# Patient Record
Sex: Female | Born: 1970
Health system: Southern US, Community
[De-identification: ages and names within clinical notes are randomized; demographics above are authoritative.]

## PROBLEM LIST (undated history)

## (undated) DIAGNOSIS — M549 Dorsalgia, unspecified: Secondary | ICD-10-CM

## (undated) DIAGNOSIS — F329 Major depressive disorder, single episode, unspecified: Secondary | ICD-10-CM

## (undated) DIAGNOSIS — M1990 Unspecified osteoarthritis, unspecified site: Secondary | ICD-10-CM

## (undated) DIAGNOSIS — I10 Essential (primary) hypertension: Secondary | ICD-10-CM

## (undated) DIAGNOSIS — Z5189 Encounter for other specified aftercare: Secondary | ICD-10-CM

## (undated) DIAGNOSIS — G8929 Other chronic pain: Secondary | ICD-10-CM

## (undated) DIAGNOSIS — R011 Cardiac murmur, unspecified: Secondary | ICD-10-CM

## (undated) DIAGNOSIS — J189 Pneumonia, unspecified organism: Secondary | ICD-10-CM

## (undated) DIAGNOSIS — K219 Gastro-esophageal reflux disease without esophagitis: Secondary | ICD-10-CM

## (undated) DIAGNOSIS — R7303 Prediabetes: Secondary | ICD-10-CM

## (undated) DIAGNOSIS — G4700 Insomnia, unspecified: Secondary | ICD-10-CM

## (undated) DIAGNOSIS — F32A Depression, unspecified: Secondary | ICD-10-CM

## (undated) DIAGNOSIS — M542 Cervicalgia: Secondary | ICD-10-CM

## (undated) DIAGNOSIS — D649 Anemia, unspecified: Secondary | ICD-10-CM

## (undated) DIAGNOSIS — Z923 Personal history of irradiation: Secondary | ICD-10-CM

## (undated) DIAGNOSIS — Z8719 Personal history of other diseases of the digestive system: Secondary | ICD-10-CM

## (undated) DIAGNOSIS — C50919 Malignant neoplasm of unspecified site of unspecified female breast: Secondary | ICD-10-CM

## (undated) DIAGNOSIS — Z1371 Encounter for nonprocreative screening for genetic disease carrier status: Secondary | ICD-10-CM

## (undated) DIAGNOSIS — M5430 Sciatica, unspecified side: Secondary | ICD-10-CM

## (undated) DIAGNOSIS — F419 Anxiety disorder, unspecified: Secondary | ICD-10-CM

## (undated) DIAGNOSIS — G43909 Migraine, unspecified, not intractable, without status migrainosus: Secondary | ICD-10-CM

## (undated) DIAGNOSIS — G629 Polyneuropathy, unspecified: Secondary | ICD-10-CM

## (undated) HISTORY — DX: Malignant neoplasm of unspecified site of unspecified female breast: C50.919

## (undated) HISTORY — PX: EYE SURGERY: SHX253

## (undated) HISTORY — PX: WISDOM TOOTH EXTRACTION: SHX21

## (undated) HISTORY — PX: OTHER SURGICAL HISTORY: SHX169

## (undated) HISTORY — DX: Migraine, unspecified, not intractable, without status migrainosus: G43.909

## (undated) HISTORY — DX: Encounter for nonprocreative screening for genetic disease carrier status: Z13.71

---

## 2001-04-30 HISTORY — PX: REDUCTION MAMMAPLASTY: SUR839

## 2003-05-01 HISTORY — PX: REFRACTIVE SURGERY: SHX103

## 2003-07-07 ENCOUNTER — Encounter: Admission: RE | Admit: 2003-07-07 | Discharge: 2003-10-05 | Payer: Self-pay | Admitting: *Deleted

## 2006-01-31 ENCOUNTER — Encounter: Admission: RE | Admit: 2006-01-31 | Discharge: 2006-05-01 | Payer: Self-pay | Admitting: Nurse Practitioner

## 2006-04-30 HISTORY — PX: GASTRIC BYPASS: SHX52

## 2006-06-05 ENCOUNTER — Other Ambulatory Visit: Admission: RE | Admit: 2006-06-05 | Discharge: 2006-06-05 | Payer: Self-pay | Admitting: Family Medicine

## 2006-06-25 ENCOUNTER — Ambulatory Visit (HOSPITAL_COMMUNITY): Admission: RE | Admit: 2006-06-25 | Discharge: 2006-06-25 | Payer: Self-pay | Admitting: Interventional Cardiology

## 2006-06-27 ENCOUNTER — Ambulatory Visit (HOSPITAL_COMMUNITY): Admission: RE | Admit: 2006-06-27 | Discharge: 2006-06-27 | Payer: Self-pay | Admitting: Interventional Cardiology

## 2007-07-21 ENCOUNTER — Other Ambulatory Visit: Admission: RE | Admit: 2007-07-21 | Discharge: 2007-07-21 | Payer: Self-pay | Admitting: Family Medicine

## 2010-04-20 ENCOUNTER — Emergency Department (HOSPITAL_BASED_OUTPATIENT_CLINIC_OR_DEPARTMENT_OTHER)
Admission: EM | Admit: 2010-04-20 | Discharge: 2010-04-20 | Payer: Self-pay | Source: Home / Self Care | Admitting: Emergency Medicine

## 2010-04-25 ENCOUNTER — Encounter: Payer: Self-pay | Admitting: Family Medicine

## 2010-04-30 DIAGNOSIS — IMO0001 Reserved for inherently not codable concepts without codable children: Secondary | ICD-10-CM

## 2010-04-30 DIAGNOSIS — Z5189 Encounter for other specified aftercare: Secondary | ICD-10-CM

## 2010-04-30 HISTORY — DX: Reserved for inherently not codable concepts without codable children: IMO0001

## 2010-04-30 HISTORY — DX: Encounter for other specified aftercare: Z51.89

## 2010-05-10 ENCOUNTER — Ambulatory Visit
Admission: RE | Admit: 2010-05-10 | Discharge: 2010-05-10 | Payer: Self-pay | Source: Home / Self Care | Attending: Family Medicine | Admitting: Family Medicine

## 2010-05-10 DIAGNOSIS — R51 Headache: Secondary | ICD-10-CM | POA: Insufficient documentation

## 2010-05-10 DIAGNOSIS — M542 Cervicalgia: Secondary | ICD-10-CM | POA: Insufficient documentation

## 2010-05-10 DIAGNOSIS — G43909 Migraine, unspecified, not intractable, without status migrainosus: Secondary | ICD-10-CM | POA: Insufficient documentation

## 2010-05-10 DIAGNOSIS — R519 Headache, unspecified: Secondary | ICD-10-CM | POA: Insufficient documentation
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
## 2010-06-01 NOTE — Letter (Signed)
Summary: [facility]  [facility]   Imported By: Lanelle Bal 05/23/2010 09:49:28  _____________________________________________________________________  External Attachment:    Type:   Image     Comment:   External Document

## 2010-08-01 ENCOUNTER — Inpatient Hospital Stay (HOSPITAL_COMMUNITY)
Admission: EM | Admit: 2010-08-01 | Discharge: 2010-08-03 | DRG: 812 | Disposition: A | Discharge status: Home / Self Care | Payer: 59 | Attending: Internal Medicine | Admitting: Internal Medicine

## 2010-08-01 ENCOUNTER — Other Ambulatory Visit: Payer: Self-pay | Admitting: Family Medicine

## 2010-08-01 ENCOUNTER — Other Ambulatory Visit (HOSPITAL_COMMUNITY)
Admission: RE | Admit: 2010-08-01 | Discharge: 2010-08-01 | Disposition: A | Discharge status: Home / Self Care | Payer: 59 | Source: Ambulatory Visit | Attending: Family Medicine | Admitting: Family Medicine

## 2010-08-01 DIAGNOSIS — D509 Iron deficiency anemia, unspecified: Principal | ICD-10-CM | POA: Diagnosis present

## 2010-08-01 DIAGNOSIS — N924 Excessive bleeding in the premenopausal period: Secondary | ICD-10-CM | POA: Diagnosis present

## 2010-08-01 DIAGNOSIS — Z124 Encounter for screening for malignant neoplasm of cervix: Secondary | ICD-10-CM | POA: Insufficient documentation

## 2010-08-01 DIAGNOSIS — I10 Essential (primary) hypertension: Secondary | ICD-10-CM | POA: Diagnosis present

## 2010-08-01 DIAGNOSIS — D473 Essential (hemorrhagic) thrombocythemia: Secondary | ICD-10-CM | POA: Diagnosis present

## 2010-08-01 DIAGNOSIS — R87810 Cervical high risk human papillomavirus (HPV) DNA test positive: Secondary | ICD-10-CM | POA: Insufficient documentation

## 2010-08-01 DIAGNOSIS — Z113 Encounter for screening for infections with a predominantly sexual mode of transmission: Secondary | ICD-10-CM | POA: Insufficient documentation

## 2010-08-01 LAB — IRON AND TIBC: UIBC: 239 ug/dL

## 2010-08-01 LAB — DIFFERENTIAL
Basophils Absolute: 0 10*3/uL (ref 0.0–0.1)
Basophils Relative: 0 % (ref 0–1)
Eosinophils Absolute: 0 10*3/uL (ref 0.0–0.7)
Eosinophils Relative: 0 % (ref 0–5)
Lymphs Abs: 2.2 10*3/uL (ref 0.7–4.0)
Monocytes Relative: 10 % (ref 3–12)
Neutro Abs: 2.5 10*3/uL (ref 1.7–7.7)

## 2010-08-01 LAB — PROTIME-INR
INR: 1.13 (ref 0.00–1.49)
Prothrombin Time: 14.7 seconds (ref 11.6–15.2)

## 2010-08-01 LAB — CBC
Hemoglobin: 4 g/dL — CL (ref 12.0–15.0)
MCV: 58 fL — ABNORMAL LOW (ref 78.0–100.0)
WBC: 5.2 10*3/uL (ref 4.0–10.5)

## 2010-08-01 LAB — VITAMIN B12: Vitamin B-12: 341 pg/mL (ref 211–911)

## 2010-08-01 LAB — COMPREHENSIVE METABOLIC PANEL
Albumin: 3.6 g/dL (ref 3.5–5.2)
BUN: 8 mg/dL (ref 6–23)
CO2: 24 mEq/L (ref 19–32)
Chloride: 106 mEq/L (ref 96–112)
Creatinine, Ser: 0.68 mg/dL (ref 0.4–1.2)
GFR calc Af Amer: >60 mL/min (ref >60)
Sodium: 137 mEq/L (ref 135–145)

## 2010-08-01 LAB — FOLATE: Folate: 12.8 ng/mL

## 2010-08-01 LAB — FERRITIN: Ferritin: 2 ng/mL — ABNORMAL LOW (ref 10–291)

## 2010-08-02 ENCOUNTER — Inpatient Hospital Stay (HOSPITAL_COMMUNITY): Payer: 59

## 2010-08-02 LAB — CBC
HCT: 29.9 % — ABNORMAL LOW (ref 36.0–46.0)
MCH: 19.7 pg — ABNORMAL LOW (ref 26.0–34.0)
MCHC: 28.4 g/dL — ABNORMAL LOW (ref 30.0–36.0)
MCV: 69.4 fL — ABNORMAL LOW (ref 78.0–100.0)
Platelets: 975 10*3/uL (ref 150–400)

## 2010-08-02 LAB — DIFFERENTIAL
Basophils Relative: 0 % (ref 0–1)
Eosinophils Relative: 0 % (ref 0–5)

## 2010-08-02 LAB — IRON AND TIBC
Saturation Ratios: 3 % — ABNORMAL LOW (ref 20–55)
UIBC: 428 ug/dL

## 2010-08-02 LAB — TSH: TSH: 0.507 u[IU]/mL (ref 0.350–4.500)

## 2010-08-02 LAB — FERRITIN: Ferritin: 2 ng/mL — ABNORMAL LOW (ref 10–291)

## 2010-08-02 LAB — VITAMIN B12: Vitamin B-12: 339 pg/mL (ref 211–911)

## 2010-08-02 MED ORDER — IOHEXOL 300 MG/ML  SOLN
100.0000 mL | Freq: Once | INTRAMUSCULAR | Status: AC | PRN
  Administered 2010-08-02: 100 mL via INTRAVENOUS

## 2010-08-03 LAB — CBC
Hemoglobin: 7.6 g/dL — ABNORMAL LOW (ref 12.0–15.0)
MCH: 19.4 pg — ABNORMAL LOW (ref 26.0–34.0)
MCHC: 28.4 g/dL — ABNORMAL LOW (ref 30.0–36.0)
Platelets: 826 10*3/uL — ABNORMAL HIGH (ref 150–400)

## 2010-08-03 LAB — DIFFERENTIAL
Basophils Absolute: 0 10*3/uL (ref 0.0–0.1)
Basophils Relative: 0 % (ref 0–1)
Eosinophils Absolute: 0.1 10*3/uL (ref 0.0–0.7)
Lymphocytes Relative: 48 % — ABNORMAL HIGH (ref 12–46)
Monocytes Absolute: 0.5 10*3/uL (ref 0.1–1.0)
Neutro Abs: 2.5 10*3/uL (ref 1.7–7.7)
Neutrophils Relative %: 43 % (ref 43–77)

## 2010-08-03 LAB — COMPREHENSIVE METABOLIC PANEL
ALT: 25 U/L (ref 0–35)
CO2: 27 mEq/L (ref 19–32)
Calcium: 8.4 mg/dL (ref 8.4–10.5)
Chloride: 105 mEq/L (ref 96–112)
Creatinine, Ser: 0.81 mg/dL (ref 0.4–1.2)
GFR calc non Af Amer: >60 mL/min (ref >60)
Glucose, Bld: 85 mg/dL (ref 70–99)
Total Bilirubin: 0.5 mg/dL (ref 0.3–1.2)

## 2010-08-03 LAB — PREPARE RBC (CROSSMATCH)

## 2010-08-03 LAB — HEMOGLOBIN AND HEMATOCRIT, BLOOD: Hemoglobin: 8.8 g/dL — ABNORMAL LOW (ref 12.0–15.0)

## 2010-08-03 LAB — MAGNESIUM: Magnesium: 2.1 mg/dL (ref 1.5–2.5)

## 2010-08-03 NOTE — Discharge Summary (Signed)
NAMESHANEIKA, ROSSA NO.:  [id number]  MEDICAL RECORD NO.:  [id number]           PATIENT TYPE:  I  LOCATION:  1309                         FACILITY:  [facility]  PHYSICIAN:  Talmage Nap, MD  DATE OF BIRTH:  04-Jun-1970  DATE OF ADMISSION:  08/01/2010 DATE OF DISCHARGE:  08/03/2010                        DISCHARGE SUMMARY - REFERRING   PRIMARY CARE PHYSICIAN:  [name], MD  DISCHARGE DIAGNOSES: 1. Microcytic anemia status post multiple packed red blood cells     transfusion. 2. History of menorrhagia.  Differential:     a.     Uterine fibroid.     b.     Dysfunctional uterine bleed.     c.     Hemoglobinopathy.     d.     Von Willebrand disease. 3. Thrombocytosis most likely secondary to anemia. 4. Hypertension. 5. Migraine headaches. 6. Allergies.  HISTORY:  The patient is a 40 year old African American female with history of menorrhagia who has been put by the gynecologist on birth control pills, was admitted to the hospital on August 01, 2010 by Dr. Homero Fellers because the patient on routine blood work was found to have a hemoglobin of about 4 g.  She was also said to have complained about lightheadedness and being weak.  There was no history of chest pain.  There was no history of shortness of breath.  She denies any history of hematochezia, hematemesis or melanotic stool, and she also denies any history of bleeding per vagina.  She was however sent to the hospital for blood transfusion and further workup.  Her preadmission meds include Zyrtec, Singulair and amitriptyline.  ALLERGIES:  She has no known allergy.  SOCIAL HISTORY:  Negative for alcohol or tobacco use.  FAMILY HISTORY:  [place] Spaniel to be positive for diabetes mellitus and CVA.  REVIEW OF SYSTEMS:  Essentially documented in the initial history and physical.  PHYSICAL EXAMINATION:  GENERAL:  At the time the patient was seen by the admitting physician, blood pressure was 142/83,  pulse 84, respiratory rate 20, temperature was 98.2 and she was said to be saturating 100% on room air. HEENT:  Pallor but pupils are reactive to light and extraocular muscles are intact. NECK:  No jugular venous distention.  No carotid bruit.  No lymphadenopathy. RESPIRATORY:  Chest is clear to auscultation. CARDIAC:  Heart sounds are 1 and 2. ABDOMEN:  Soft and nontender.  Liver, spleen and kidney not palpable. Bowel sounds are positive. EXTREMITIES:  No pedal edema. NEUROLOGIC:  Nonfocal. MUSCULOSKELETAL:  Unremarkable. SKIN:  Showed normal turgor.  LABORATORY DATA:  Initial fecal occult blood test was negative. Comprehensive metabolic panel showed sodium of 137, potassium of 3.7, chloride of 106 with a bicarb of 24, glucose is 91, BUN is 8, creatinine 0.68.  LFTs normal.  Complete blood count showed WBC of 5.2, hemoglobin of 4.0, hematocrit of 16.4, MCV of 58.0 with platelet count of 1173. LDH is 126, normal.  Coagulation profile showed PT 14.7, INR 1.13 and APTT of 30.  Anemia panel showed serum iron less than 10, UIBC 239 and normal.  Vitamin B12 341  and folate is 12.8 and normal.  Retic percent 31.4 and absolute reticulocyte count is 43.4 and normal.  TSH 0.507, normal.  Vitamin B12 339.  Ferritin level is 2, it is low.  Pathology smear review showed marked microcytic anemia with mild thrombocytosis. Repeat complete blood count with differential done on August 18, 2010 showed WBC of 5.7, hemoglobin 7.6, hematocrit 26.8, MCV of 68.5 with platelet count of 826.  Magnesium level is 2.1 and comprehensive metabolic panel shows sodium of 130, potassium of 3.5, chloride of 130, bicarb of 27, glucose is 85, BUN is 9, creatinine 0.81.  Imaging studies done include CT of the abdomen and pelvis which showed 3.7-cm cyst within the left ovary, which likely represented a function of ovarian cyst.  Endometrium said to be ill defined likely related to menses and there is mild periportal  edema likely secondary to IV resuscitation and cleft-like lesion within the liver, questionable trauma.  HOSPITAL COURSE:  The patient was admitted to general medical floor. She was typed and crossed, transfused with 4 units of packed RBCs and given Lasix 40 mg IV in between transfusion.  She was also given Pepcid 20 mg p.o. daily and hydrochlorothiazide 12.5 mg p.o. daily for blood pressure control.  The patient was also given Ambien 5 mg p.o. q.h.s. p.r.n. for insomnia.  The patient was however seen by me for the very first time in this index admission on August 02, 2010 and during this encounter she denied any lightheadedness.  No fever, no chills, no rigor.  She claims she is mildly weak, and examination of the patient was essentially unremarkable.  The patient was subsequently started on ferrous sulfate 325 mg p.o. b.i.d.  She was reevaluated by me today and complained about mild lightheadedness.  No chest pain.  No shortness of breath.  Denied any hematochezia or melena.  No bleeding per vagina, and she wants to go home today.  Examination of the patient was essentially unremarkable.  Her vital signs blood pressure is 117/73, pulse 50, respiratory rate 14, temperature is 98.2.  The patient is medically stable.  Plan is for the patient to be discharged home today after transfusion with 1 unit of packed RBCs and post transfusion hemoglobin and hematocrit done.  Diet will be low sodium, low cholesterol.  She was advised to follow up with her OB/GYN, and the patient states she will make her own appointment.  OB-GYN is to consider possible hysteroscopy plus biopsy.  DISCHARGE MEDICATIONS:  Medications to be taken at home will include: 1. Ferrous sulfate 325 mg 1 p.o. t.i.d. 2. Hydrochlorothiazide 12.5 mg 1 p.o. daily. 3. Advil 200 mg p.o. q. 6 h. p.r.n. for pain. 4. Amitriptyline 25 mg 1 p.o. daily at bedtime for migraine headaches. 5. Flonase (fluticasone propionate) nasal spray, 1  spray in both     nostrils daily p.r.n. 6. Singulair (montelukast) 10 mg 1 p.o. daily. 7. Zyrtec (cetirizine) 10 mg 1 p.o. daily at bedtime.     Talmage Nap, MD     CN/MEDQ  D:  08/03/2010  T:  08/03/2010  Job:  [id number]  cc:   [name], MD Fax: [phone]  Electronically Signed by Talmage Nap  on 08/03/2010 04:21:20 PM

## 2010-08-04 LAB — TYPE AND SCREEN
ABO/RH(D): O NEG
Antibody Screen: NEGATIVE
Unit division: 0
Unit division: 0
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
## 2010-08-25 ENCOUNTER — Other Ambulatory Visit: Payer: Self-pay | Admitting: Obstetrics and Gynecology

## 2010-08-25 ENCOUNTER — Other Ambulatory Visit (HOSPITAL_COMMUNITY)
Admission: RE | Admit: 2010-08-25 | Discharge: 2010-08-25 | Disposition: A | Discharge status: Home / Self Care | Payer: 59 | Source: Ambulatory Visit | Attending: Obstetrics and Gynecology | Admitting: Obstetrics and Gynecology

## 2010-08-25 ENCOUNTER — Encounter (INDEPENDENT_AMBULATORY_CARE_PROVIDER_SITE_OTHER): Payer: 59 | Admitting: Obstetrics and Gynecology

## 2010-08-25 DIAGNOSIS — N72 Inflammatory disease of cervix uteri: Secondary | ICD-10-CM | POA: Insufficient documentation

## 2010-08-25 DIAGNOSIS — R87610 Atypical squamous cells of undetermined significance on cytologic smear of cervix (ASC-US): Secondary | ICD-10-CM

## 2010-08-25 DIAGNOSIS — N879 Dysplasia of cervix uteri, unspecified: Secondary | ICD-10-CM | POA: Insufficient documentation

## 2010-09-22 ENCOUNTER — Ambulatory Visit: Payer: 59 | Admitting: Obstetrics and Gynecology

## 2010-09-22 DIAGNOSIS — R87610 Atypical squamous cells of undetermined significance on cytologic smear of cervix (ASC-US): Secondary | ICD-10-CM

## 2010-09-22 NOTE — Group Therapy Note (Signed)
NAME:  Tammie [name], Tammie [name] NO.:  [id number]  MEDICAL RECORD NO.:  [id number]           PATIENT TYPE:  A  LOCATION:  [facility]                   FACILITY:  [facility]  PHYSICIAN:  Catalina Antigua, MD     DATE OF BIRTH:  December 14, 1970  DATE OF SERVICE:  09/22/2010                                 CLINIC NOTE  This is a 40 year old who presents today to discuss the results of her colposcopy.  The patient had an abnormal Pap smear of ASCUS positive HPV in April 2012 followed by colposcopy performed in August 25, 2010, which showed acute and chronic cervicitis and focal koilocytic atypia, ECC had benign endocervical glands.  The patient was informed of these findings, and plan is to repeat the Pap smear in 6 months.  The patient also reports a history of heavy vaginal bleeding lasting 11 days.  The patient is currently being managed by her primary care physician with a monophasic birth control pills.  Discussion of other options, such as Mirena IUD or endometrial ablation also discussed.  The patient is more interested in the Mirena IUD information packet was provided.  The patient was also informed that with her history of hypertension continued use of her oral contraceptive pills is associated with coronary artery disease.  The patient verbalized understanding and understands that her OCPs will need to be discontinued.  The patient will therefore return for a repeat Pap smear in 6 months and IUD insertion.          ______________________________ Catalina Antigua, MD    PC/MEDQ  D:  09/22/2010  T:  09/22/2010  Job:  [id number]

## 2010-10-05 ENCOUNTER — Other Ambulatory Visit: Payer: Self-pay | Admitting: Obstetrics & Gynecology

## 2010-10-05 ENCOUNTER — Other Ambulatory Visit (HOSPITAL_COMMUNITY)
Admission: RE | Admit: 2010-10-05 | Discharge: 2010-10-05 | Disposition: A | Discharge status: Home / Self Care | Payer: 59 | Source: Ambulatory Visit | Attending: Obstetrics & Gynecology | Admitting: Obstetrics & Gynecology

## 2010-10-05 ENCOUNTER — Ambulatory Visit: Payer: 59 | Admitting: Obstetrics and Gynecology

## 2010-10-05 ENCOUNTER — Other Ambulatory Visit: Payer: Self-pay | Admitting: Obstetrics and Gynecology

## 2010-10-05 DIAGNOSIS — Z30430 Encounter for insertion of intrauterine contraceptive device: Secondary | ICD-10-CM

## 2010-10-05 DIAGNOSIS — N949 Unspecified condition associated with female genital organs and menstrual cycle: Secondary | ICD-10-CM | POA: Insufficient documentation

## 2010-10-05 DIAGNOSIS — N938 Other specified abnormal uterine and vaginal bleeding: Secondary | ICD-10-CM | POA: Insufficient documentation

## 2010-10-05 DIAGNOSIS — D649 Anemia, unspecified: Secondary | ICD-10-CM

## 2010-10-05 LAB — POCT PREGNANCY, URINE: Preg Test, Ur: NEGATIVE

## 2010-10-06 NOTE — Group Therapy Note (Signed)
NAME:  Tammie [name], Tammie [name] NO.:  [id number]  MEDICAL RECORD NO.:  [id number]           PATIENT TYPE:  A  LOCATION:  [facility]                   FACILITY:  [facility]  PHYSICIAN:  Allie Bossier, MD        DATE OF BIRTH:  February 12, 1971  DATE OF SERVICE:  10/05/2010                                 CLINIC NOTE  Ms. Bellows is a 40 year old African American gravida 1, para 1 with a daughter who has graduated from high school [employer]).  She comes in today for treatment of her dysfunctional uterine bleeding.  Please note that she has a history of dysfunctional uterine bleeding, which has led to blood transfusion.  She says her hemoglobin has gotten as low as 4, but with the help of transfusions and iron, she says that she is recently up to 9.  She has been evaluated in the past with a CT done August 02, 2010, that showed a 3.7-cm simple left ovarian cyst, and her endometrium was described as ill-defined.  There were no other abnormalities noted on that CT.  On exam today, uterus is upper limits of normal for size, not particularly tender, relatively globular consistent with adenomyosis.  So today, I have consented her for an endometrial biopsy as well as the Mirena IUD placement.  After bimanual exam, I prepped the cervix with iodine and sounded her uterus to 8 cm. I did an endometrial Pipelle biopsy with 2 passes, and I obtained a moderate to large amount of tissue.  I placed a Mirena IUD and cut the strings to approximately 3 cm.  She tolerated the procedure well.  She was given 800 of Motrin, and she will follow up in 4 weeks for an IUD string check.     Allie Bossier, MD    MCD/MEDQ  D:  10/05/2010  T:  10/06/2010  Job:  [id number]

## 2010-10-20 ENCOUNTER — Other Ambulatory Visit: Payer: 59

## 2010-10-20 DIAGNOSIS — Z0189 Encounter for other specified special examinations: Secondary | ICD-10-CM

## 2010-11-03 ENCOUNTER — Ambulatory Visit: Payer: 59 | Admitting: Obstetrics and Gynecology

## 2010-11-03 DIAGNOSIS — Z30431 Encounter for routine checking of intrauterine contraceptive device: Secondary | ICD-10-CM

## 2010-11-03 NOTE — Group Therapy Note (Unsigned)
NAME:  Tammie [name], Tammie [name] NO.:  [id number]  MEDICAL RECORD NO.:  [id number]           PATIENT TYPE:  A  LOCATION:  [facility]                   FACILITY:  [facility]  PHYSICIAN:  Georges Mouse, CNM   DATE OF BIRTH:  01-Jan-1971  DATE OF SERVICE:  11/03/2010                                 CLINIC NOTE  She was seen in the GYN Clinic today with a chief complaint of IUD string check.  Ms. Lineberry was seen in clinic today for an IUD string checked that was placed by Dr. Marice Potter on October 05, 2010, for dysfunctional uterine bleeding.  The patient reports ongoing heavy and long menstrual bleeding since the insertion reporting 28 days of bleeding most recently.  She also reports mild occasional left pelvic pain and requests a review of the results from her Pap smear, colposcopy and endometrial biopsies.  PAST MEDICAL HISTORY:  No change from previous visits with a history of severe anemia requiring transfusion, which insertion of IUD.  GYN HISTORY:  She has last Pap in March 2012, showed ascus and positive HPV, colposcopy.  Her last colposcopy on April 2012 showed inflammation and chronic cervicitis.  Dr. Jolayne Panther reviewed and recommended to follow up Pap in 6 months from that time.  EMB showed no atypia or hyperplasia.  MEDICATIONS:  See the patient list.  ALLERGIES:  No known diagnosed allergies to drugs.  OBJECTIVE:  GENERAL:  Pleasant, well-spoken African American female appearing her stated age and in no acute distress. VITAL SIGNS:  Temperature 97.9, pulse 77, blood pressure 116/79, weight 168.9 pounds, height 5.6 and three-quarters. SKIN:  Warm and dry to the touch.  No signs of petechiae or ecchymoses on the extremities. NECK:  Trachea is midline. ABDOMEN:  Soft, nondistended, nontender to touch. PELVIC:  External genitalia without lesion or cyst.  Rugae pink without lesions.  No discharge.  IUD string visualized about 3 cm and coming out of the cervical  os. NEUROLOGIC:  Alert and oriented x4, pleasant and energetic mood and affect.  The patient states "I am ready to be normal again."  Normal coordination and speech.  ASSESSMENT:  A 40 year old African American female G1, P1 with IUD in place for dysfunctional uterine bleeding.  PLAN: 1. The patient to follow up in 3 months for repeat Pap and     reassessment of abnormal bleeding. 2. Reviewed normal side effects of IUD placement including irregular     bleeding for the first 3-4 months with the patient. 3. Advised the patient to report to the MAU for any excessive     menstrual reviewed bleeding and to call the office is concerned     about the amount of heavy or prolonged bleeding if it continuous. 4. Continue iron supplementation. 5. Reviewed the results of previous procedures with the patient.          ______________________________ Georges Mouse, CNM    NF/MEDQ  D:  11/03/2010  T:  11/03/2010  Job:  [id number]

## 2011-01-29 ENCOUNTER — Ambulatory Visit: Payer: 59 | Admitting: Family Medicine

## 2011-01-30 ENCOUNTER — Encounter (HOSPITAL_COMMUNITY): Payer: Self-pay

## 2011-01-30 ENCOUNTER — Encounter (HOSPITAL_COMMUNITY)
Admission: RE | Admit: 2011-01-30 | Discharge: 2011-01-30 | Disposition: A | Discharge status: Home / Self Care | Payer: 59 | Source: Ambulatory Visit | Attending: Obstetrics and Gynecology | Admitting: Obstetrics and Gynecology

## 2011-01-30 ENCOUNTER — Other Ambulatory Visit: Payer: Self-pay

## 2011-01-30 HISTORY — DX: Anemia, unspecified: D64.9

## 2011-01-30 HISTORY — DX: Cardiac murmur, unspecified: R01.1

## 2011-01-30 HISTORY — DX: Encounter for other specified aftercare: Z51.89

## 2011-01-30 HISTORY — DX: Essential (primary) hypertension: I10

## 2011-01-30 LAB — CBC
HCT: 33.4 % — ABNORMAL LOW (ref 36.0–46.0)
Hemoglobin: 10.4 g/dL — ABNORMAL LOW (ref 12.0–15.0)
MCH: 23.6 pg — ABNORMAL LOW (ref 26.0–34.0)
MCHC: 31.1 g/dL (ref 30.0–36.0)
MCV: 75.7 fL — ABNORMAL LOW (ref 78.0–100.0)
RDW: 14.2 % (ref 11.5–15.5)

## 2011-01-30 LAB — BASIC METABOLIC PANEL
BUN: 14 mg/dL (ref 6–23)
CO2: 24 mEq/L (ref 19–32)
Calcium: 8.7 mg/dL (ref 8.4–10.5)
Chloride: 103 mEq/L (ref 96–112)
Creatinine, Ser: 0.66 mg/dL (ref 0.50–1.10)
Glucose, Bld: 87 mg/dL (ref 70–99)

## 2011-01-30 NOTE — Patient Instructions (Signed)
   Your procedure is scheduled on: Wed 01/31/11  Enter through the Main Entrance of [facility] at:9 am Pick up the phone at the desk and dial [phone] and inform us of your arrival  Please call this number if you have any problems the morning of surgery: [phone]  Remember: Do not eat food after midnight tonight Do not drink clear liquids after:tonight Take these medicines the morning of surgery with a SIP OF WATER: BP med  Do not wear jewelry, make-up, or FINGER nail polish Do not wear lotions, powders, or perfumes.  You may wear deodorant. Do not shave 48 hours prior to surgery. Do not bring valuables to the hospital. Leave suitcase in the car. After Surgery it may be brought to your room. For patients being admitted to the hospital, checkout time is 11:00am the day of discharge.  Patients discharged on the day of surgery will not be allowed to drive home.   Name and phone number of your driver: sisterVeneda [name]- [phone]    Remember to use your hibiclens as instructed.Please shower with 1/2 bottle the evening before your surgery and the other 1/2 bottle the morning of surgery.

## 2011-01-31 ENCOUNTER — Other Ambulatory Visit: Payer: Self-pay | Admitting: Obstetrics and Gynecology

## 2011-01-31 ENCOUNTER — Encounter (HOSPITAL_COMMUNITY)
Admission: RE | Disposition: A | Discharge status: Home / Self Care | Payer: Self-pay | Source: Ambulatory Visit | Attending: Obstetrics and Gynecology

## 2011-01-31 ENCOUNTER — Inpatient Hospital Stay (HOSPITAL_COMMUNITY): Payer: 59

## 2011-01-31 ENCOUNTER — Ambulatory Visit (HOSPITAL_COMMUNITY)
Admission: RE | Admit: 2011-01-31 | Discharge: 2011-02-01 | Disposition: A | Discharge status: Home / Self Care | Payer: 59 | Source: Ambulatory Visit | Attending: Obstetrics and Gynecology | Admitting: Obstetrics and Gynecology

## 2011-01-31 ENCOUNTER — Encounter (HOSPITAL_COMMUNITY): Payer: Self-pay

## 2011-01-31 DIAGNOSIS — N920 Excessive and frequent menstruation with regular cycle: Principal | ICD-10-CM | POA: Insufficient documentation

## 2011-01-31 DIAGNOSIS — D500 Iron deficiency anemia secondary to blood loss (chronic): Secondary | ICD-10-CM | POA: Insufficient documentation

## 2011-01-31 DIAGNOSIS — Z01812 Encounter for preprocedural laboratory examination: Secondary | ICD-10-CM | POA: Insufficient documentation

## 2011-01-31 DIAGNOSIS — Z90710 Acquired absence of both cervix and uterus: Secondary | ICD-10-CM

## 2011-01-31 DIAGNOSIS — Z01818 Encounter for other preprocedural examination: Secondary | ICD-10-CM | POA: Insufficient documentation

## 2011-01-31 HISTORY — PX: VAGINAL HYSTERECTOMY: SHX2639

## 2011-01-31 LAB — PREGNANCY, URINE: Preg Test, Ur: NEGATIVE

## 2011-01-31 LAB — TYPE AND SCREEN: ABO/RH(D): O NEG

## 2011-01-31 SURGERY — HYSTERECTOMY, VAGINAL
Anesthesia: General | Site: Vagina | Wound class: Clean Contaminated

## 2011-01-31 MED ORDER — EPHEDRINE SULFATE 50 MG/ML IJ SOLN
INTRAMUSCULAR | Status: DC | PRN
  Administered 2011-01-31: 10 mg via INTRAVENOUS

## 2011-01-31 MED ORDER — VASOPRESSIN 20 UNIT/ML IJ SOLN
INTRAVENOUS | Status: DC | PRN
  Administered 2011-01-31: 11:00:00 via INTRAMUSCULAR

## 2011-01-31 MED ORDER — DEXAMETHASONE SODIUM PHOSPHATE 10 MG/ML IJ SOLN
INTRAMUSCULAR | Status: AC
  Filled 2011-01-31: qty 1

## 2011-01-31 MED ORDER — MORPHINE SULFATE 4 MG/ML IJ SOLN
INTRAMUSCULAR | Status: AC
  Administered 2011-01-31: 2 mg via INTRAVENOUS
  Filled 2011-01-31: qty 1

## 2011-01-31 MED ORDER — GLYCOPYRROLATE 0.2 MG/ML IJ SOLN
INTRAMUSCULAR | Status: DC | PRN
  Administered 2011-01-31 (×2): 0.2 mg via INTRAVENOUS

## 2011-01-31 MED ORDER — MORPHINE SULFATE (PF) 1 MG/ML IV SOLN
INTRAVENOUS | Status: DC
  Administered 2011-01-31: 21 mg via INTRAVENOUS
  Administered 2011-01-31: 15 mL via INTRAVENOUS
  Administered 2011-01-31 (×2): 25 mg via INTRAVENOUS
  Administered 2011-02-01: 1 mg via INTRAVENOUS
  Administered 2011-02-01: 2 mg via INTRAVENOUS
  Filled 2011-01-31 (×2): qty 25

## 2011-01-31 MED ORDER — ROCURONIUM BROMIDE 50 MG/5ML IV SOLN
INTRAVENOUS | Status: AC
  Filled 2011-01-31: qty 1

## 2011-01-31 MED ORDER — ONDANSETRON HCL 4 MG/2ML IJ SOLN
INTRAMUSCULAR | Status: AC
  Filled 2011-01-31: qty 2

## 2011-01-31 MED ORDER — PROPOFOL 10 MG/ML IV EMUL
INTRAVENOUS | Status: AC
  Filled 2011-01-31: qty 20

## 2011-01-31 MED ORDER — DIPHENHYDRAMINE HCL 12.5 MG/5ML PO ELIX: 12.5000 mg | ORAL_SOLUTION | Freq: Four times a day (QID) | ORAL | Status: DC | PRN

## 2011-01-31 MED ORDER — LIDOCAINE HCL (CARDIAC) 20 MG/ML IV SOLN
INTRAVENOUS | Status: DC | PRN
  Administered 2011-01-31: 20 mg via INTRAVENOUS

## 2011-01-31 MED ORDER — GLYCOPYRROLATE 0.2 MG/ML IJ SOLN
INTRAMUSCULAR | Status: AC
  Filled 2011-01-31: qty 1

## 2011-01-31 MED ORDER — MIDAZOLAM HCL 2 MG/2ML IJ SOLN
INTRAMUSCULAR | Status: AC
  Filled 2011-01-31: qty 2

## 2011-01-31 MED ORDER — CEFAZOLIN SODIUM 1-5 GM-% IV SOLN
INTRAVENOUS | Status: AC
  Filled 2011-01-31: qty 50

## 2011-01-31 MED ORDER — MIDAZOLAM HCL 5 MG/5ML IJ SOLN
INTRAMUSCULAR | Status: DC | PRN
  Administered 2011-01-31: 2 mg via INTRAVENOUS

## 2011-01-31 MED ORDER — MORPHINE SULFATE 4 MG/ML IJ SOLN
1.0000 mg | INTRAMUSCULAR | Status: DC | PRN
  Administered 2011-01-31 (×2): 2 mg via INTRAVENOUS

## 2011-01-31 MED ORDER — FENTANYL CITRATE 0.05 MG/ML IJ SOLN
INTRAMUSCULAR | Status: AC
  Filled 2011-01-31: qty 5

## 2011-01-31 MED ORDER — NALOXONE HCL 0.4 MG/ML IJ SOLN: 0.4000 mg | INTRAMUSCULAR | Status: DC | PRN

## 2011-01-31 MED ORDER — DIPHENHYDRAMINE HCL 50 MG/ML IJ SOLN: 12.5000 mg | Freq: Four times a day (QID) | INTRAMUSCULAR | Status: DC | PRN

## 2011-01-31 MED ORDER — DEXAMETHASONE SODIUM PHOSPHATE 4 MG/ML IJ SOLN
INTRAMUSCULAR | Status: DC | PRN
  Administered 2011-01-31: 10 mg via INTRAVENOUS

## 2011-01-31 MED ORDER — FENTANYL CITRATE 0.05 MG/ML IJ SOLN
INTRAMUSCULAR | Status: DC | PRN
  Administered 2011-01-31: 100 ug via INTRAVENOUS
  Administered 2011-01-31: 50 ug via INTRAVENOUS

## 2011-01-31 MED ORDER — PROPOFOL 10 MG/ML IV EMUL
INTRAVENOUS | Status: DC | PRN
  Administered 2011-01-31: 200 mg via INTRAVENOUS

## 2011-01-31 MED ORDER — ONDANSETRON HCL 4 MG/2ML IJ SOLN
INTRAMUSCULAR | Status: DC | PRN
  Administered 2011-01-31: 4 mg via INTRAVENOUS

## 2011-01-31 MED ORDER — KETOROLAC TROMETHAMINE 30 MG/ML IJ SOLN
INTRAMUSCULAR | Status: DC | PRN
  Administered 2011-01-31: 30 mg via INTRAVENOUS

## 2011-01-31 MED ORDER — KETOROLAC TROMETHAMINE 30 MG/ML IJ SOLN
INTRAMUSCULAR | Status: AC
  Filled 2011-01-31: qty 1

## 2011-01-31 MED ORDER — LACTATED RINGERS IV SOLN
INTRAVENOUS | Status: DC
  Administered 2011-01-31 (×2): via INTRAVENOUS

## 2011-01-31 MED ORDER — LACTATED RINGERS IV SOLN
INTRAVENOUS | Status: DC
  Administered 2011-01-31 – 2011-02-01 (×2): via INTRAVENOUS

## 2011-01-31 MED ORDER — EPHEDRINE 5 MG/ML INJ
INTRAVENOUS | Status: AC
  Filled 2011-01-31: qty 10

## 2011-01-31 MED ORDER — MICROFIBRILLAR COLL HEMOSTAT EX POWD
CUTANEOUS | Status: DC | PRN
  Administered 2011-01-31: 1 g via TOPICAL

## 2011-01-31 MED ORDER — ONDANSETRON HCL 4 MG/2ML IJ SOLN
4.0000 mg | Freq: Four times a day (QID) | INTRAMUSCULAR | Status: DC | PRN
  Administered 2011-01-31: 4 mg via INTRAVENOUS
  Filled 2011-01-31: qty 2

## 2011-01-31 MED ORDER — CEFAZOLIN SODIUM 1-5 GM-% IV SOLN
1.0000 g | INTRAVENOUS | Status: AC
  Administered 2011-01-31: 1 g via INTRAVENOUS

## 2011-01-31 MED ORDER — SODIUM CHLORIDE 0.9 % IJ SOLN: 9.0000 mL | INTRAMUSCULAR | Status: DC | PRN

## 2011-01-31 MED ORDER — LIDOCAINE HCL (CARDIAC) 20 MG/ML IV SOLN
INTRAVENOUS | Status: AC
  Filled 2011-01-31: qty 5

## 2011-01-31 SURGICAL SUPPLY — 21 items
CANISTER SUCTION 2500CC (MISCELLANEOUS) ×2 IMPLANT
CLOTH BEACON ORANGE TIMEOUT ST (SAFETY) ×2 IMPLANT
CONT PATH 16OZ SNAP LID 3702 (MISCELLANEOUS) IMPLANT
DRAPE STERI URO 9X17 APER PCH (DRAPES) ×2 IMPLANT
DRAPE UTILITY XL STRL (DRAPES) ×2 IMPLANT
ELECT BLADE 6 FLAT ULTRCLN (ELECTRODE) ×2 IMPLANT
GLOVE BIO SURGEON STRL SZ 6.5 (GLOVE) ×2 IMPLANT
GLOVE BIO SURGEON STRL SZ7 (GLOVE) ×2 IMPLANT
GLOVE BIOGEL PI IND STRL 7.0 (GLOVE) ×1 IMPLANT
GLOVE BIOGEL PI INDICATOR 7.0 (GLOVE) ×1
GOWN PREVENTION PLUS LG XLONG (DISPOSABLE) ×6 IMPLANT
GOWN PREVENTION PLUS XLARGE (GOWN DISPOSABLE) IMPLANT
NS IRRIG 1000ML POUR BTL (IV SOLUTION) ×2 IMPLANT
PACK VAGINAL WOMENS (CUSTOM PROCEDURE TRAY) ×2 IMPLANT
SUT VIC AB 0 CT1 18XCR BRD8 (SUTURE) ×2 IMPLANT
SUT VIC AB 0 CT1 36 (SUTURE) ×4 IMPLANT
SUT VIC AB 0 CT1 8-18 (SUTURE) ×2
SUT VICRYL 0 TIES 12 18 (SUTURE) ×2 IMPLANT
TOWEL OR 17X24 6PK STRL BLUE (TOWEL DISPOSABLE) ×4 IMPLANT
TRAY FOLEY CATH 14FR (SET/KITS/TRAYS/PACK) ×2 IMPLANT
WATER STERILE IRR 1000ML POUR (IV SOLUTION) ×2 IMPLANT

## 2011-01-31 NOTE — Transfer of Care (Signed)
Immediate Anesthesia Transfer of Care Note  Patient: Tammie [name]  Procedure(s) Performed:  HYSTERECTOMY VAGINAL  Patient Location: PACU  Anesthesia Type: General  Level of Consciousness: awake, alert  and oriented  Airway & Oxygen Therapy: Patient Spontanous Breathing and Patient connected to nasal cannula oxygen  Post-op Assessment: Report given to PACU RN and Post -op Vital signs reviewed and stable  Post vital signs: Reviewed and stable  Complications: No apparent anesthesia complications

## 2011-01-31 NOTE — Anesthesia Preprocedure Evaluation (Signed)
Anesthesia Evaluation  Name, MR# and DOB Patient awake  General Assessment Comment  Reviewed: Allergy & Precautions, H&P , Patient's Chart, lab work & pertinent test results, reviewed documented beta blocker date and time   Airway Mallampati: II TM Distance: >3 FB and <3 FB Neck ROM: full   Comment: slightly small chin and TM distance. Dental No notable dental hx.    Pulmonary  clear to auscultation  Pulmonary exam normal       Cardiovascular hypertension (on meds, well controlled), On Medications Valvular problems/murmurs: no signif Murmur noted on PE. regular Normal    Neuro/Psych    GI/Hepatic   Endo/Other    Renal/GU      Musculoskeletal   Abdominal   Peds  Hematology   Anesthesia Other Findings   Reproductive/Obstetrics                           Anesthesia Physical Anesthesia Plan  ASA: II  Anesthesia Plan: General   Post-op Pain Management:    Induction: Intravenous  Airway Management Planned: LMA  Additional Equipment:   Intra-op Plan:   Post-operative Plan:   Informed Consent: I have reviewed the patients History and Physical, chart, labs and discussed the procedure including the risks, benefits and alternatives for the proposed anesthesia with the patient or authorized representative who has indicated his/her understanding and acceptance.   Dental Advisory Given  Plan Discussed with: CRNA and Surgeon  Anesthesia Plan Comments: (  Discussed  general anesthesia, including possible nausea, instrumentation of airway, sore throat,pulmonary aspiration, etc. I asked if the were any outstanding questions, or  concerns before we proceeded. )        Anesthesia Quick Evaluation

## 2011-01-31 NOTE — Anesthesia Procedure Notes (Signed)
Procedure Name: LMA Insertion Date/Time: 01/31/2011 10:34 AM Performed by: Lincoln Brigham Pre-anesthesia Checklist: Patient identified, Emergency Drugs available, Suction available and Patient being monitored Patient Re-evaluated:Patient Re-evaluated prior to inductionOxygen Delivery Method: Circle System Utilized Preoxygenation: Pre-oxygenation with 100% oxygen Intubation Type: IV induction Ventilation: Mask ventilation without difficulty LMA: LMA inserted LMA Size: 4.0

## 2011-01-31 NOTE — H&P (Signed)
No changes to H&P.

## 2011-01-31 NOTE — Op Note (Signed)
Tammie Tammie [name], Tammie [name] NO.:  [id number]  MEDICAL RECORD NO.:  [id number]  LOCATION:  9317                          FACILITY:  [facility]  PHYSICIAN:  Pieter Partridge, MD   DATE OF BIRTH:  [birth year]  DATE OF PROCEDURE:  01/31/2011 DATE OF DISCHARGE:                              OPERATIVE REPORT   PROCEDURE:  Total vaginal hysterectomy.  PREOPERATIVE DIAGNOSES:  Menometrorrhagia and anemia.  POSTOPERATIVE DIAGNOSES:  Menometrorrhagia and anemia.  SURGEON:  Pieter Partridge, MD.  ASSISTANT:  Dr. Richardson Dopp and technician.  ANESTHESIA:  General (LMA).  IV FLUIDS:  1000.  URINE OUT:  600.  BLOOD:  100.  No blood administered.  Foley catheter in place.  LOCAL MEDICATION:  Pitressin.  SPECIMEN:  Uterus with cervix.  DISPOSITION OF SPECIMEN:  To path.  COUNTS CORRECT:  Yes.  The patient is stable to PACU.  PROCEDURE IN DETAIL:  Tammie Tammie [name] was identified in the holding area. She was then taken to the operating room with IV running.  She was placed in the dorsal supine position and underwent LMA anesthesia without complication.  She was then placed in the dorsal lithotomy position and prepped and draped in a normal sterile fashion with Betadine.  A weighted speculum was placed in the vagina.  The cervix was grasped with two single-tooth tenaculum.  Pitressin was injected paracervically. The Bovie cautery was then used to transect the cervix from the vaginal mucosa.  The cervix was displaced anteriorly and I attempted to open up the posterior fascia and I started to dig into the uterus.  The Allis was used to grasp the uterine tissue and then I got into the posterior cul- de-sac without difficulty.  No fluid noted.  The long weighted speculum was then inserted.  The patient was previously placed in Trendelenburg as well.  I then directed my attention to the anterior bladder reflection and that was incised with Mayo scissors and adequate plane was developed.   Peritoneum was incised easily without difficulty and the anterior cul-de-sac was opened.  Bladder was not entered.  Prior to opening the anterior cul-de-sac, the uterosacrals were grasped on each side with a Heaney and suture ligated with a 0 Vicryl stitch and held, and then after that first tie was taken, the anterior cul-de-sac was opened.  The parametrial tissue was then skeletonized with Heaneys and 0 Vicryl to pop-off until the uterine fundus was reached and the utero-ovarian ligament was transected.  I attempted to flip the uterus was a single-tooth tenaculum and replaced in the Lahey clamps onto the cervix, but there was some limited mobility, so the [facility] were then used to grasp the cornu and that was transected.  Each apex was suture ligated with a zero freehand tie first and then Heaney stitch for hemostasis.  On the patient's left-hand side, it appeared a little oozing.  The peritoneum was reapproximated and observed.  Because of just a little wet, I did apply some Avitene to the area.  Both ovaries and tubes were evaluated and they appeared to be normal.  All instruments were removed from the vagina.  The long Allis clamps were used to grasp the  vagina.  Anchor stitches were placed on both angles and the uterosacral stitch was then tied to the close running vaginal stitch on a zero switched on needle.  A second layer were 2-0 Vicryl was used for imbrication.  Of note, on the posterior vagina, there was some bleeding noted and so that was made hemostatic by placing a running zero Vicryl deep or lower down on the vagina instead of right below the vaginal cuff.  The vagina was copiously irrigated and all instruments were removed.  Of note, there was a laparotomy sponge that was placed because the bowel was noted at the uterine fundus that was removed prior to closure.  The patient tolerated the procedure well.  All instrument, sponge, and needle counts were correct x3.  She  was awakened and taken to the recovery room in stable condition.     Pieter Partridge, MD     EBV/MEDQ  D:  01/31/2011  T:  01/31/2011  Job:  [id number]

## 2011-01-31 NOTE — Anesthesia Postprocedure Evaluation (Signed)
  Anesthesia Post-op Note  Patient: Tammie [name]  Procedure(s) Performed:  HYSTERECTOMY VAGINAL Patient is awake and responsive. Pain and nausea are reasonably well controlled. Vital signs are stable and clinically acceptable. Oxygen saturation is clinically acceptable. There are no apparent anesthetic complications at this time. Patient is ready for discharge.
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
## 2011-01-31 NOTE — Brief Op Note (Signed)
01/31/2011  12:14 PM  PATIENT:  Tammie [name]  40 y.o. female  PRE-OPERATIVE DIAGNOSIS:  Menometrorrhagia; Anemia  POST-OPERATIVE DIAGNOSIS:  Menometrorrhagia; Anemia  PROCEDURE:  Procedure(s): HYSTERECTOMY VAGINAL  SURGEON:  Surgeon(s): Geryl Rankins, MD Dorien Chihuahua. Cole  PHYSICIAN ASSISTANT: Dr. Richardson Dopp  ASSISTANTS: Technician   ANESTHESIA:   general  (LMA)  OR FLUID I/O:  Total I/O In: 1000 [I.V.:1000] Out: 700 [Urine:600; Blood:100]  BLOOD ADMINISTERED:none  DRAINS: Urinary Catheter (Foley)   LOCAL MEDICATIONS USED:  OTHER Putressin  SPECIMEN:  Source of Specimen:  Uterus w/ cervix  DISPOSITION OF SPECIMEN:  PATHOLOGY  COUNTS:  YES  TOURNIQUET:  * No tourniquets in log *  DICTATION: .Other Dictation: Dictation Number [id number]  PLAN OF CARE: Admit for overnight observation  PATIENT DISPOSITION:  PACU - hemodynamically stable.   Delay start of Pharmacological VTE agent (>24hrs) due to surgical blood loss or risk of bleeding:  not applicable

## 2011-02-01 ENCOUNTER — Encounter (HOSPITAL_COMMUNITY): Payer: Self-pay | Admitting: Obstetrics and Gynecology

## 2011-02-01 LAB — CREATININE, SERUM
Creatinine, Ser: 0.78 mg/dL (ref 0.50–1.10)
GFR calc Af Amer: >90 mL/min (ref >90)
GFR calc non Af Amer: >90 mL/min (ref >90)

## 2011-02-01 LAB — CBC
MCV: 75.5 fL — ABNORMAL LOW (ref 78.0–100.0)
Platelets: 219 10*3/uL (ref 150–400)
RBC: 3.87 MIL/uL (ref 3.87–5.11)
RDW: 14.4 % (ref 11.5–15.5)
WBC: 14 10*3/uL — ABNORMAL HIGH (ref 4.0–10.5)

## 2011-02-01 LAB — BUN: BUN: 6 mg/dL (ref 6–23)

## 2011-02-01 MED ORDER — HYDROCODONE-ACETAMINOPHEN 5-325 MG PO TABS: 1.0000 | ORAL_TABLET | Freq: Four times a day (QID) | ORAL | Status: DC | PRN

## 2011-02-01 MED ORDER — IBUPROFEN 800 MG PO TABS: 800.0000 mg | ORAL_TABLET | Freq: Three times a day (TID) | ORAL | Status: DC | PRN

## 2011-02-01 MED ORDER — OXYCODONE-ACETAMINOPHEN 5-325 MG PO TABS
1.0000 | ORAL_TABLET | ORAL | Status: DC | PRN
Start: 2011-02-01 — End: 2011-02-01
  Administered 2011-02-01: 2 via ORAL
  Filled 2011-02-01: qty 2

## 2011-02-01 MED ORDER — IBUPROFEN 800 MG PO TABS: 800.0000 mg | ORAL_TABLET | Freq: Three times a day (TID) | ORAL | Status: AC | PRN

## 2011-02-01 MED ORDER — HYDROCODONE-ACETAMINOPHEN 5-500 MG PO TABS: 1.0000 | ORAL_TABLET | Freq: Four times a day (QID) | ORAL | Status: AC | PRN

## 2011-02-01 MED ORDER — DIPHENHYDRAMINE HCL 25 MG PO CAPS
25.0000 mg | ORAL_CAPSULE | Freq: Four times a day (QID) | ORAL | Status: DC | PRN
  Administered 2011-02-01: 25 mg via ORAL
  Filled 2011-02-01: qty 1

## 2011-02-01 NOTE — Progress Notes (Signed)
1 Day Post-Op Procedure(s): HYSTERECTOMY VAGINAL  Subjective: Patient reports tolerating PO.  Itching that started after Percocet given.  Pt states her pain is well controlled now.  Left lower back pain post op, better now.  Objective: I have reviewed patient's vital signs and intake and output.  General: alert and cooperative.  Scratching constantly CV: RRR Lungs: CTA bilaterally Abdomen: appropriately tender, L>R Ext: WNL Hg 9.2  Assessment: s/p Procedure(s): HYSTERECTOMY VAGINAL: progressing well  Plan: Discharge home Benedryl for itching now.  Discharge home with Vicoden instead of Percocet. BUN/Cr prior to discharge. F/u in 2 weeks.   LOS: 1 day    Mazikeen Hehn 02/01/2011, 12:50 PM

## 2011-02-01 NOTE — Discharge Summary (Signed)
Physician Discharge Summary  Patient ID: Tammie [name] MRN: [id number] DOB/AGE: [birth year] 40 y.o.  Admit date: 01/31/2011 Discharge date: 02/01/2011  Admission Diagnoses:  Menometrorrhagia, Anemia   Discharge Diagnoses: Same Active Problems:  * No active hospital problems. *    Discharged Condition: good  Hospital Course: Uncomplicated.  Diet advanced as tolerated.  Pain well controlled.  Itching with Percocet-Benadryl given. Left lower back pain, BUN/Cr checked prior to discharge.  Consults: none  Significant Diagnostic Studies: labs: Hg (pre-op) 10.4 to 9.2 (post-op)  Treatments: surgery: [facility] 01/31/11  Discharge Exam: Blood pressure 103/66, pulse 81, temperature 98.6 F (37 C), temperature source Oral, resp. rate 18, height 5\' 7"  (1.702 m), weight 72.576 kg (160 lb), last menstrual period 01/21/2011, SpO2 99.00%. Physical exam WNL  Disposition: Home or Self Care  Discharge Orders    Future Orders Please Complete By Expires   Diet - low sodium heart healthy      Increase activity slowly      Discharge instructions      Comments:   Routine   Driving Restrictions      Comments:   No driving while using narcotics   Lifting restrictions      Comments:   No lifting greater than 15 pounds.   Sexual Activity Restrictions      Comments:   Nothing in vagina x 6 weeks.   No wound care      Call MD for:  temperature >100.4      Scheduling Instructions:   If temperature greater than 101.0.   Call MD for:  persistant nausea and vomiting      Call MD for:  severe uncontrolled pain      Call MD for:  difficulty breathing, headache or visual disturbances      Call MD for:  persistant dizziness or light-headedness        Current Discharge Medication List    START taking these medications   Details  HYDROcodone-acetaminophen (VICODIN) 5-500 MG per tablet Take 1-2 tablets by mouth every 6 (six) hours as needed for pain (every 4-6 hours prn pain). Qty: 30 tablet, Refills: 0      ibuprofen (ADVIL,MOTRIN) 800 MG tablet Take 1 tablet (800 mg total) by mouth every 8 (eight) hours as needed for pain. Qty: 30 tablet, Refills: 1      CONTINUE these medications which have NOT CHANGED   Details  ferrous sulfate 325 (65 FE) MG tablet Take 325 mg by mouth 3 (three) times daily with meals.      hydrochlorothiazide (HYDRODIURIL) 12.5 MG tablet Take 12.5 mg by mouth daily.      OVER THE COUNTER MEDICATION Take 1 capsule by mouth daily. Pt takes a homeopathic medication called Stinging Nettle.        Follow-up Information    Follow up with Geryl Rankins, MD. Make an appointment in 2 weeks.   Contact information:   [facility] [address] [place] [address] [phone]          Signed: Geryl Rankins 02/01/2011, 1:15 PM

## 2011-04-02 ENCOUNTER — Other Ambulatory Visit: Payer: Self-pay | Admitting: Obstetrics and Gynecology

## 2011-04-02 DIAGNOSIS — Z1231 Encounter for screening mammogram for malignant neoplasm of breast: Secondary | ICD-10-CM

## 2011-04-27 ENCOUNTER — Ambulatory Visit: Payer: 59

## 2011-08-10 ENCOUNTER — Encounter (HOSPITAL_COMMUNITY): Payer: Self-pay | Admitting: Pharmacist

## 2011-08-13 ENCOUNTER — Encounter (HOSPITAL_COMMUNITY): Payer: Self-pay

## 2011-08-13 ENCOUNTER — Encounter (HOSPITAL_COMMUNITY)
Admission: RE | Admit: 2011-08-13 | Discharge: 2011-08-13 | Disposition: A | Discharge status: Home / Self Care | Payer: PRIVATE HEALTH INSURANCE | Source: Ambulatory Visit | Attending: Obstetrics and Gynecology | Admitting: Obstetrics and Gynecology

## 2011-08-13 HISTORY — DX: Sciatica, unspecified side: M54.30

## 2011-08-13 HISTORY — DX: Other chronic pain: G89.29

## 2011-08-13 HISTORY — DX: Cervicalgia: M54.2

## 2011-08-13 HISTORY — DX: Dorsalgia, unspecified: M54.9

## 2011-08-13 HISTORY — DX: Unspecified osteoarthritis, unspecified site: M19.90

## 2011-08-13 HISTORY — DX: Insomnia, unspecified: G47.00

## 2011-08-13 LAB — BASIC METABOLIC PANEL
CO2: 29 mEq/L (ref 19–32)
Chloride: 103 mEq/L (ref 96–112)
GFR calc non Af Amer: 87 mL/min — ABNORMAL LOW (ref >90)
Glucose, Bld: 96 mg/dL (ref 70–99)
Potassium: 3.7 mEq/L (ref 3.5–5.1)
Sodium: 139 mEq/L (ref 135–145)

## 2011-08-13 LAB — SURGICAL PCR SCREEN: MRSA, PCR: NEGATIVE

## 2011-08-13 LAB — CBC
HCT: 38.8 % (ref 36.0–46.0)
Hemoglobin: 12.2 g/dL (ref 12.0–15.0)
MCH: 24.2 pg — ABNORMAL LOW (ref 26.0–34.0)
RBC: 5.05 MIL/uL (ref 3.87–5.11)

## 2011-08-13 NOTE — Pre-Procedure Instructions (Signed)
Reviewed patient's history/medications with Dr Arby Barrette.  Ok to use 01/2011 EKG for this surgery per Dr Arby Barrette.

## 2011-08-13 NOTE — Patient Instructions (Signed)
   Your procedure is scheduled on: Monday, April 22nd  Enter through the Main Entrance of [facility] at:12:30pm Pick up the phone at the desk and dial [phone] and inform us of your arrival.  Please call this number if you have any problems the morning of surgery: [phone]  Remember: Do not eat food after midnight: Sunday Do not drink clear liquids after: 10:00am Monday Take these medicines the morning of surgery with a SIP OF WATER: atenolol-chlorthalidone   Do not wear jewelry, make-up, or FINGER nail polish Do not wear lotions, powders, perfumes or deodorant. Do not shave 48 hours prior to surgery. Do not bring valuables to the hospital. Contacts, dentures or bridgework may not be worn into surgery.  Patients discharged on the day of surgery will not be allowed to drive home.  Patient to arrange a ride home prior to day of surgery.  Patient informed and understands that she can not drive home after surgery due to anesthesia.   Remember to use your hibiclens as instructed.Please shower with 1/2 bottle the evening before your surgery and the other 1/2 bottle the morning of surgery. Neck down avoiding private area.

## 2011-08-13 NOTE — Pre-Procedure Instructions (Signed)
[name] in Lab informed of patient's history of blood transfusion in 2012 at [facility].

## 2011-08-14 ENCOUNTER — Other Ambulatory Visit: Payer: Self-pay | Admitting: Obstetrics and Gynecology

## 2011-08-15 ENCOUNTER — Other Ambulatory Visit (HOSPITAL_COMMUNITY): Payer: 59

## 2011-08-16 NOTE — Pre-Procedure Instructions (Signed)
Informed patient of surgery time change. Instructed to arrive at 11:30.  Patton Salles RN

## 2011-08-19 ENCOUNTER — Other Ambulatory Visit: Payer: Self-pay | Admitting: Obstetrics and Gynecology
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
## 2011-08-20 ENCOUNTER — Encounter (HOSPITAL_COMMUNITY)
Admission: RE | Disposition: A | Discharge status: Home / Self Care | Payer: Self-pay | Source: Ambulatory Visit | Attending: Obstetrics and Gynecology

## 2011-08-20 ENCOUNTER — Encounter (HOSPITAL_COMMUNITY): Payer: Self-pay | Admitting: Anesthesiology

## 2011-08-20 ENCOUNTER — Encounter (HOSPITAL_COMMUNITY): Payer: Self-pay | Admitting: *Deleted

## 2011-08-20 ENCOUNTER — Observation Stay (HOSPITAL_COMMUNITY)
Admission: RE | Admit: 2011-08-20 | Discharge: 2011-08-21 | Disposition: A | Discharge status: Home / Self Care | Payer: PRIVATE HEALTH INSURANCE | Source: Ambulatory Visit | Attending: Obstetrics and Gynecology | Admitting: Obstetrics and Gynecology

## 2011-08-20 ENCOUNTER — Encounter (HOSPITAL_COMMUNITY): Payer: Self-pay | Admitting: General Practice

## 2011-08-20 ENCOUNTER — Ambulatory Visit (HOSPITAL_COMMUNITY): Payer: PRIVATE HEALTH INSURANCE | Admitting: Anesthesiology

## 2011-08-20 DIAGNOSIS — Z90721 Acquired absence of ovaries, unilateral: Secondary | ICD-10-CM

## 2011-08-20 DIAGNOSIS — N9489 Other specified conditions associated with female genital organs and menstrual cycle: Principal | ICD-10-CM | POA: Insufficient documentation

## 2011-08-20 DIAGNOSIS — N80109 Endometriosis of ovary, unspecified side, unspecified depth: Secondary | ICD-10-CM | POA: Insufficient documentation

## 2011-08-20 DIAGNOSIS — N736 Female pelvic peritoneal adhesions (postinfective): Secondary | ICD-10-CM | POA: Insufficient documentation

## 2011-08-20 DIAGNOSIS — N949 Unspecified condition associated with female genital organs and menstrual cycle: Secondary | ICD-10-CM | POA: Insufficient documentation

## 2011-08-20 DIAGNOSIS — R1032 Left lower quadrant pain: Secondary | ICD-10-CM | POA: Insufficient documentation

## 2011-08-20 DIAGNOSIS — N801 Endometriosis of ovary: Secondary | ICD-10-CM | POA: Insufficient documentation

## 2011-08-20 DIAGNOSIS — Z01818 Encounter for other preprocedural examination: Secondary | ICD-10-CM | POA: Insufficient documentation

## 2011-08-20 DIAGNOSIS — N2889 Other specified disorders of kidney and ureter: Secondary | ICD-10-CM | POA: Insufficient documentation

## 2011-08-20 DIAGNOSIS — Z01812 Encounter for preprocedural laboratory examination: Secondary | ICD-10-CM | POA: Insufficient documentation

## 2011-08-20 HISTORY — PX: SALPINGOOPHORECTOMY: SHX82

## 2011-08-20 HISTORY — PX: LAPAROSCOPY: SHX197

## 2011-08-20 LAB — TYPE AND SCREEN: Antibody Screen: NEGATIVE

## 2011-08-20 SURGERY — LAPAROSCOPY OPERATIVE
Anesthesia: General | Site: Abdomen | Wound class: Clean Contaminated

## 2011-08-20 MED ORDER — BUPIVACAINE HCL (PF) 0.25 % IJ SOLN
INTRAMUSCULAR | Status: AC
  Filled 2011-08-20: qty 30

## 2011-08-20 MED ORDER — KETOROLAC TROMETHAMINE 30 MG/ML IJ SOLN
INTRAMUSCULAR | Status: AC
  Filled 2011-08-20: qty 1

## 2011-08-20 MED ORDER — HYDROMORPHONE HCL PF 1 MG/ML IJ SOLN
0.2000 mg | INTRAMUSCULAR | Status: DC | PRN
  Administered 2011-08-20 – 2011-08-21 (×2): 0.6 mg via INTRAVENOUS
  Filled 2011-08-20 (×2): qty 1

## 2011-08-20 MED ORDER — FENTANYL CITRATE 0.05 MG/ML IJ SOLN
INTRAMUSCULAR | Status: DC | PRN
  Administered 2011-08-20: 25 ug via INTRAVENOUS
  Administered 2011-08-20: 50 ug via INTRAVENOUS
  Administered 2011-08-20: 25 ug via INTRAVENOUS
  Administered 2011-08-20: 100 ug via INTRAVENOUS
  Administered 2011-08-20 (×3): 50 ug via INTRAVENOUS

## 2011-08-20 MED ORDER — GLYCOPYRROLATE 0.2 MG/ML IJ SOLN
INTRAMUSCULAR | Status: DC | PRN
  Administered 2011-08-20: 0.4 mg via INTRAVENOUS
  Administered 2011-08-20: 0.2 mg via INTRAVENOUS
  Administered 2011-08-20: .2 mg via INTRAVENOUS

## 2011-08-20 MED ORDER — MEPERIDINE HCL 25 MG/ML IJ SOLN: 6.2500 mg | INTRAMUSCULAR | Status: DC | PRN

## 2011-08-20 MED ORDER — ONDANSETRON HCL 4 MG PO TABS: 4.0000 mg | ORAL_TABLET | Freq: Four times a day (QID) | ORAL | Status: DC | PRN

## 2011-08-20 MED ORDER — NEOSTIGMINE METHYLSULFATE 1 MG/ML IJ SOLN
INTRAMUSCULAR | Status: AC
  Filled 2011-08-20: qty 10

## 2011-08-20 MED ORDER — DEXAMETHASONE SODIUM PHOSPHATE 10 MG/ML IJ SOLN
INTRAMUSCULAR | Status: DC | PRN
  Administered 2011-08-20: 10 mg via INTRAVENOUS

## 2011-08-20 MED ORDER — BUPIVACAINE HCL (PF) 0.25 % IJ SOLN
INTRAMUSCULAR | Status: DC | PRN
  Administered 2011-08-20: 30 mL

## 2011-08-20 MED ORDER — METOCLOPRAMIDE HCL 5 MG/ML IJ SOLN: 10.0000 mg | Freq: Once | INTRAMUSCULAR | Status: DC | PRN

## 2011-08-20 MED ORDER — ONDANSETRON HCL 4 MG/2ML IJ SOLN
INTRAMUSCULAR | Status: AC
  Filled 2011-08-20: qty 2

## 2011-08-20 MED ORDER — HYDROMORPHONE HCL PF 1 MG/ML IJ SOLN
INTRAMUSCULAR | Status: AC
  Administered 2011-08-20: 1 mg via INTRAVENOUS
  Filled 2011-08-20: qty 1

## 2011-08-20 MED ORDER — GLYCOPYRROLATE 0.2 MG/ML IJ SOLN
INTRAMUSCULAR | Status: AC
  Filled 2011-08-20: qty 2

## 2011-08-20 MED ORDER — MIDAZOLAM HCL 2 MG/2ML IJ SOLN
INTRAMUSCULAR | Status: AC
  Filled 2011-08-20: qty 2

## 2011-08-20 MED ORDER — OXYCODONE-ACETAMINOPHEN 5-325 MG PO TABS: 1.0000 | ORAL_TABLET | ORAL | Status: DC | PRN

## 2011-08-20 MED ORDER — LIDOCAINE HCL (CARDIAC) 20 MG/ML IV SOLN
INTRAVENOUS | Status: DC | PRN
  Administered 2011-08-20: 60 mg via INTRAVENOUS

## 2011-08-20 MED ORDER — MIDAZOLAM HCL 5 MG/5ML IJ SOLN
INTRAMUSCULAR | Status: DC | PRN
  Administered 2011-08-20: 2 mg via INTRAVENOUS

## 2011-08-20 MED ORDER — SIMETHICONE 80 MG PO CHEW
80.0000 mg | CHEWABLE_TABLET | Freq: Four times a day (QID) | ORAL | Status: DC | PRN
Start: 2011-08-20 — End: 2011-08-21

## 2011-08-20 MED ORDER — LACTATED RINGERS IV SOLN: INTRAVENOUS | Status: DC

## 2011-08-20 MED ORDER — 0.9 % SODIUM CHLORIDE (POUR BTL) OPTIME
TOPICAL | Status: DC | PRN
  Administered 2011-08-20: 1000 mL

## 2011-08-20 MED ORDER — NEOSTIGMINE METHYLSULFATE 1 MG/ML IJ SOLN
INTRAMUSCULAR | Status: DC | PRN
  Administered 2011-08-20: 2 mg via INTRAVENOUS

## 2011-08-20 MED ORDER — CEFAZOLIN SODIUM 1-5 GM-% IV SOLN
INTRAVENOUS | Status: AC
  Administered 2011-08-20: 1 g via INTRAVENOUS
  Filled 2011-08-20: qty 50

## 2011-08-20 MED ORDER — ONDANSETRON HCL 4 MG/2ML IJ SOLN: 4.0000 mg | Freq: Four times a day (QID) | INTRAMUSCULAR | Status: DC | PRN

## 2011-08-20 MED ORDER — MENTHOL 3 MG MT LOZG: 1.0000 | LOZENGE | OROMUCOSAL | Status: DC | PRN

## 2011-08-20 MED ORDER — LACTATED RINGERS IV SOLN
INTRAVENOUS | Status: DC | PRN
  Administered 2011-08-20 (×4): via INTRAVENOUS

## 2011-08-20 MED ORDER — KETOROLAC TROMETHAMINE 30 MG/ML IJ SOLN
15.0000 mg | Freq: Once | INTRAMUSCULAR | Status: AC | PRN
  Administered 2011-08-20: 30 mg via INTRAVENOUS

## 2011-08-20 MED ORDER — ATENOLOL-CHLORTHALIDONE 50-25 MG PO TABS: 1.0000 | ORAL_TABLET | Freq: Every morning | ORAL | Status: DC

## 2011-08-20 MED ORDER — HYDROMORPHONE HCL PF 1 MG/ML IJ SOLN
INTRAMUSCULAR | Status: AC
  Filled 2011-08-20: qty 1

## 2011-08-20 MED ORDER — FENTANYL CITRATE 0.05 MG/ML IJ SOLN
INTRAMUSCULAR | Status: AC
  Filled 2011-08-20: qty 2

## 2011-08-20 MED ORDER — ATENOLOL 50 MG PO TABS
50.0000 mg | ORAL_TABLET | Freq: Every day | ORAL | Status: DC
  Administered 2011-08-21: 50 mg via ORAL
  Filled 2011-08-20 (×2): qty 1

## 2011-08-20 MED ORDER — ONDANSETRON HCL 4 MG/2ML IJ SOLN
INTRAMUSCULAR | Status: DC | PRN
  Administered 2011-08-20: 4 mg via INTRAVENOUS

## 2011-08-20 MED ORDER — PROPOFOL 10 MG/ML IV EMUL
INTRAVENOUS | Status: DC | PRN
  Administered 2011-08-20: 130 mg via INTRAVENOUS

## 2011-08-20 MED ORDER — IBUPROFEN 600 MG PO TABS
600.0000 mg | ORAL_TABLET | Freq: Four times a day (QID) | ORAL | Status: DC | PRN
  Administered 2011-08-21: 600 mg via ORAL
  Filled 2011-08-20: qty 1

## 2011-08-20 MED ORDER — HYDROMORPHONE HCL PF 1 MG/ML IJ SOLN
0.5000 mg | INTRAMUSCULAR | Status: DC | PRN
  Administered 2011-08-20: 1 mg via INTRAVENOUS
  Administered 2011-08-20: 0.5 mg via INTRAVENOUS

## 2011-08-20 MED ORDER — LACTATED RINGERS IV SOLN
INTRAVENOUS | Status: DC
  Administered 2011-08-20 – 2011-08-21 (×2): via INTRAVENOUS

## 2011-08-20 MED ORDER — EPHEDRINE SULFATE 50 MG/ML IJ SOLN
INTRAMUSCULAR | Status: DC | PRN
  Administered 2011-08-20 (×2): 10 mg via INTRAVENOUS

## 2011-08-20 MED ORDER — DEXAMETHASONE SODIUM PHOSPHATE 10 MG/ML IJ SOLN
INTRAMUSCULAR | Status: AC
  Filled 2011-08-20: qty 1

## 2011-08-20 MED ORDER — CHLORTHALIDONE 25 MG PO TABS
25.0000 mg | ORAL_TABLET | Freq: Every day | ORAL | Status: DC
  Administered 2011-08-21: 25 mg via ORAL
  Filled 2011-08-20 (×2): qty 1

## 2011-08-20 MED ORDER — FENTANYL CITRATE 0.05 MG/ML IJ SOLN
INTRAMUSCULAR | Status: AC
  Filled 2011-08-20: qty 5

## 2011-08-20 MED ORDER — LIDOCAINE HCL (CARDIAC) 20 MG/ML IV SOLN
INTRAVENOUS | Status: AC
  Filled 2011-08-20: qty 5

## 2011-08-20 MED ORDER — EPHEDRINE 5 MG/ML INJ
INTRAVENOUS | Status: AC
  Filled 2011-08-20: qty 10

## 2011-08-20 MED ORDER — DOCUSATE SODIUM 100 MG PO CAPS
100.0000 mg | ORAL_CAPSULE | Freq: Two times a day (BID) | ORAL | Status: DC
  Administered 2011-08-21: 100 mg via ORAL
  Filled 2011-08-20: qty 1

## 2011-08-20 MED ORDER — ROCURONIUM BROMIDE 50 MG/5ML IV SOLN
INTRAVENOUS | Status: AC
  Filled 2011-08-20: qty 1

## 2011-08-20 MED ORDER — ROCURONIUM BROMIDE 100 MG/10ML IV SOLN
INTRAVENOUS | Status: DC | PRN
  Administered 2011-08-20: 30 mg via INTRAVENOUS
  Administered 2011-08-20: 10 mg via INTRAVENOUS
  Administered 2011-08-20 (×2): 5 mg via INTRAVENOUS

## 2011-08-20 MED ORDER — CEFAZOLIN SODIUM 1-5 GM-% IV SOLN: 1.0000 g | INTRAVENOUS | Status: DC

## 2011-08-20 MED ORDER — LACTATED RINGERS IR SOLN
Status: DC | PRN
  Administered 2011-08-20: 3000 mL

## 2011-08-20 MED ORDER — PROPOFOL 10 MG/ML IV EMUL
INTRAVENOUS | Status: AC
  Filled 2011-08-20: qty 20

## 2011-08-20 SURGICAL SUPPLY — 34 items
BARRIER ADHS 3X4 INTERCEED (GAUZE/BANDAGES/DRESSINGS) ×6 IMPLANT
BENZOIN TINCTURE PRP APPL 2/3 (GAUZE/BANDAGES/DRESSINGS) IMPLANT
CHLORAPREP W/TINT 26ML (MISCELLANEOUS) ×3 IMPLANT
CLOTH BEACON ORANGE TIMEOUT ST (SAFETY) ×3 IMPLANT
DERMABOND ADVANCED (GAUZE/BANDAGES/DRESSINGS) ×1
DERMABOND ADVANCED .7 DNX12 (GAUZE/BANDAGES/DRESSINGS) ×2 IMPLANT
DISSECTOR BLUNT TIP ENDO 5MM (MISCELLANEOUS) ×6 IMPLANT
FORCEP GYRUS CUTTING 5MMX33CM (CUTTING FORCEPS) IMPLANT
FORCEPS CUTTING 33CM 5MM (CUTTING FORCEPS) ×3 IMPLANT
GLOVE BIO SURGEON STRL SZ7 (GLOVE) ×9 IMPLANT
GLOVE BIO SURGEON STRL SZ7.5 (GLOVE) ×3 IMPLANT
GLOVE BIOGEL M 6.5 STRL (GLOVE) ×3 IMPLANT
GLOVE BIOGEL PI IND STRL 7.0 (GLOVE) ×6 IMPLANT
GLOVE BIOGEL PI INDICATOR 7.0 (GLOVE) ×3
GLOVE ECLIPSE 6.5 STRL STRAW (GLOVE) ×6 IMPLANT
GLOVE INDICATOR 6.5 STRL GRN (GLOVE) ×3 IMPLANT
GOWN PREVENTION PLUS LG XLONG (DISPOSABLE) ×6 IMPLANT
PACK LAPAROSCOPY BASIN (CUSTOM PROCEDURE TRAY) ×3 IMPLANT
POUCH SPECIMEN RETRIEVAL 10MM (ENDOMECHANICALS) IMPLANT
PROTECTOR NERVE ULNAR (MISCELLANEOUS) ×6 IMPLANT
SCISSORS LAP 5X35 DISP (ENDOMECHANICALS) ×3 IMPLANT
SET IRRIG TUBING LAPAROSCOPIC (IRRIGATION / IRRIGATOR) ×3 IMPLANT
SLEEVE ADV FIXATION 5X100MM (TROCAR) ×3 IMPLANT
STRIP CLOSURE SKIN 1/4X4 (GAUZE/BANDAGES/DRESSINGS) IMPLANT
SUT MNCRL AB 4-0 PS2 18 (SUTURE) ×3 IMPLANT
SUT PLAIN 2 0 (SUTURE)
SUT PLAIN ABS 2-0 CT1 27XMFL (SUTURE) IMPLANT
SUT VICRYL 0 UR6 27IN ABS (SUTURE) ×3 IMPLANT
TOWEL OR 17X24 6PK STRL BLUE (TOWEL DISPOSABLE) ×6 IMPLANT
TRAY FOLEY CATH 14FR (SET/KITS/TRAYS/PACK) ×3 IMPLANT
TROCAR Z-THREAD FIOS 11X100 BL (TROCAR) ×3 IMPLANT
TROCAR Z-THREAD FIOS 5X100MM (TROCAR) ×3 IMPLANT
WARMER LAPAROSCOPE (MISCELLANEOUS) ×6 IMPLANT
WATER STERILE IRR 1000ML POUR (IV SOLUTION) ×3 IMPLANT

## 2011-08-20 NOTE — Addendum Note (Signed)
Addendum  created 08/20/11 2221 by Armanda Heritage, RN   Modules edited:Anesthesia Medication Administration

## 2011-08-20 NOTE — Brief Op Note (Signed)
08/20/2011  7:25 PM  PATIENT:  Tammie [name]  41 y.o. female  PRE-OPERATIVE DIAGNOSIS:  Pelvic Pain/Left, Pelvic mass  POST-OPERATIVE DIAGNOSIS:  Pelvic mass, suspicious for endometrioma  PROCEDURE:  Procedure(s) (LRB): LAPAROSCOPY OPERATIVE (N/A) LAPAROSCOPIC LYSIS OF ADHESIONS (Left) SALPINGO OOPHERECTOMY (Left)  SURGEON:  Surgeon(s) and Role:    * Geryl Rankins, MD - Primary    * Dorien Chihuahua. Richardson Dopp, MD - Assisting Jamie Brookes, Urology consultant       PHYSICIAN ASSISTANT: Dr. Richardson Dopp  ASSISTANTS: Technician   ANESTHESIA:   general  EBL:   250 cc  BLOOD ADMINISTERED:none  DRAINS: Urinary Catheter (Foley)   LOCAL MEDICATIONS USED:  MARCAINE     SPECIMEN:  Source of Specimen:  Left fallopian tube and ovary  DISPOSITION OF SPECIMEN:  PATHOLOGY  COUNTS:  YES  TOURNIQUET:  * No tourniquets in log *  DICTATION: .Other Dictation: Dictation Number [id number]  PLAN OF CARE:  Observation overnight  PATIENT DISPOSITION:  PACU - hemodynamically stable.   Delay start of Pharmacological VTE agent (>24hrs) due to surgical blood loss or risk of bleeding: not applicable

## 2011-08-20 NOTE — Anesthesia Preprocedure Evaluation (Signed)
Anesthesia Evaluation  Patient identified by MRN, date of birth, ID band Patient awake    Reviewed: Allergy & Precautions, H&P , NPO status , Patient's Chart, lab work & pertinent test results, reviewed documented beta blocker date and time   Airway Mallampati: II TM Distance: >3 FB Neck ROM: Full    Dental No notable dental hx. (+) Teeth Intact   Pulmonary neg pulmonary ROS,  breath sounds clear to auscultation  Pulmonary exam normal       Cardiovascular hypertension, Pt. on medications and Pt. on home beta blockers Rhythm:Regular Rate:Normal     Neuro/Psych  Headaches, negative psych ROS   GI/Hepatic negative GI ROS, Neg liver ROS,   Endo/Other  negative endocrine ROS  Renal/GU negative Renal ROS  negative genitourinary   Musculoskeletal  (+) Arthritis -, Osteoarthritis,    Abdominal   Peds  Hematology  (+) Blood dyscrasia, anemia ,   Anesthesia Other Findings   Reproductive/Obstetrics negative OB ROS                           Anesthesia Physical Anesthesia Plan  ASA: II  Anesthesia Plan: General   Post-op Pain Management:    Induction: Intravenous  Airway Management Planned: Oral ETT  Additional Equipment:   Intra-op Plan:   Post-operative Plan: Extubation in OR  Informed Consent: I have reviewed the patients History and Physical, chart, labs and discussed the procedure including the risks, benefits and alternatives for the proposed anesthesia with the patient or authorized representative who has indicated his/her understanding and acceptance.   Dental advisory given  Plan Discussed with: CRNA, Anesthesiologist and Surgeon  Anesthesia Plan Comments:         Anesthesia Quick Evaluation

## 2011-08-20 NOTE — Transfer of Care (Signed)
Immediate Anesthesia Transfer of Care Note  Patient: Tammie [name]  Procedure(s) Performed: Procedure(s) (LRB): LAPAROSCOPY OPERATIVE (N/A) LAPAROSCOPIC LYSIS OF ADHESIONS (Left) SALPINGO OOPHERECTOMY (Left)  Patient Location: PACU  Anesthesia Type: General  Level of Consciousness: awake and sedated  Airway & Oxygen Therapy: Patient Spontanous Breathing and Patient connected to nasal cannula oxygen  Post-op Assessment: Report given to PACU RN and Post -op Vital signs reviewed and stable  Post vital signs: Reviewed and stable  Complications: No apparent anesthesia complications

## 2011-08-20 NOTE — Anesthesia Postprocedure Evaluation (Signed)
Anesthesia Post Note  Patient: Tammie [name]  Procedure(s) Performed: Procedure(s) (LRB): LAPAROSCOPY OPERATIVE (N/A) LAPAROSCOPIC LYSIS OF ADHESIONS (Left) SALPINGO OOPHERECTOMY (Left)  Anesthesia type: General  Patient location: PACU  Post pain: Pain level controlled  Post assessment: Post-op Vital signs reviewed  Last Vitals:  Filed Vitals:   08/20/11 2015  BP: 117/62  Pulse: 89  Temp:   Resp: 14    Post vital signs: Reviewed  Level of consciousness: sedated  Complications: Patient had a prolonged surgery with arms tucked and legs in stirrups.  Is now complaining of right elbow pain and weakness.  Grip strength is diminished in right hand.  Sensation is intact.  She also complains that her right knee is somewhat sore.  Explained that these symptoms are likely related to positioning during her lengthy surgery, and will likely resolve as she is up and around more.  Otherwise pain is managed and patient is ready to go to the floor.  Jasmine December, MD

## 2011-08-20 NOTE — Interval H&P Note (Signed)
History and Physical Interval Note:  08/20/2011 1:35 PM  Tammie [name]  has presented today for surgery, with the diagnosis of Pelvic Pain/Left  The various methods of treatment have been discussed with the patient and family. After consideration of risks, benefits and other options for treatment, the patient has consented to  Procedure(s) (LRB): LAPAROSCOPY OPERATIVE (N/A) as a surgical intervention .  The patients' history has been reviewed, patient examined, no change in status, stable for surgery.  I have reviewed the patients' chart and labs.  Questions were answered to the patient's satisfaction.     Dion Body, Samyuktha Brau

## 2011-08-20 NOTE — Preoperative (Signed)
Beta Blockers Atenolol taken 08/19/11 @ 1900  Reason not to administer Beta Blockers:Not Applicable

## 2011-08-20 NOTE — Brief Op Note (Signed)
08/20/2011  6:03 PM  PATIENT:  Tammie [name]  41 y.o. female  PRE-OPERATIVE DIAGNOSIS:  Pelvic Pain/Left  POST-OPERATIVE DIAGNOSIS:  Left pelvic mass  PROCEDURE:  Procedure(s) (LRB): LAPAROSCOPIC left ureterolysis  SURGEON:  Surgeon(s) and Role:    * Geryl Rankins, MD - Primary    * Dorien Chihuahua. Richardson Dopp, MD - Assisting  PHYSICIAN ASSISTANT:  Natalia Leatherwood, MD  ASSISTANTS: none   ANESTHESIA:   general  EBL:  Total I/O In: 2000 [I.V.:2000] Out: 650 [Urine:450; Blood:200]  BLOOD ADMINISTERED:none  DRAINS: none   LOCAL MEDICATIONS USED:  NONE  SPECIMEN:  No Specimen  DISPOSITION OF SPECIMEN:  N/A  COUNTS:  YES  TOURNIQUET:  * No tourniquets in log *  DICTATION: .Other Dictation: Dictation Number [id number]  PLAN OF CARE: patient remained in the OR.  PATIENT DISPOSITION:  Patient remained in the OR   Delay start of Pharmacological VTE agent (>24hrs) due to surgical blood loss or risk of bleeding: Defered to treating MD.
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
## 2011-08-21 ENCOUNTER — Encounter (HOSPITAL_COMMUNITY): Payer: Self-pay | Admitting: Obstetrics and Gynecology

## 2011-08-21 LAB — CBC
MCH: 24.2 pg — ABNORMAL LOW (ref 26.0–34.0)
MCHC: 31.4 g/dL (ref 30.0–36.0)
Platelets: 242 10*3/uL (ref 150–400)

## 2011-08-21 LAB — BASIC METABOLIC PANEL
Calcium: 8.9 mg/dL (ref 8.4–10.5)
GFR calc Af Amer: >90 mL/min (ref >90)
GFR calc non Af Amer: >90 mL/min (ref >90)
Sodium: 135 mEq/L (ref 135–145)

## 2011-08-21 MED ORDER — KETOROLAC TROMETHAMINE 30 MG/ML IJ SOLN: 15.0000 mg | Freq: Four times a day (QID) | INTRAMUSCULAR | Status: DC | PRN

## 2011-08-21 MED ORDER — HYDROCODONE-ACETAMINOPHEN 5-325 MG PO TABS
1.0000 | ORAL_TABLET | ORAL | Status: DC | PRN
Start: 2011-08-21 — End: 2011-08-21
  Administered 2011-08-21: 1 via ORAL
  Filled 2011-08-21: qty 1

## 2011-08-21 MED ORDER — IBUPROFEN 600 MG PO TABS: 600.0000 mg | ORAL_TABLET | Freq: Four times a day (QID) | ORAL | Status: AC | PRN

## 2011-08-21 MED ORDER — HYDROCODONE-ACETAMINOPHEN 5-325 MG PO TABS: 1.0000 | ORAL_TABLET | ORAL | Status: AC | PRN

## 2011-08-21 NOTE — Addendum Note (Signed)
Addendum  created 08/21/11 0738 by Renford Dills, CRNA   Modules edited:Notes Section

## 2011-08-21 NOTE — Progress Notes (Signed)
Pt was discharged in the care of friend Mayo Ao per ambulatory. Stable. Denies pain or discomfort. Lapsites are clean and dry  No excessive vaginal bleeding.

## 2011-08-21 NOTE — Discharge Summary (Signed)
Physician Discharge Summary  Patient ID: Bettey Muraoka MRN: [id number] DOB/AGE: June 23, 1970 40 y.o.  Admit date: 08/20/2011 Discharge date: 08/21/2011  Admission Diagnoses: Left lower quadrant pain, pelvic mass  Discharge Diagnoses: S/p LSO, Ureterolysis, LOA, Endometriosis Active Problems:  * No active hospital problems. *    Discharged Condition: stable  Hospital Course: Pain controlled with Dilaudid and Toradol postop  Consults: Urology intraop for Ureterolysis  Significant Diagnostic Studies: None  Treatments: surgery: LSO, Ureterolysis, LOA  Discharge Exam: Blood pressure 125/77, pulse 81, temperature 97.7 F (36.5 C), temperature source Oral, resp. rate 18, height 5\' 6"  (1.676 m), weight 74.844 kg (165 lb), last menstrual period 02/02/2011, SpO2 95.00%. WNL, moderate abdominal tenderness with ambulation  Disposition: 01-Home or Self Care  Discharge Orders    Future Orders Please Complete By Expires   Diet - low sodium heart healthy      Increase activity slowly      Discharge instructions      Comments:   See discharge instructions.   Sexual Activity Restrictions      Comments:   Pelvic rest x 2 weeks.   No wound care      Call MD for:  temperature >100.4      Call MD for:  severe uncontrolled pain      Call MD for:  persistant nausea and vomiting      Call MD for:  persistant dizziness or light-headedness      Call MD for:  extreme fatigue      Call MD for:  redness, tenderness, or signs of infection (pain, swelling, redness, odor or green/yellow discharge around incision site)        Medication List  As of 08/21/2011  6:48 AM   TAKE these medications         ALIGN 4 MG Caps   Take 1 capsule by mouth every morning.      atenolol-chlorthalidone 50-25 MG per tablet   Commonly known as: TENORETIC   Take 1 tablet by mouth every morning.      ferrous sulfate 325 (65 FE) MG tablet   Take 325 mg by mouth 3 (three) times daily with meals.     HYDROcodone-acetaminophen 5-325 MG per tablet   Commonly known as: NORCO   Take 1-2 tablets by mouth every 4 (four) hours as needed.      ibuprofen 600 MG tablet   Commonly known as: ADVIL,MOTRIN   Take 1 tablet (600 mg total) by mouth every 6 (six) hours as needed (mild pain).      OVER THE COUNTER MEDICATION   Take 1 capsule by mouth every morning. Stinging Nettle.      zolpidem 10 MG tablet   Commonly known as: AMBIEN   Take 10 mg by mouth at bedtime as needed. For sleep           Follow-up Information    Follow up with Geryl Rankins, MD in 2 weeks. (Post op check)    Contact information:   [address] [place], [place]. [address] [place] [address] [phone]          Signed: Geryl Rankins 08/21/2011, 6:48 AM

## 2011-08-21 NOTE — Progress Notes (Signed)
1 Day Post-Op Procedure(s) (LRB): LAPAROSCOPY OPERATIVE (N/A) LAPAROSCOPIC LYSIS OF ADHESIONS (Left) SALPINGO OOPHERECTOMY (Left) Left ureterolysis  Subjective: Patient reports pain but has improved with medications.  Pt had right arm pain and difficulty moving it in PACU. Pt states she can move her arm and pain has decreased significantly.  Objective: I have reviewed patient's vital signs, intake and output and labs. Pt with significant discomfort returning to bed. General: alert, cooperative and no distress GI: Incisions clean and dry Extremities: no calf tenderness  Assessment: s/p Procedure(s) (LRB): LAPAROSCOPY OPERATIVE (N/A) LAPAROSCOPIC LYSIS OF ADHESIONS (Left) SALPINGO OOPHERECTOMY (Left): stable  Plan: Encourage ambulation Discontinue IV fluids Discharge home Advance to regular diet Discontinue Percocet due to h/o itching.  Start Vicodin.  Toradol prn.  LOS: 1 day    Tammie [name] 08/21/2011, 6:18 AM

## 2011-08-21 NOTE — Progress Notes (Signed)
UR Chart review completed.  

## 2011-08-21 NOTE — Op Note (Signed)
NAMEMarland Kitchen  YARETH, KEARSE NO.:  [id number]  MEDICAL RECORD NO.:  [id number]  LOCATION:  9320                          FACILITY:  [facility]  PHYSICIAN:  Natalia Leatherwood, MD    DATE OF BIRTH:  May 30, 1970  DATE OF PROCEDURE:  08/20/2011 DATE OF DISCHARGE:                              OPERATIVE REPORT   SURGEON:  Natalia Leatherwood, MD  ASSISTANT:  Dr. Geryl Rankins  PREOPERATIVE DIAGNOSIS:  Left pelvic pain.  POSTOPERATIVE DIAGNOSIS:  Left pelvic mass.  PROCEDURE PERFORMED:  Laparoscopic left ureterolysis.  ESTIMATED BLOOD LOSS:  Minimal.  COMPLICATIONS:  None.  FINDINGS:  Large left pelvic mass.  HISTORY OF PRESENT ILLNESS:  This is a 41 year old female, who has a history of total abdominal hysterectomy.  She had persistent abdominal pain and was taken to the operating room by Dr. Dion Body.  During dissection, she was found to have a large pelvic mass and there was concern as to the location of the ureter in association with this mass. I was consulted as the urologist on-call to further evaluate the location of the ureter in regard to this mass.  DESCRIPTION OF PROCEDURE:  When I was called, I entered the operating room.  The patient was already under general anesthetic with laparoscopic instrumentation placed.  I evaluated the situation and found that it was the left ureter that was in question.  As the ureter was not dissected or freely visible, we went up to the pelvic brim and incised the retroperitoneum.  This was dissected until the iliac vein and arteries were identified.  Once the iliac artery was identified, this was traced proximally until the ureter was identified.  The ureter was then dissected distally to where it came in proximity with the mass. The ureter seemed to be inferior and lateral to the mass, and the ureter was dissected easily without damage to the ureter.  It remained peristalsing the entire procedure.  The mass was able to be  dissected medial and anteriorly off the pelvic sidewall without attachment to the ureter.  Once this mass was removed, the ureter continued peristalsing.  As mentioned, the mass did not involve the ureter. The patient did have placement of a 5-mm trocar port, which was placed under direct visualization in the midline in the suprapubic area.  This was placed by Dr. Dion Body.  There was no injury or complication during the procedure.  After this was completed, I turned the case back over to Dr. Dion Body for completion of the surgery. The patient can follow up with me if she has any issues or problems, otherwise she can keep following up with Dr. Dion Body.          ______________________________ Natalia Leatherwood, MD     DW/MEDQ  D:  08/20/2011  T:  08/21/2011  Job:  [id number]

## 2011-08-21 NOTE — Discharge Instructions (Signed)
Unilateral Salpingo-Oophorectomy Care After Please read the instructions outlined below. Refer to this sheet for the next few weeks. These instructions provide you with general information on caring for yourself after you leave the hospital. Your caregiver may also give you specific instructions. While your treatment has been planned according to the most current medical practices available, unavoidable problems may occur. If you have any problems or questions after you leave, call your caregiver. HOME CARE INSTRUCTIONS   It is normal to be sore for a week or two. Call your caregiver if the pain is getting worse or the pain medication is not helping.   Have help when you go home for a week or so to help with the household chores.   Follow your caregiver's advice regarding diet.   Get rest and sleep.   Only take over-the-counter or prescription medicines for pain or discomfort as directed by your caregiver.   Do not take aspirin. It can cause bleeding.   Do not drive, exercise or lift anything over 5 pounds.   Do not drink alcohol until your caregiver gives you permission.   Do not lift anything over 5 pounds.   Do not have sexual intercourse until your caregiver says it is OK.   Take your temperature twice a day and write it down.   Change the bandage (dressing) as directed.   Make and keep your follow-up appointments for postoperative care.   If you become constipated, ask your caregiver about taking a mild laxative. Drinking more liquids than usual and eating bran foods can help prevent constipation.  SEEK MEDICAL CARE IF:   You have swelling or redness around the cut (incision).   You develop a rash.   You have side effects from the medication.   You feel lightheaded.   You need more or stronger medication.   You have pain, swelling or redness where the IV (intravenous) was placed.  SEEK IMMEDIATE MEDICAL CARE IF:  You develop an unexplained temperature above 100 F  (37.8 C).   You develop increasing belly (abdominal) pain.   You have pus coming out of the incision.   You notice a bad smell coming from the wound or dressing.   The incision is separating.   There is excessive vaginal bleeding.   You start to feel sick to your stomach (nauseous) and vomit.   You have leg or chest pain.   You have pain when you urinate.   You develop shortness of breath.   You pass out.  MAKE SURE YOU:   Understand these instructions.   Will watch your condition.   Will get help right away if you are not doing well or get worse.  Document Released: 02/10/2009 Document Revised: 04/05/2011 Document Reviewed: 02/10/2009 [facility] Patient Information 2012 [place], [place].

## 2011-08-21 NOTE — Op Note (Signed)
Tammie [name], Tammie [name]               ACCOUNT NO.:  [id number]  MEDICAL RECORD NO.:  [id number]  LOCATION:  9320                          FACILITY:  [facility]  PHYSICIAN:  EVELYN VARNADO         DATE OF BIRTH:  12-26-1970  DATE OF PROCEDURE:  08/20/2011 DATE OF DISCHARGE:                              OPERATIVE REPORT   PREOPERATIVE DIAGNOSIS:  Pelvic pain, left lower quadrant pain with pelvic mass.  POSTOPERATIVE DIAGNOSIS:  Pelvic pain, left lower quadrant pain with pelvic mass, mass suspicious for endometrioma.  PROCEDURES:  Operative laparoscopy with left salpingo-oophorectomy, and extensive lysis of adhesions.  SURGEON:  Pieter Partridge, MD.  ASSISTANTS: 1. Dr. Gerald Leitz 2. Dr. Natalia Leatherwood, Urology consultant. 3. Technician.  ANESTHESIA:  General.  ESTIMATED BLOOD LOSS:  About 250 mL.  BLOOD ADMINISTERED:  None.  DRAINS:  Foley catheter.  LOCAL ANESTHESIA:  Marcaine.  SPECIMENS:  Left fallopian tube and ovaries.  Specimens to pathology.  COUNTS:  Correct.  DISPOSITION:  PACU, hemodynamically stable.  INDICATION FOR PROCEDURE:  [name] is a 41 year old gravida 1, para 1, who underwent a vaginal hysterectomy in October of last year.  She had vaginal hysterectomy that was uncomplicated at the time of the surgery.  Her fallopian tubes and ovaries appeared normal.  Few months after the procedure, she started having some pelvic pain.  An ultrasound revealed a complex ovarian mass.  She received some birth control pills and a followup ultrasound showed that it has not resolved, but a more definitive hydrosalpinx versus pyosalpinx was noted.  Due to persisting discomfort and no change in size, the patient was consented to have removal of the left adnexal mass with possibility of oophorectomy which she accepted.  DESCRIPTION OF PROCEDURE:  She was taken to the operating room with IV running.  She was placed in the dorsal supine position and underwent general  anesthesia without complication.  She was then placed in the dorsal lithotomy position, and prepped and draped in the normal sterile fashion.  Foley catheter was placed.  Attention was turned to the abdomen after moistened sponge stick was placed in the vagina at the cuff.  The umbilicus was then injected with Marcaine and then infraumbilical incision was made.  The 5-mm trocar, platelets were was then advanced while the patient was in Trendelenburg. Once intraabdominal access was confirmed, the CO2 gas was used for insufflation and the intraabdominal access was confirmed with the laparoscope.  A second port was then placed on the patient's right which was also 5 mm under direct visualization after transillumination. Marcaine was injected in a similar fashion.  The same was done on the right hand side that was a 10 mm port.  Atraumatic graspers and blunt probe manipulators were used to mobilize the mass.  It was very clear upon entering the abdomen that the mass involved the fallopian tube which was somewhat ecchymotic, but the ovary was not visualized.  The majority of the mass was adhesed to the pelvic sidewall.  There were not many bowel adhesions noted.  The round ligament was still intact.  After some blunt dissection, the round ligament was cauterized with the scissors  and then cut to free up the adnexa.  Then, some minimal dissection with the scissors was used using just cutting.  I felt confident that we would be able to remove the mass by transecting the infundibulopelvic ligament, but I could not identify the ureter.  After trying to take down some scar tissue to get a bit of lift at the ureter and after trying to observe where the ureter was, it was not, we were unable to identify.  At that time, we called for a Urology consult.  Upon the arrival of the urologist, he agreed that the ureter was not readily or obviously identifiable.  We started opening the retroperitoneal  space.  He extended that.  Please see his dictation for the details, but essentially he was able to get through the scar tissue and identify the ureter.  The ureter did go very near the mass.  We were able to see the cuff and also see the Foley bulb in the bladder. Eventually, we were able to transect the left adnexa from the pelvic sidewall without damaging the ureter or any of the bowel.  The EndoCatch was then inserted through a 10-mm port and the mass was placed into the bag.  The bag was then brought through the incision and after cutting the bag and cutting up the specimen that was removed, copious irrigation was performed and the pelvic sidewall was accessed for any bleeding.  Initially, I wanted to put in some FloSeal, but after continued observation, there was no active bleeding.  The pneumoperitoneal was released.  There was no active bleeding.  Due to the extensive adhesive disease on that left sidewall, we did place Interceed on the left pelvic sidewall.  The trocars were removed under direct visualization.  All of the CO2 gas was removed.  The fascia was then reapproximated with 0 Vicryl.  The laparoscopic ports were then closed with 4-0 Monocryl and Dermabond. The sponge sticks were removed from the vagina.  The patient tolerated the procedure well.  All instrument, sponge, and needle counts were correct x3.  The specimen was sent to pathology.  She was awakened, sent to the recovery room in stable condition.  It was approximately 7 p.m. when we completed the procedure and the OR time was about 4 hours, so for that reason, the patient was to be kept overnight.          ______________________________ Geryl Rankins     EV/MEDQ  D:  08/20/2011  T:  08/21/2011  Job:  [id number]

## 2011-08-21 NOTE — Anesthesia Postprocedure Evaluation (Signed)
  Anesthesia Post-op Note  Patient: Tammie [name]  Procedure(s) Performed: Procedure(s) (LRB): LAPAROSCOPY OPERATIVE (N/A) LAPAROSCOPIC LYSIS OF ADHESIONS (Left) SALPINGO OOPHERECTOMY (Left)  Patient Location: [facility]  Anesthesia Type: General  Level of Consciousness: awake  Airway and Oxygen Therapy: Patient Spontanous Breathing  Post-op Pain: mild  Post-op Assessment: Patient's Cardiovascular Status Stable and Respiratory Function Stable  Post-op Vital Signs: stable  Complications: No apparent anesthesia complications

## 2011-12-12 ENCOUNTER — Other Ambulatory Visit: Payer: Self-pay | Admitting: Obstetrics and Gynecology

## 2011-12-12 DIAGNOSIS — N6310 Unspecified lump in the right breast, unspecified quadrant: Secondary | ICD-10-CM

## 2011-12-19 ENCOUNTER — Ambulatory Visit
Admission: RE | Admit: 2011-12-19 | Discharge: 2011-12-19 | Disposition: A | Discharge status: Home / Self Care | Payer: PRIVATE HEALTH INSURANCE | Source: Ambulatory Visit | Attending: Obstetrics and Gynecology | Admitting: Obstetrics and Gynecology

## 2011-12-19 ENCOUNTER — Other Ambulatory Visit: Payer: Self-pay | Admitting: Obstetrics and Gynecology

## 2011-12-19 DIAGNOSIS — N6310 Unspecified lump in the right breast, unspecified quadrant: Secondary | ICD-10-CM

## 2011-12-19 DIAGNOSIS — C50919 Malignant neoplasm of unspecified site of unspecified female breast: Secondary | ICD-10-CM | POA: Insufficient documentation

## 2011-12-19 HISTORY — DX: Malignant neoplasm of unspecified site of unspecified female breast: C50.919

## 2011-12-20 ENCOUNTER — Other Ambulatory Visit: Payer: Self-pay | Admitting: Obstetrics and Gynecology

## 2011-12-20 DIAGNOSIS — C50911 Malignant neoplasm of unspecified site of right female breast: Secondary | ICD-10-CM

## 2011-12-21 ENCOUNTER — Other Ambulatory Visit: Payer: Self-pay | Admitting: *Deleted

## 2011-12-21 ENCOUNTER — Telehealth: Payer: Self-pay | Admitting: *Deleted

## 2011-12-21 DIAGNOSIS — C50519 Malignant neoplasm of lower-outer quadrant of unspecified female breast: Secondary | ICD-10-CM | POA: Insufficient documentation

## 2011-12-21 NOTE — Telephone Encounter (Signed)
Confirmed [facility] for 12/26/11 at 0800 .  Instructions and contact information given.  

## 2011-12-25 ENCOUNTER — Ambulatory Visit
Admission: RE | Admit: 2011-12-25 | Discharge: 2011-12-25 | Disposition: A | Discharge status: Home / Self Care | Payer: PRIVATE HEALTH INSURANCE | Source: Ambulatory Visit | Attending: Obstetrics and Gynecology | Admitting: Obstetrics and Gynecology

## 2011-12-25 DIAGNOSIS — C50911 Malignant neoplasm of unspecified site of right female breast: Secondary | ICD-10-CM

## 2011-12-25 MED ORDER — GADOBENATE DIMEGLUMINE 529 MG/ML IV SOLN
15.0000 mL | Freq: Once | INTRAVENOUS | Status: AC | PRN
  Administered 2011-12-25: 15 mL via INTRAVENOUS

## 2011-12-26 ENCOUNTER — Ambulatory Visit
Admission: RE | Admit: 2011-12-26 | Discharge: 2011-12-26 | Disposition: A | Discharge status: Home / Self Care | Payer: PRIVATE HEALTH INSURANCE | Source: Ambulatory Visit | Attending: Radiation Oncology | Admitting: Radiation Oncology

## 2011-12-26 ENCOUNTER — Ambulatory Visit: Payer: PRIVATE HEALTH INSURANCE | Admitting: Genetic Counselor

## 2011-12-26 ENCOUNTER — Ambulatory Visit (HOSPITAL_BASED_OUTPATIENT_CLINIC_OR_DEPARTMENT_OTHER): Payer: PRIVATE HEALTH INSURANCE | Admitting: General Surgery

## 2011-12-26 ENCOUNTER — Ambulatory Visit: Payer: PRIVATE HEALTH INSURANCE | Admitting: Lab

## 2011-12-26 ENCOUNTER — Ambulatory Visit (HOSPITAL_BASED_OUTPATIENT_CLINIC_OR_DEPARTMENT_OTHER): Payer: PRIVATE HEALTH INSURANCE | Admitting: Oncology

## 2011-12-26 ENCOUNTER — Encounter: Payer: Self-pay | Admitting: *Deleted

## 2011-12-26 ENCOUNTER — Encounter: Payer: Self-pay | Admitting: Oncology

## 2011-12-26 ENCOUNTER — Ambulatory Visit: Payer: PRIVATE HEALTH INSURANCE | Attending: General Surgery | Admitting: Physical Therapy

## 2011-12-26 ENCOUNTER — Encounter (INDEPENDENT_AMBULATORY_CARE_PROVIDER_SITE_OTHER): Payer: Self-pay | Admitting: General Surgery

## 2011-12-26 ENCOUNTER — Ambulatory Visit: Payer: PRIVATE HEALTH INSURANCE

## 2011-12-26 ENCOUNTER — Other Ambulatory Visit (HOSPITAL_BASED_OUTPATIENT_CLINIC_OR_DEPARTMENT_OTHER): Payer: PRIVATE HEALTH INSURANCE | Admitting: Lab

## 2011-12-26 VITALS — BP 136/96 | HR 80 | Temp 97.9°F | Resp 20 | Ht 66.0 in | Wt 169.2 lb

## 2011-12-26 DIAGNOSIS — C50519 Malignant neoplasm of lower-outer quadrant of unspecified female breast: Secondary | ICD-10-CM

## 2011-12-26 DIAGNOSIS — IMO0001 Reserved for inherently not codable concepts without codable children: Secondary | ICD-10-CM | POA: Insufficient documentation

## 2011-12-26 DIAGNOSIS — C50919 Malignant neoplasm of unspecified site of unspecified female breast: Secondary | ICD-10-CM

## 2011-12-26 DIAGNOSIS — M25619 Stiffness of unspecified shoulder, not elsewhere classified: Secondary | ICD-10-CM | POA: Insufficient documentation

## 2011-12-26 DIAGNOSIS — Z170 Estrogen receptor positive status [ER+]: Secondary | ICD-10-CM

## 2011-12-26 LAB — CBC WITH DIFFERENTIAL/PLATELET
BASO%: 0.4 % (ref 0.0–2.0)
Basophils Absolute: 0 10*3/uL (ref 0.0–0.1)
EOS%: 0.3 % (ref 0.0–7.0)
HCT: 40.2 % (ref 34.8–46.6)
LYMPH%: 33.8 % (ref 14.0–49.7)
MCH: 25.7 pg (ref 25.1–34.0)
MCHC: 32.6 g/dL (ref 31.5–36.0)
MCV: 78.8 fL — ABNORMAL LOW (ref 79.5–101.0)
MONO%: 9.1 % (ref 0.0–14.0)
NEUT%: 56.4 % (ref 38.4–76.8)
Platelets: 268 10*3/uL (ref 145–400)

## 2011-12-26 LAB — CANCER ANTIGEN 27.29: CA 27.29: 25 U/mL (ref 0–39)

## 2011-12-26 LAB — COMPREHENSIVE METABOLIC PANEL (CC13)
AST: 18 U/L (ref 5–34)
BUN: 11 mg/dL (ref 7.0–26.0)
Creatinine: 0.8 mg/dL (ref 0.6–1.1)
Total Bilirubin: 0.3 mg/dL (ref 0.20–1.20)
Total Protein: 7 g/dL (ref 6.4–8.3)

## 2011-12-26 NOTE — Patient Instructions (Addendum)
Proceed with genetic testing and surgery first
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
## 2011-12-26 NOTE — Progress Notes (Addendum)
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 female  who underwent a hysterectomy in October. She developed a hematoma and significant anemia and had to be operated on in April. After April she was placed on estrogen replacement therapy. She palpated a right breast mass several weeks ago and brought to the attention of her OB/GYN. She was sent for mammogram which showed calcifications and mass measuring in total 8.0 x 3.9 cm with an associated 2-3 cm mass. Biopsy was performed of the mass which showed a grade 2 invasive ductal carcinoma with lymphovascular invasion which was ER/PR positive HER-2 positive with a Ki-67 of 70%. MRI was in performed which showed a 2.4 cm mass with a second mass with more inferiorly. The non-masslike enhancement measured 6.4 x 6.0 x 3.4 cm. 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 Is accompanied by her brother-in-law today. She does have a history of breast reductions many years ago. She states this mass has not been painful she has not noticed any skin retraction nipple changes or discharge. She has noted over the past several months the right breast does seem to be getting larger.Marland Kitchen  PREVIOUS RADIATION THERAPY: No  PAST MEDICAL HISTORY:  has a past medical history of Hypertension; Anemia; Insomnia; Heart murmur; Blood transfusion (2012); Headache; Sciatic nerve pain; Arthritis; Chronic back pain; Neck pain; and Breast cancer.    PAST SURGICAL HISTORY: Past Surgical History  Procedure Date  . Gastric bypass 2008  . Vaginal hysterectomy 01/31/2011    Procedure: HYSTERECTOMY VAGINAL;  Surgeon: Geryl Rankins, MD;  Location: [facility];  Service: Gynecology;   Laterality: N/A;  . Breast surgery     reduction  . Wisdom tooth extraction   . Eye surgery     Lasik - bilateral eye surgery  . Svd     x 1  . Laparoscopy 08/20/2011    Procedure: LAPAROSCOPY OPERATIVE;  Surgeon: Geryl Rankins, MD;  Location: [facility];  Service: Gynecology;  Laterality: N/A;  Dr. Georgina Pillion in at 1615; Assisted with Lysis of Adhesions  . Salpingoophorectomy 08/20/2011    Procedure: SALPINGO OOPHERECTOMY;  Surgeon: Geryl Rankins, MD;  Location: [facility];  Service: Gynecology;  Laterality: Left;    FAMILY HISTORY: She has 16 brothers and sisters with no family history of cancer.  Her mother did not have cancer.  Her daughter is 78 years old.   SOCIAL HISTORY:  reports that she has never smoked. She has never used smokeless tobacco. She reports that she drinks alcohol. She reports that she does not use illicit drugs.  ALLERGIES: Food  MEDICATIONS:  Current Outpatient Prescriptions  Medication Sig Dispense Refill  . atenolol-chlorthalidone (TENORETIC) 50-25 MG per tablet Take 1 tablet by mouth every morning.      . ferrous sulfate 325 (65 FE) MG tablet Take 325 mg by mouth 3 (three) times daily with meals.        . norethindrone-ethinyl estradiol 1/35 (ORTHO-NOVUM, NORTREL,CYCLAFEM) tablet Take 1 tablet by mouth daily.      Marland Kitchen OVER THE COUNTER MEDICATION Take 1 capsule by mouth every morning. Stinging Nettle.      . Probiotic Product (ALIGN) 4 MG CAPS Take 1 capsule by mouth every morning.      Marland Kitchen  zolpidem (AMBIEN) 10 MG tablet Take 10 mg by mouth at bedtime as needed. For sleep        REVIEW OF SYSTEMS:  A 15 point review of systems is documented in the electronic medical record. This was obtained by the nursing staff. However, I reviewed this with the patient to discuss relevant findings and make appropriate changes.  Pertinent items are noted in HPI.   PHYSICAL EXAM:  vitals were not taken for this visit.  . She is a pleasant female who appears her stated age sitting  comfortably examining table. No palpable super clavicular axillary or cervical adenopathy. Her right breast is asymmetric and slightly larger than her left breast. I middle to palpate the enlarged area from basically of breath behind the nipple through about 2 cm from the chest wall. This is not fixed. She does have changes bilaterally from her prior breast reductions.  LABORATORY DATA:  Lab Results  Component Value Date   WBC 3.8* 12/26/2011   HGB 13.1 12/26/2011   HCT 40.2 12/26/2011   MCV 78.8* 12/26/2011   PLT 268 12/26/2011   Lab Results  Component Value Date   NA 140 12/26/2011   K 3.3* 12/26/2011   CL 110* 12/26/2011   CO2 23 12/26/2011   Lab Results  Component Value Date   ALT 21 12/26/2011   AST 18 12/26/2011   ALKPHOS 64 12/26/2011   BILITOT 0.30 12/26/2011     RADIOGRAPHY: US Breast Right  12/19/2011  *RADIOLOGY REPORT*  Clinical Data:  The patient feels a mass in the right lower outer quadrant. She underwent bilateral reduction mammoplasty in 2003.  DIGITAL DIAGNOSTIC BILATERAL MAMMOGRAM WITH CAD AND RIGHT BREAST ULTRASOUND:  Comparison:  None.  Findings:  There are scattered fibroglandular densities.  There is a spiculated mass with suspicious microcalcifications in the right mid lower outer quadrant.  There are highly suspicious pleomorphic microcalcifications extending from the nipple to the chest wall in the right lower outer quadrant. Mammographic images were processed with CAD.  On physical exam, there is a 3 cm mass at 7:30 o'clock 6 cm from the right nipple.  Ultrasound is performed, showing an irregular mass with microcalcifications at 7:30 o'clock, 6 cm from the right nipple measuring 2.0 x 1.1 x 2.3 cm.  The appearance is highly suspicious for invasive mammary carcinoma and ductal carcinoma in situ.  I would suggest ultrasound-guided core needle biopsy of the solid mass today.  Additional biopsies of the calcifications may be necessary pending results of this biopsy.  IMPRESSION:  Highly suspicious mass and microcalcifications in the right lower outer quadrant.  RECOMMENDATION: Ultrasound-guided core needle biopsy of the solid mass is recommended today.  This will be performed and reported separately.  BI-RADS CATEGORY 5:  Highly suggestive of malignancy - appropriate action should be taken.   Original Report Authenticated By: Daryl Eastern, M.D.    Mr Breast Bilateral W Wo Contrast  12/25/2011  *RADIOLOGY REPORT*  Clinical Data: Recently diagnosed lower outer quadrant right breast invasive ductal carcinoma.  There are additional highly suspicious microcalcifications in the lower outer quadrant of the right breast extending from the nipple to the chest wall.  BUN and creatinine were obtained on site at [facility] at [address] Results:  BUN 10 mg/dL,  Creatinine 0.9 mg/dL.  BILATERAL BREAST MRI WITH AND WITHOUT CONTRAST  Technique: Multiplanar, multisequence MR images of both breasts were obtained prior to and following the intravenous administration of 15ml of MultiHance.  Three dimensional images were evaluated at the independent DynaCad workstation.  Comparison:  Recent mammogram, ultrasound and biopsy examinations.  Findings: Mild background parenchymal enhancement in both breasts.  2.4 x 2.4 x 1.3 cm oval mass with lobulated margins deep in the lower outer quadrant of the right breast.  This contains a biopsy marker clip artifact and has a large amount of enhancement with rapid washin/washout.  This corresponds to the recently biopsied invasive ductal carcinoma.  1.2 cm more anteriorly and superiorly in the lower outer quadrant of the right breast, a second, similar appearing mass is demonstrated measuring 2.2 x 2.0 x 1.5 cm in maximum dimensions. This has a mixture of enhancement kinetics, including a small amount of rapid washin/washout.  There are multiple additional nodular and confluent areas of enhancement throughout the lower outer quadrant of the right  breast.  This extends from the anterior portion of the breast to the pectoralis major muscle without evidence of involvement of the muscle.  These areas correspond to the distribution of the recently demonstrated highly suspicious microcalcifications.  The entire area measures 6.4 x 6.0 x 3.4 cm in maximum dimensions.  No additional masses or areas of enhancement suspicious for malignancy in either breast.  No abnormal appearing lymph nodes.  IMPRESSION:  1.  2.4 x 2.4 x 1.3 cm biopsy-proven invasive ductal carcinoma deep in the lower outer quadrant of the right breast. 2.  Additional 2.2 x 2.0 x 1.5 cm mass highly suspicious for malignancy more anteriorly in the lower outer quadrant of the right breast as well as a 6.4 x 6.0 x 3.4 cm area of multiple additional masses and areas of non mass enhancement occupying the majority of the lower outer quadrant of the right breast.  These are all highly suspicious for additional malignancy.  If documentation of extent of disease is clinically indicated, this could be performed with a stereotactic guided core needle biopsy of the microcalcifications seen mammographically. 3.  No evidence of malignancy on the left and no adenopathy.  RECOMMENDATION: Treatment plan  THREE-DIMENSIONAL MR IMAGE RENDERING ON INDEPENDENT WORKSTATION:  Three-dimensional MR images were rendered by post-processing of the original MR data on an independent workstation.  The three- dimensional MR images were interpreted, and findings were reported in the accompanying complete MRI report for this study.  BI-RADS CATEGORY 6:  Known biopsy-proven malignancy - appropriate action should be taken.   Original Report Authenticated By: Darrol Angel, M.D.    [place] Core Biopsy  12/20/2011  **ADDENDUM** CREATED: 12/20/2011 15:57:11  Final pathology demonstrates INVASIVE DUCTAL CARCINOMA WITH POSSIBLE LYMPHOVASCULAR INVASION. Histology correlates with imaging findings.  The patient was contacted by phone on  12/20/2011 and these results given to her which she understood. Her questions were answered. The patient had no complaints with her biopsy site.  The patient was [facility] educational material.  Recommend surgery - oncology consultation.  An appointment at the [facility]idisciplinary [facility] has been scheduled for 12/26/2011 and the patient informed. Recommend bilateral breast MRI, which has been scheduled for 12/25/2011 and the patient informed.  **END ADDENDUM** SIGNED BY: Tinnie Gens T. Si Gaul, M.D.   12/19/2011  *RADIOLOGY REPORT*  Clinical Data:  Suspicious mass with microcalcifications at 7:30 o'clock, 6 cm from the right nipple.  ULTRASOUND GUIDED VACUUM ASSISTED CORE BIOPSY OF THE RIGHT BREAST  The patient and I discussed the procedure of ultrasound-guided biopsy, including benefits and alternatives.  We discussed the high likelihood of a successful  procedure. We discussed the risks of the procedure including infection, bleeding, tissue injury, clip migration, and inadequate sampling.  Informed written consent was given.  Using sterile technique, 2% lidocaine, ultrasound guidance, and a 12 gauge vacuum assisted needle, biopsy was performed of the mass at 7:30 o'clock  6 cm from the right nipple using an oblique mediolateral approach.  At the conclusion of the procedure, a ribbon tissue marker clip was deployed into the biopsy cavity. Follow-up 2-view mammogram was performed and dictated separately.  IMPRESSION: Ultrasound-guided biopsy of a mass at 7:30 o'clock, 6 cm from the right nipple.  No apparent complications.   Original Report Authenticated By: Rosendo Gros, M.D.    Mm Digital Diagnostic Bilat  12/19/2011  *RADIOLOGY REPORT*  Clinical Data:  The patient feels a mass in the right lower outer quadrant. She underwent bilateral reduction mammoplasty in 2003.  DIGITAL DIAGNOSTIC BILATERAL MAMMOGRAM WITH CAD AND RIGHT BREAST ULTRASOUND:  Comparison:  None.  Findings:   There are scattered fibroglandular densities.  There is a spiculated mass with suspicious microcalcifications in the right mid lower outer quadrant.  There are highly suspicious pleomorphic microcalcifications extending from the nipple to the chest wall in the right lower outer quadrant. Mammographic images were processed with CAD.  On physical exam, there is a 3 cm mass at 7:30 o'clock 6 cm from the right nipple.  Ultrasound is performed, showing an irregular mass with microcalcifications at 7:30 o'clock, 6 cm from the right nipple measuring 2.0 x 1.1 x 2.3 cm.  The appearance is highly suspicious for invasive mammary carcinoma and ductal carcinoma in situ.  I would suggest ultrasound-guided core needle biopsy of the solid mass today.  Additional biopsies of the calcifications may be necessary pending results of this biopsy.  IMPRESSION: Highly suspicious mass and microcalcifications in the right lower outer quadrant.  RECOMMENDATION: Ultrasound-guided core needle biopsy of the solid mass is recommended today.  This will be performed and reported separately.  BI-RADS CATEGORY 5:  Highly suggestive of malignancy - appropriate action should be taken.   Original Report Authenticated By: Daryl Eastern, M.D.    Mm Digital Diagnostic Unilat R  12/19/2011  *RADIOLOGY REPORT*  Clinical Data:  Ultrasound-guided core needle biopsy of a mass at 7:30 o'clock, 6 cm from the right nipple.  DIGITAL DIAGNOSTIC RIGHT MAMMOGRAM  Comparison:  Previous exams.  Findings:  Films are performed following ultrasound guided biopsy of a mass at 7:30 o'clock, 6 cm from the right nipple.  The InRad ribbon clip is appropriately positioned within the mass.  IMPRESSION: Appropriate clip placement following ultrasound-guided core needle biopsy of a mass at 7:30 o'clock, 6 cm from the right nipple.   Original Report Authenticated By: Daryl Eastern, M.D.       IMPRESSION: T2 N0 invasive ductal carcinoma of the right breast  PLAN:  I spoke with Tammie [name] today regarding her diagnosis and options for treatment. She was seen and evaluated at our multidisciplinary clinic. Because of the amount of calcifications and the likelihood that this is DCIS we have recommended a mastectomy. She understands about recommendation and not because there is anything with a survival benefit in this situation. Because of this possible area measuring 8 cm I've asked her to wait for her delayed reconstruction. I think there is a small but real chance she could require radiation giving her a given her age and the size of the abnormality seen on MRI. For that reason I have asked her to  just to talk to the plastic surgeon but not make a surgery appointment at this time. I told her to stop taking her estrogen pills. She had a question about her daughter when she should undergo mammograms and we talked about discussed it should be at 41 years old. Dr. Abbey Chatters has referred her for genetics given her young age (but given that she has 16 brothers and sisters none with cancer I think the likelihood of her having a mutation is low) and will be performing a mastectomy and sentinel lymph node biopsy. She would then be on an antiestrogen under the care of Dr. Welton Flakes. I will be following up on her final pathology to make recommendations regarding radiation but hopefully she will have negative lymph nodes and all this other area will be DCIS. She met with a member of our patient family support staff as well as a physical therapist. She'll meet with our navigator at the end of clinic today. We discussed briefly the process of radiation the placement tattoos. We discussed 6 weeks of treatment as an outpatient. We discussed the possible side effects including but not limited to skin redness fatigue skin darkening and damage to the ribs and lungs.  I spent 60 minutes  face to face with the patient and more than 50% of that time was spent in counseling and/or coordination of care.    ------------------------------------------------  Lurline Hare, MD

## 2011-12-26 NOTE — Progress Notes (Signed)
[facility] Psychosocial Distress Screening Clinical Social Work  Patient completed distress screening protocol, and scored a 6 on the Psychosocial Distress Thermometer which indicates moderate distress. Clinical Social Worker met with patient in [facility] to assess for distress and other psychosocial needs.  Pt stated she felt less stress after meeting with the physicians, and knowing her treatment plan.  CSW informed pt of the support team and support services at [facility], and provided pt with a patient and family support calendar and additional resources information.  CSW encouraged pt to call with any additional questions or concerns.    Tamala Julian, MSW, LCSW Clinical Social Worker [facility] [phone]
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
## 2011-12-26 NOTE — Patient Instructions (Signed)
We will call you to schedule your surgery

## 2011-12-27 ENCOUNTER — Encounter: Payer: Self-pay | Admitting: *Deleted

## 2011-12-27 NOTE — Progress Notes (Signed)
Mailed after appt letter to pt. 

## 2011-12-28 ENCOUNTER — Other Ambulatory Visit: Payer: Self-pay | Admitting: *Deleted

## 2011-12-28 ENCOUNTER — Telehealth: Payer: Self-pay | Admitting: *Deleted

## 2011-12-28 DIAGNOSIS — C50519 Malignant neoplasm of lower-outer quadrant of unspecified female breast: Secondary | ICD-10-CM

## 2011-12-28 NOTE — Telephone Encounter (Signed)
Patient confirmed over the phone about the pet scan on 01-03-2012 arrival

## 2011-12-28 NOTE — Telephone Encounter (Signed)
Patient confirmed over the phone the new date and time for the pet scan on 01-03-2012 arrival time 6:45am

## 2011-12-28 NOTE — Telephone Encounter (Signed)
Spoke to pt concerning [facility] from 12/26/11.  Pt denies questions or concerns regarding dx or treatment care plan.  Encourage pt to call with needs.  Received verbal understanding.  Contact information given.

## 2012-01-03 ENCOUNTER — Encounter (HOSPITAL_COMMUNITY): Payer: Self-pay

## 2012-01-03 ENCOUNTER — Encounter (HOSPITAL_COMMUNITY)
Admission: RE | Admit: 2012-01-03 | Discharge: 2012-01-03 | Disposition: A | Discharge status: Home / Self Care | Payer: PRIVATE HEALTH INSURANCE | Source: Ambulatory Visit | Attending: Oncology | Admitting: Oncology

## 2012-01-03 ENCOUNTER — Other Ambulatory Visit (HOSPITAL_COMMUNITY): Payer: PRIVATE HEALTH INSURANCE

## 2012-01-03 DIAGNOSIS — C50519 Malignant neoplasm of lower-outer quadrant of unspecified female breast: Secondary | ICD-10-CM

## 2012-01-03 MED ORDER — FLUDEOXYGLUCOSE F - 18 (FDG) INJECTION
17.0000 | Freq: Once | INTRAVENOUS | Status: AC | PRN
  Administered 2012-01-03: 17 via INTRAVENOUS

## 2012-01-04 ENCOUNTER — Encounter: Payer: Self-pay | Admitting: Genetic Counselor

## 2012-01-04 ENCOUNTER — Telehealth: Payer: Self-pay | Admitting: *Deleted

## 2012-01-04 ENCOUNTER — Telehealth: Payer: Self-pay | Admitting: Genetic Counselor

## 2012-01-04 ENCOUNTER — Encounter: Payer: Self-pay | Admitting: *Deleted

## 2012-01-04 DIAGNOSIS — C50519 Malignant neoplasm of lower-outer quadrant of unspecified female breast: Secondary | ICD-10-CM

## 2012-01-04 NOTE — Telephone Encounter (Signed)
Revealed negative BRCA test results 

## 2012-01-04 NOTE — Telephone Encounter (Signed)
Pt called stating her genetic testing was negative and she was ready to schedule surgery and other testing.  Pt relate she has not scheduled a second opinion at [facility] at this time.  She also request to schedule appt with a plastic surgeon for delay reconstruction.  Appt made to see Dr. Kelly Splinter on 01/18/12 at 10:45.  Left pt vm giving date and time of appt.  Echo and chemo class will be scheduled prior to f/u with Dr. Welton Flakes on 01/28/12.

## 2012-01-07 ENCOUNTER — Encounter (INDEPENDENT_AMBULATORY_CARE_PROVIDER_SITE_OTHER): Payer: Self-pay | Admitting: General Surgery

## 2012-01-07 NOTE — Progress Notes (Signed)
Patient ID: Tammie [name], female   DOB: 05-Feb-1971, 41 y.o.   MRN: [id number] Her genetic testing was negative. I called her and we spoke about that today. I told her our recommendation was unchanged with respect to left mastectomy, left axillary sentinel lymph node biopsy, and Port-A-Cath insertion. She was entertaining getting a second opinion. I told her that if she felt that way I would advise her to go ahead and get a second opinion. If she would like to have her surgery done here, I told her to call our office and we will work on getting that scheduled.

## 2012-01-08 ENCOUNTER — Telehealth: Payer: Self-pay | Admitting: *Deleted

## 2012-01-08 NOTE — Telephone Encounter (Signed)
Confirmed future appts for echo, chemo class and f/u with Dr. Welton Flakes.  Pt denies further needs.  Contact information given.

## 2012-01-09 ENCOUNTER — Other Ambulatory Visit (INDEPENDENT_AMBULATORY_CARE_PROVIDER_SITE_OTHER): Payer: Self-pay | Admitting: General Surgery

## 2012-01-09 DIAGNOSIS — C50919 Malignant neoplasm of unspecified site of unspecified female breast: Secondary | ICD-10-CM

## 2012-01-11 ENCOUNTER — Telehealth: Payer: Self-pay | Admitting: Radiation Oncology

## 2012-01-11 NOTE — Telephone Encounter (Signed)
Patient phoned today requesting this writer inform those appropriate of various occurences. Patient reports that after seeking a second opinion she agrees along with the physicians that radiation should be started soon as after surgery. Patient reports that her surgery is scheduled for 01/17/2012. Routing this message to Dr. Michell Heinrich and Dr. Welton Flakes. Will phone patient back on Tuesday once Dr. Michell Heinrich returns to the office with any further directions.

## 2012-01-14 ENCOUNTER — Telehealth: Payer: Self-pay | Admitting: Oncology

## 2012-01-14 NOTE — Telephone Encounter (Signed)
The patient was talking to Angus Seller, [employer] and was asking her about her PET scan.   Melissa referred the patient to me.   The patient was seen at [facility]- by Dr. Erroll Luna who can do surgery this Thursday.   She has opted for that.   I reviewed the PET scan results with the patient, and the patient stated she had no more questions.   I called Dr. Wadie Lessen office to request a PORT be placed during surgery on Thursday per Dr> Khan's request.   Questions answered and my contact information was confirmed.   The patients verbalized understanding that she can call me or Marianne Sofia should she have any more questions.

## 2012-01-15 ENCOUNTER — Other Ambulatory Visit: Payer: PRIVATE HEALTH INSURANCE

## 2012-01-15 ENCOUNTER — Telehealth: Payer: Self-pay | Admitting: Oncology

## 2012-01-15 ENCOUNTER — Telehealth: Payer: Self-pay | Admitting: Radiation Oncology

## 2012-01-15 NOTE — Telephone Encounter (Signed)
Patient phoned last week to inform Dr. Michell Heinrich of surgery scheduled for 01/17/2012 and desire to begin radiation shortly after. Phoned patient back today after confirming with Dr. Michell Heinrich that chemotherapy would take place first to ensure patient was on the right track. Patient confirms surgery remains scheduled but, she is disappointed her port can't be place at the same time. Normalized patient's feelings and provided reassurance. Reminded patient of chemo education on 9/17 and follow up with Welton Flakes on 9/30. Patient verbalized understanding. Reassured patient that once chemo was drawing to an end Dr. Welton Flakes would contact Dr. Michell Heinrich and our staff would promptly get her in to be seen by Dr. Michell Heinrich to begin the radiation process. All questions answered. Patient verbalized understanding of all reviewed. Routed message to Dr. Michell Heinrich.

## 2012-01-15 NOTE — Telephone Encounter (Signed)
I left a message to discuss the plan to have a port placed here after her surgery unless she would like to have [facility] IR to do, since surgeons do not place ports at [facility]

## 2012-01-16 ENCOUNTER — Ambulatory Visit (HOSPITAL_COMMUNITY)
Admission: RE | Admit: 2012-01-16 | Discharge: 2012-01-16 | Disposition: A | Discharge status: Home / Self Care | Payer: PRIVATE HEALTH INSURANCE | Source: Ambulatory Visit | Attending: Oncology | Admitting: Oncology

## 2012-01-16 DIAGNOSIS — D4989 Neoplasm of unspecified behavior of other specified sites: Secondary | ICD-10-CM

## 2012-01-16 DIAGNOSIS — I10 Essential (primary) hypertension: Secondary | ICD-10-CM | POA: Insufficient documentation

## 2012-01-16 DIAGNOSIS — C50519 Malignant neoplasm of lower-outer quadrant of unspecified female breast: Secondary | ICD-10-CM | POA: Insufficient documentation

## 2012-01-16 DIAGNOSIS — Z01818 Encounter for other preprocedural examination: Secondary | ICD-10-CM | POA: Insufficient documentation

## 2012-01-16 NOTE — Progress Notes (Signed)
  Echocardiogram 2D Echocardiogram has been performed.  Tammie [name] 01/16/2012, 11:40 AM

## 2012-01-17 HISTORY — PX: MASTECTOMY: SHX3

## 2012-01-18 ENCOUNTER — Telehealth: Payer: Self-pay | Admitting: *Deleted

## 2012-01-18 NOTE — Telephone Encounter (Signed)
Pt called to r/s f/u appt with Dr. Welton Flakes on 9/30.  Pt relate she is having her drains removed in [place] on 9/30.  R/S appt to 10/3 for 3/3:30.  Gave pt new date and time.  Pt relate she is doing well after surgery and "a little sore".  Encourage pt to rest and to call with needs or concerns.  Received verbal understanding. Contact information given.

## 2012-01-28 ENCOUNTER — Other Ambulatory Visit: Payer: PRIVATE HEALTH INSURANCE | Admitting: Lab

## 2012-01-28 ENCOUNTER — Ambulatory Visit: Payer: PRIVATE HEALTH INSURANCE | Admitting: Oncology

## 2012-01-31 ENCOUNTER — Ambulatory Visit (HOSPITAL_BASED_OUTPATIENT_CLINIC_OR_DEPARTMENT_OTHER): Payer: PRIVATE HEALTH INSURANCE | Admitting: Oncology

## 2012-01-31 ENCOUNTER — Telehealth: Payer: Self-pay | Admitting: Oncology

## 2012-01-31 ENCOUNTER — Other Ambulatory Visit (HOSPITAL_BASED_OUTPATIENT_CLINIC_OR_DEPARTMENT_OTHER): Payer: PRIVATE HEALTH INSURANCE | Admitting: Lab

## 2012-01-31 ENCOUNTER — Encounter: Payer: Self-pay | Admitting: Dietician

## 2012-01-31 ENCOUNTER — Other Ambulatory Visit: Payer: Self-pay | Admitting: Emergency Medicine

## 2012-01-31 VITALS — BP 113/78 | HR 72 | Temp 98.8°F | Resp 20 | Ht 66.0 in | Wt 163.2 lb

## 2012-01-31 DIAGNOSIS — C50919 Malignant neoplasm of unspecified site of unspecified female breast: Secondary | ICD-10-CM

## 2012-01-31 DIAGNOSIS — C50519 Malignant neoplasm of lower-outer quadrant of unspecified female breast: Secondary | ICD-10-CM

## 2012-01-31 DIAGNOSIS — Z170 Estrogen receptor positive status [ER+]: Secondary | ICD-10-CM

## 2012-01-31 LAB — CBC WITH DIFFERENTIAL/PLATELET
BASO%: 0.5 % (ref 0.0–2.0)
Eosinophils Absolute: 0 10*3/uL (ref 0.0–0.5)
MCHC: 32.7 g/dL (ref 31.5–36.0)
MONO#: 0.4 10*3/uL (ref 0.1–0.9)
NEUT#: 1.5 10*3/uL (ref 1.5–6.5)
RBC: 4.92 10*6/uL (ref 3.70–5.45)
RDW: 14.8 % — ABNORMAL HIGH (ref 11.2–14.5)
WBC: 3.8 10*3/uL — ABNORMAL LOW (ref 3.9–10.3)
lymph#: 1.9 10*3/uL (ref 0.9–3.3)

## 2012-01-31 LAB — COMPREHENSIVE METABOLIC PANEL (CC13)
ALT: 58 U/L — ABNORMAL HIGH (ref 0–55)
Albumin: 3.7 g/dL (ref 3.5–5.0)
CO2: 23 mEq/L (ref 22–29)
Calcium: 9.2 mg/dL (ref 8.4–10.4)
Chloride: 107 mEq/L (ref 98–107)
Potassium: 4 mEq/L (ref 3.5–5.1)
Sodium: 140 mEq/L (ref 136–145)
Total Bilirubin: 0.3 mg/dL (ref 0.20–1.20)
Total Protein: 7.3 g/dL (ref 6.4–8.3)

## 2012-01-31 NOTE — Telephone Encounter (Signed)
gv pt appt schedule for October. D/t for 10/18 @ 4pm per KK. Sent message to Dr. Maris Berger nurse Milas Hock via [facility] re appts for port placement and JP removal. [facility] will contact pt - pt aware.

## 2012-01-31 NOTE — Patient Instructions (Addendum)
Port placement by Dr. Abbey Chatters  I will see you back in 02/15/12

## 2012-01-31 NOTE — Progress Notes (Signed)
Brief Out-patient Oncology Nutrition Note  Reason: Patient attended breast cancer nutrition course.   I have educated the patient on plant-based diet. I also discussed and provided nutrition handouts and research based evidence on breast cancer nutrition. We discussed nutrition management for symptoms associated with treatment. The class lasted a duration of 1 hour. The patient's asked good questions and were without any further nutrition related questions or concerns. RD contact information was provided. Patient's were instructed to contact RD for future nutrition questions or concerns.   RD available for nutrition needs.   Britany Callicott D Patricia Fargo, MS, RD, LDN [phone]  

## 2012-02-01 ENCOUNTER — Telehealth: Payer: Self-pay | Admitting: *Deleted

## 2012-02-01 ENCOUNTER — Other Ambulatory Visit (HOSPITAL_COMMUNITY): Payer: PRIVATE HEALTH INSURANCE

## 2012-02-01 ENCOUNTER — Telehealth: Payer: Self-pay | Admitting: Oncology

## 2012-02-01 ENCOUNTER — Telehealth (INDEPENDENT_AMBULATORY_CARE_PROVIDER_SITE_OTHER): Payer: Self-pay

## 2012-02-01 ENCOUNTER — Ambulatory Visit: Admit: 2012-02-01 | Payer: Self-pay | Admitting: General Surgery

## 2012-02-01 SURGERY — MASTECTOMY WITH SENTINEL LYMPH NODE BIOPSY
Anesthesia: General | Site: Breast | Laterality: Right
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
## 2012-02-01 NOTE — Telephone Encounter (Signed)
Received a call from [employer] @ [facility] ([place]/Nurse). Per Cyndra Numbers pt has her surgery else where [facility]) and will have to return to [facility] for both port placement and JP removal. Per Bernie Dr. Abbey Chatters is not going to see pt. S/w KK she is aware and per KK she will take care of this.

## 2012-02-01 NOTE — Telephone Encounter (Signed)
Pt was instructed to call her surgeon at [facility] to schedule removal of her JP drain.  I spoke with Ernestine Mcmurray, RN at [facility], and she will speak with Dr. Welton Flakes regarding the [facility] placement.  Patient is aware of this and will wait to hear from Dr. Welton Flakes.

## 2012-02-01 NOTE — Telephone Encounter (Signed)
Spoke with Olegario Messier in IR she stated they do see the order in the system will get to calling the patient on Monday 02-04-2012 about scheduling for the port placement

## 2012-02-04 ENCOUNTER — Encounter (INDEPENDENT_AMBULATORY_CARE_PROVIDER_SITE_OTHER): Payer: PRIVATE HEALTH INSURANCE

## 2012-02-05 ENCOUNTER — Ambulatory Visit: Payer: PRIVATE HEALTH INSURANCE | Attending: Oncology

## 2012-02-05 DIAGNOSIS — C50919 Malignant neoplasm of unspecified site of unspecified female breast: Secondary | ICD-10-CM | POA: Insufficient documentation

## 2012-02-05 DIAGNOSIS — M25619 Stiffness of unspecified shoulder, not elsewhere classified: Secondary | ICD-10-CM | POA: Insufficient documentation

## 2012-02-05 DIAGNOSIS — IMO0001 Reserved for inherently not codable concepts without codable children: Secondary | ICD-10-CM | POA: Insufficient documentation

## 2012-02-08 HISTORY — PX: AXILLARY NODE DISSECTION: SHX1211

## 2012-02-11 ENCOUNTER — Ambulatory Visit: Payer: PRIVATE HEALTH INSURANCE | Admitting: Adult Health

## 2012-02-11 ENCOUNTER — Encounter (HOSPITAL_COMMUNITY): Payer: Self-pay | Admitting: Pharmacy Technician

## 2012-02-12 ENCOUNTER — Other Ambulatory Visit: Payer: Self-pay | Admitting: Radiology

## 2012-02-14 ENCOUNTER — Ambulatory Visit (HOSPITAL_COMMUNITY)
Admission: RE | Admit: 2012-02-14 | Discharge: 2012-02-14 | Disposition: A | Discharge status: Home / Self Care | Payer: PRIVATE HEALTH INSURANCE | Source: Ambulatory Visit | Attending: Oncology | Admitting: Oncology

## 2012-02-14 ENCOUNTER — Other Ambulatory Visit: Payer: Self-pay | Admitting: Oncology

## 2012-02-14 DIAGNOSIS — I10 Essential (primary) hypertension: Secondary | ICD-10-CM | POA: Insufficient documentation

## 2012-02-14 DIAGNOSIS — C50919 Malignant neoplasm of unspecified site of unspecified female breast: Secondary | ICD-10-CM | POA: Insufficient documentation

## 2012-02-14 DIAGNOSIS — Z9884 Bariatric surgery status: Secondary | ICD-10-CM | POA: Insufficient documentation

## 2012-02-14 DIAGNOSIS — Z79899 Other long term (current) drug therapy: Secondary | ICD-10-CM | POA: Insufficient documentation

## 2012-02-14 LAB — CBC
HCT: 41.7 % (ref 36.0–46.0)
MCHC: 32.9 g/dL (ref 30.0–36.0)
MCV: 77.4 fL — ABNORMAL LOW (ref 78.0–100.0)
Platelets: 282 10*3/uL (ref 150–400)
RDW: 13.7 % (ref 11.5–15.5)

## 2012-02-14 MED ORDER — HEPARIN SOD (PORK) LOCK FLUSH 100 UNIT/ML IV SOLN
500.0000 [IU] | Freq: Once | INTRAVENOUS | Status: AC
  Administered 2012-02-14: 500 [IU] via INTRAVENOUS

## 2012-02-14 MED ORDER — LIDOCAINE HCL 1 % IJ SOLN
INTRAMUSCULAR | Status: AC
  Filled 2012-02-14: qty 20

## 2012-02-14 MED ORDER — FENTANYL CITRATE 0.05 MG/ML IJ SOLN
INTRAMUSCULAR | Status: AC
  Filled 2012-02-14: qty 6

## 2012-02-14 MED ORDER — FENTANYL CITRATE 0.05 MG/ML IJ SOLN
INTRAMUSCULAR | Status: AC | PRN
  Administered 2012-02-14: 100 ug via INTRAVENOUS

## 2012-02-14 MED ORDER — CEFAZOLIN SODIUM 1-5 GM-% IV SOLN
1.0000 g | INTRAVENOUS | Status: AC
  Administered 2012-02-14: 1 g via INTRAVENOUS

## 2012-02-14 MED ORDER — MIDAZOLAM HCL 2 MG/2ML IJ SOLN
INTRAMUSCULAR | Status: AC
  Filled 2012-02-14: qty 6

## 2012-02-14 MED ORDER — SODIUM BICARBONATE 4 % IV SOLN
INTRAVENOUS | Status: AC
  Filled 2012-02-14: qty 5

## 2012-02-14 MED ORDER — MIDAZOLAM HCL 2 MG/2ML IJ SOLN
INTRAMUSCULAR | Status: AC | PRN
  Administered 2012-02-14 (×2): 2 mg via INTRAVENOUS

## 2012-02-14 MED ORDER — SODIUM CHLORIDE 0.9 % IV SOLN: INTRAVENOUS | Status: DC

## 2012-02-14 MED ORDER — LIDOCAINE HCL (PF) 1 % IJ SOLN
INTRAMUSCULAR | Status: AC
  Filled 2012-02-14: qty 30

## 2012-02-14 MED ORDER — CEFAZOLIN SODIUM 1-5 GM-% IV SOLN
INTRAVENOUS | Status: AC
  Filled 2012-02-14: qty 50
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
## 2012-02-14 NOTE — Procedures (Signed)
Placement of left IJ portacath. Tip in lower SVC and ready to use.  No immediate complication.

## 2012-02-15 ENCOUNTER — Ambulatory Visit (HOSPITAL_BASED_OUTPATIENT_CLINIC_OR_DEPARTMENT_OTHER): Payer: PRIVATE HEALTH INSURANCE | Admitting: Oncology

## 2012-02-15 ENCOUNTER — Encounter: Payer: Self-pay | Admitting: Oncology

## 2012-02-15 VITALS — BP 121/85 | HR 74 | Temp 97.7°F | Resp 20 | Ht 66.0 in | Wt 162.6 lb

## 2012-02-15 DIAGNOSIS — C50519 Malignant neoplasm of lower-outer quadrant of unspecified female breast: Secondary | ICD-10-CM

## 2012-02-15 DIAGNOSIS — Z170 Estrogen receptor positive status [ER+]: Secondary | ICD-10-CM

## 2012-02-15 MED ORDER — DEXAMETHASONE 4 MG PO TABS: ORAL_TABLET | ORAL | Status: DC

## 2012-02-15 MED ORDER — PROCHLORPERAZINE MALEATE 10 MG PO TABS: 10.0000 mg | ORAL_TABLET | Freq: Four times a day (QID) | ORAL | Status: DC | PRN

## 2012-02-15 MED ORDER — LIDOCAINE-PRILOCAINE 2.5-2.5 % EX CREA: TOPICAL_CREAM | CUTANEOUS | Status: DC | PRN

## 2012-02-15 MED ORDER — ONDANSETRON HCL 8 MG PO TABS: 8.0000 mg | ORAL_TABLET | Freq: Two times a day (BID) | ORAL | Status: DC | PRN

## 2012-02-15 MED ORDER — LORAZEPAM 0.5 MG PO TABS: 0.5000 mg | ORAL_TABLET | Freq: Four times a day (QID) | ORAL | Status: DC | PRN

## 2012-02-15 MED ORDER — PROCHLORPERAZINE 25 MG RE SUPP: 25.0000 mg | Freq: Two times a day (BID) | RECTAL | Status: DC | PRN

## 2012-02-15 NOTE — Progress Notes (Signed)
Put disability form on nurse's desk. °

## 2012-02-15 NOTE — Patient Instructions (Addendum)
Proceed with chemotherapy week of 03/06/12  I will see you back on 03/06/12  Doxorubicin injection What is this medicine? DOXORUBICIN (dox oh ROO bi sin) is a chemotherapy drug. It is used to treat many kinds of cancer like Hodgkin's disease, leukemia, non-Hodgkin's lymphoma, neuroblastoma, sarcoma, and Wilms' tumor. It is also used to treat bladder cancer, breast cancer, lung cancer, ovarian cancer, stomach cancer, and thyroid cancer. This medicine may be used for other purposes; ask your health care provider or pharmacist if you have questions. What should I tell my health care provider before I take this medicine? They need to know if you have any of these conditions: -blood disorders -heart disease, recent heart attack -infection (especially a virus infection such as chickenpox, cold sores, or herpes) -irregular heartbeat -liver disease -recent or ongoing radiation therapy -an unusual or allergic reaction to doxorubicin, other chemotherapy agents, other medicines, foods, dyes, or preservatives -pregnant or trying to get pregnant -breast-feeding How should I use this medicine? This drug is given as an infusion into a vein. It is administered in a hospital or clinic by a specially trained health care professional. If you have pain, swelling, burning or any unusual feeling around the site of your injection, tell your health care professional right away. Talk to your pediatrician regarding the use of this medicine in children. Special care may be needed. Overdosage: If you think you have taken too much of this medicine contact a poison control center or emergency room at once. NOTE: This medicine is only for you. Do not share this medicine with others. What if I miss a dose? It is important not to miss your dose. Call your doctor or health care professional if you are unable to keep an appointment. What may interact with this medicine? Do not take this medicine with any of the following  medications: -cisapride -droperidol -halofantrine -pimozide -zidovudine This medicine may also interact with the following medications: -chloroquine -chlorpromazine -clarithromycin -cyclophosphamide -cyclosporine -erythromycin -medicines for depression, anxiety, or psychotic disturbances -medicines for irregular heart beat like amiodarone, bepridil, dofetilide, encainide, flecainide, propafenone, quinidine -medicines for seizures like ethotoin, fosphenytoin, phenytoin -medicines for nausea, vomiting like dolasetron, ondansetron, palonosetron -medicines to increase blood counts like filgrastim, pegfilgrastim, sargramostim -methadone -methotrexate -pentamidine -progesterone -vaccines -verapamil Talk to your doctor or health care professional before taking any of these medicines: -acetaminophen -aspirin -ibuprofen -ketoprofen -naproxen This list may not describe all possible interactions. Give your health care provider a list of all the medicines, herbs, non-prescription drugs, or dietary supplements you use. Also tell them if you smoke, drink alcohol, or use illegal drugs. Some items may interact with your medicine. What should I watch for while using this medicine? Your condition will be monitored carefully while you are receiving this medicine. You will need important blood work done while you are taking this medicine. This drug may make you feel generally unwell. This is not uncommon, as chemotherapy can affect healthy cells as well as cancer cells. Report any side effects. Continue your course of treatment even though you feel ill unless your doctor tells you to stop. Your urine may turn red for a few days after your dose. This is not blood. If your urine is dark or brown, call your doctor. In some cases, you may be given additional medicines to help with side effects. Follow all directions for their use. Call your doctor or health care professional for advice if you get a  fever, chills or sore throat, or other symptoms of  a cold or flu. Do not treat yourself. This drug decreases your body's ability to fight infections. Try to avoid being around people who are sick. This medicine may increase your risk to bruise or bleed. Call your doctor or health care professional if you notice any unusual bleeding. Be careful brushing and flossing your teeth or using a toothpick because you may get an infection or bleed more easily. If you have any dental work done, tell your dentist you are receiving this medicine. Avoid taking products that contain aspirin, acetaminophen, ibuprofen, naproxen, or ketoprofen unless instructed by your doctor. These medicines may hide a fever. Men and women of childbearing age should use effective birth control methods while using taking this medicine. Do not become pregnant while taking this medicine. There is a potential for serious side effects to an unborn child. Talk to your health care professional or pharmacist for more information. Do not breast-feed an infant while taking this medicine. Do not let others touch your urine or other body fluids for 5 days after each treatment with this medicine. Caregivers should wear latex gloves to avoid touching body fluids during this time. What side effects may I notice from receiving this medicine? Side effects that you should report to your doctor or health care professional as soon as possible: -allergic reactions like skin rash, itching or hives, swelling of the face, lips, or tongue -low blood counts - this medicine may decrease the number of white blood cells, red blood cells and platelets. You may be at increased risk for infections and bleeding. -signs of infection - fever or chills, cough, sore throat, pain or difficulty passing urine -signs of decreased platelets or bleeding - bruising, pinpoint red spots on the skin, black, tarry stools, blood in the urine -signs of decreased red blood cells -  unusually weak or tired, fainting spells, lightheadedness -breathing problems -chest pain -fast, irregular heartbeat -mouth sores -nausea, vomiting -pain, swelling, redness at site where injected -pain, tingling, numbness in the hands or feet -swelling of ankles, feet, or hands -unusual bleeding or bruising Side effects that usually do not require medical attention (report to your doctor or health care professional if they continue or are bothersome): -diarrhea -facial flushing -hair loss -loss of appetite -missed menstrual periods -nail discoloration or damage -red or watery eyes -red colored urine -stomach upset This list may not describe all possible side effects. Call your doctor for medical advice about side effects. You may report side effects to FDA at 1-800-FDA-1088. Where should I keep my medicine? This drug is given in a hospital or clinic and will not be stored at home. NOTE: This sheet is a summary. It may not cover all possible information. If you have questions about this medicine, talk to your doctor, pharmacist, or health care provider.  2012, [employer]. (08/05/2007 5:07:32 PM)  Cyclophosphamide injection What is this medicine? CYCLOPHOSPHAMIDE (sye kloe FOSS fa mide) is a chemotherapy drug. It slows the growth of cancer cells. This medicine is used to treat many types of cancer like lymphoma, myeloma, leukemia, breast cancer, and ovarian cancer, to name a few. It is also used to treat nephrotic syndrome in children. This medicine may be used for other purposes; ask your health care provider or pharmacist if you have questions. What should I tell my health care provider before I take this medicine? They need to know if you have any of these conditions: -blood disorders -history of other chemotherapy -history of radiation therapy -infection -kidney disease -  liver disease -tumors in the bone marrow -an unusual or allergic reaction to cyclophosphamide,  other chemotherapy, other medicines, foods, dyes, or preservatives -pregnant or trying to get pregnant -breast-feeding How should I use this medicine? This drug is usually given as an injection into a vein or muscle or by infusion into a vein. It is administered in a hospital or clinic by a specially trained health care professional. Talk to your pediatrician regarding the use of this medicine in children. While this drug may be prescribed for selected conditions, precautions do apply. Overdosage: If you think you have taken too much of this medicine contact a poison control center or emergency room at once. NOTE: This medicine is only for you. Do not share this medicine with others. What if I miss a dose? It is important not to miss your dose. Call your doctor or health care professional if you are unable to keep an appointment. What may interact with this medicine? Do not take this medicine with any of the following medications: -mibefradil -nalidixic acid This medicine may also interact with the following medications: -doxorubicin -etanercept -medicines to increase blood counts like filgrastim, pegfilgrastim, sargramostim -medicines that block muscle or nerve pain -St. John's Wort -phenobarbital -succinylcholine chloride -trastuzumab -vaccines Talk to your doctor or health care professional before taking any of these medicines: -acetaminophen -aspirin -ibuprofen -ketoprofen -naproxen This list may not describe all possible interactions. Give your health care provider a list of all the medicines, herbs, non-prescription drugs, or dietary supplements you use. Also tell them if you smoke, drink alcohol, or use illegal drugs. Some items may interact with your medicine. What should I watch for while using this medicine? Visit your doctor for checks on your progress. This drug may make you feel generally unwell. This is not uncommon, as chemotherapy can affect healthy cells as well as  cancer cells. Report any side effects. Continue your course of treatment even though you feel ill unless your doctor tells you to stop. Drink water or other fluids as directed. Urinate often, even at night. In some cases, you may be given additional medicines to help with side effects. Follow all directions for their use. Call your doctor or health care professional for advice if you get a fever, chills or sore throat, or other symptoms of a cold or flu. Do not treat yourself. This drug decreases your body's ability to fight infections. Try to avoid being around people who are sick. This medicine may increase your risk to bruise or bleed. Call your doctor or health care professional if you notice any unusual bleeding. Be careful brushing and flossing your teeth or using a toothpick because you may get an infection or bleed more easily. If you have any dental work done, tell your dentist you are receiving this medicine. Avoid taking products that contain aspirin, acetaminophen, ibuprofen, naproxen, or ketoprofen unless instructed by your doctor. These medicines may hide a fever. Do not become pregnant while taking this medicine. Women should inform their doctor if they wish to become pregnant or think they might be pregnant. There is a potential for serious side effects to an unborn child. Talk to your health care professional or pharmacist for more information. Do not breast-feed an infant while taking this medicine. Men should inform their doctor if they wish to father a child. This medicine may lower sperm counts. If you are going to have surgery, tell your doctor or health care professional that you have taken this medicine. What side effects  may I notice from receiving this medicine? Side effects that you should report to your doctor or health care professional as soon as possible: -allergic reactions like skin rash, itching or hives, swelling of the face, lips, or tongue -low blood counts - this  medicine may decrease the number of white blood cells, red blood cells and platelets. You may be at increased risk for infections and bleeding. -signs of infection - fever or chills, cough, sore throat, pain or difficulty passing urine -signs of decreased platelets or bleeding - bruising, pinpoint red spots on the skin, black, tarry stools, blood in the urine -signs of decreased red blood cells - unusually weak or tired, fainting spells, lightheadedness -breathing problems -dark urine -mouth sores -pain, swelling, redness at site where injected -swelling of the ankles, feet, hands -trouble passing urine or change in the amount of urine -weight gain -yellowing of the eyes or skin Side effects that usually do not require medical attention (report to your doctor or health care professional if they continue or are bothersome): -changes in nail or skin color -diarrhea -hair loss -loss of appetite -missed menstrual periods -nausea, vomiting -stomach pain This list may not describe all possible side effects. Call your doctor for medical advice about side effects. You may report side effects to FDA at 1-800-FDA-1088. Where should I keep my medicine? This drug is given in a hospital or clinic and will not be stored at home. NOTE: This sheet is a summary. It may not cover all possible information. If you have questions about this medicine, talk to your doctor, pharmacist, or health care provider.  2012, [employer]. (07/22/2007 2:32:25 PM)  Paclitaxel injection What is this medicine? PACLITAXEL (PAK li TAX el) is a chemotherapy drug. It targets fast dividing cells, like cancer cells, and causes these cells to die. This medicine is used to treat ovarian cancer, breast cancer, and other cancers. This medicine may be used for other purposes; ask your health care provider or pharmacist if you have questions. What should I tell my health care provider before I take this medicine? They need  to know if you have any of these conditions: -blood disorders -irregular heartbeat -infection (especially a virus infection such as chickenpox, cold sores, or herpes) -liver disease -previous or ongoing radiation therapy -an unusual or allergic reaction to paclitaxel, alcohol, polyoxyethylated castor oil, other chemotherapy agents, other medicines, foods, dyes, or preservatives -pregnant or trying to get pregnant -breast-feeding How should I use this medicine? This drug is given as an infusion into a vein. It is administered in a hospital or clinic by a specially trained health care professional. Talk to your pediatrician regarding the use of this medicine in children. Special care may be needed. Overdosage: If you think you have taken too much of this medicine contact a poison control center or emergency room at once. NOTE: This medicine is only for you. Do not share this medicine with others. What if I miss a dose? It is important not to miss your dose. Call your doctor or health care professional if you are unable to keep an appointment. What may interact with this medicine? Do not take this medicine with any of the following medications: -disulfiram -metronidazole This medicine may also interact with the following medications: -cyclosporine -dexamethasone -diazepam -ketoconazole -medicines to increase blood counts like filgrastim, pegfilgrastim, sargramostim -other chemotherapy drugs like cisplatin, doxorubicin, epirubicin, etoposide, teniposide, vincristine -quinidine -testosterone -vaccines -verapamil Talk to your doctor or health care professional before taking any of  these medicines: -acetaminophen -aspirin -ibuprofen -ketoprofen -naproxen This list may not describe all possible interactions. Give your health care provider a list of all the medicines, herbs, non-prescription drugs, or dietary supplements you use. Also tell them if you smoke, drink alcohol, or use illegal  drugs. Some items may interact with your medicine. What should I watch for while using this medicine? Your condition will be monitored carefully while you are receiving this medicine. You will need important blood work done while you are taking this medicine. This drug may make you feel generally unwell. This is not uncommon, as chemotherapy can affect healthy cells as well as cancer cells. Report any side effects. Continue your course of treatment even though you feel ill unless your doctor tells you to stop. In some cases, you may be given additional medicines to help with side effects. Follow all directions for their use. Call your doctor or health care professional for advice if you get a fever, chills or sore throat, or other symptoms of a cold or flu. Do not treat yourself. This drug decreases your body's ability to fight infections. Try to avoid being around people who are sick. This medicine may increase your risk to bruise or bleed. Call your doctor or health care professional if you notice any unusual bleeding. Be careful brushing and flossing your teeth or using a toothpick because you may get an infection or bleed more easily. If you have any dental work done, tell your dentist you are receiving this medicine. Avoid taking products that contain aspirin, acetaminophen, ibuprofen, naproxen, or ketoprofen unless instructed by your doctor. These medicines may hide a fever. Do not become pregnant while taking this medicine. Women should inform their doctor if they wish to become pregnant or think they might be pregnant. There is a potential for serious side effects to an unborn child. Talk to your health care professional or pharmacist for more information. Do not breast-feed an infant while taking this medicine. Men are advised not to father a child while receiving this medicine. What side effects may I notice from receiving this medicine? Side effects that you should report to your doctor or  health care professional as soon as possible: -allergic reactions like skin rash, itching or hives, swelling of the face, lips, or tongue -low blood counts - This drug may decrease the number of white blood cells, red blood cells and platelets. You may be at increased risk for infections and bleeding. -signs of infection - fever or chills, cough, sore throat, pain or difficulty passing urine -signs of decreased platelets or bleeding - bruising, pinpoint red spots on the skin, black, tarry stools, nosebleeds -signs of decreased red blood cells - unusually weak or tired, fainting spells, lightheadedness -breathing problems -chest pain -high or low blood pressure -mouth sores -nausea and vomiting -pain, swelling, redness or irritation at the injection site -pain, tingling, numbness in the hands or feet -slow or irregular heartbeat -swelling of the ankle, feet, hands Side effects that usually do not require medical attention (report to your doctor or health care professional if they continue or are bothersome): -bone pain -complete hair loss including hair on your head, underarms, pubic hair, eyebrows, and eyelashes -changes in the color of fingernails -diarrhea -loosening of the fingernails -loss of appetite -muscle or joint pain -red flush to skin -sweating This list may not describe all possible side effects. Call your doctor for medical advice about side effects. You may report side effects to FDA at 1-800-FDA-1088. Where  should I keep my medicine? This drug is given in a hospital or clinic and will not be stored at home. NOTE: This sheet is a summary. It may not cover all possible information. If you have questions about this medicine, talk to your doctor, pharmacist, or health care provider.  2012, [employer]. (03/29/2008 11:54:26 AM)  Pegfilgrastim injection What is this medicine? PEGFILGRASTIM (peg fil GRA stim) helps the body make more white blood cells. It is used  to prevent infection in people with low amounts of white blood cells following cancer treatment. This medicine may be used for other purposes; ask your health care provider or pharmacist if you have questions. What should I tell my health care provider before I take this medicine? They need to know if you have any of these conditions: -sickle cell disease -an unusual or allergic reaction to pegfilgrastim, filgrastim, E.coli protein, other medicines, foods, dyes, or preservatives -pregnant or trying to get pregnant -breast-feeding How should I use this medicine? This medicine is for injection under the skin. It is usually given by a health care professional in a hospital or clinic setting. If you get this medicine at home, you will be taught how to prepare and give this medicine. Do not shake this medicine. Use exactly as directed. Take your medicine at regular intervals. Do not take your medicine more often than directed. It is important that you put your used needles and syringes in a special sharps container. Do not put them in a trash can. If you do not have a sharps container, call your pharmacist or healthcare provider to get one. Talk to your pediatrician regarding the use of this medicine in children. While this drug may be prescribed for children who weigh more than 45 kg for selected conditions, precautions do apply Overdosage: If you think you have taken too much of this medicine contact a poison control center or emergency room at once. NOTE: This medicine is only for you. Do not share this medicine with others. What if I miss a dose? If you miss a dose, take it as soon as you can. If it is almost time for your next dose, take only that dose. Do not take double or extra doses. What may interact with this medicine? -lithium -medicines for growth therapy This list may not describe all possible interactions. Give your health care provider a list of all the medicines, herbs,  non-prescription drugs, or dietary supplements you use. Also tell them if you smoke, drink alcohol, or use illegal drugs. Some items may interact with your medicine. What should I watch for while using this medicine? Visit your doctor for regular check ups. You will need important blood work done while you are taking this medicine. What side effects may I notice from receiving this medicine? Side effects that you should report to your doctor or health care professional as soon as possible: -allergic reactions like skin rash, itching or hives, swelling of the face, lips, or tongue -breathing problems -fever -pain, redness, or swelling where injected -shoulder pain -stomach or side pain Side effects that usually do not require medical attention (report to your doctor or health care professional if they continue or are bothersome): -aches, pains -headache -loss of appetite -nausea, vomiting -unusually tired This list may not describe all possible side effects. Call your doctor for medical advice about side effects. You may report side effects to FDA at 1-800-FDA-1088. Where should I keep my medicine? Keep out of the reach of children.  Store in a refrigerator between 2 and 8 degrees C (36 and 46 degrees F). Do not freeze. Keep in carton to protect from light. Throw away this medicine if it is left out of the refrigerator for more than 48 hours. Throw away any unused medicine after the expiration date. NOTE: This sheet is a summary. It may not cover all possible information. If you have questions about this medicine, talk to your doctor, pharmacist, or health care provider.  2012, [employer]. (11/17/2007 3:41:44 PM)
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
## 2012-02-18 ENCOUNTER — Telehealth: Payer: Self-pay | Admitting: *Deleted

## 2012-02-18 ENCOUNTER — Encounter: Payer: Self-pay | Admitting: Oncology

## 2012-02-18 NOTE — Telephone Encounter (Signed)
Made patient weekly appointments with NP starting on 03-06-2012  Add on patient's injections after every treatment   Sent michelle email to set up patient's treatment

## 2012-02-18 NOTE — Progress Notes (Signed)
Put disability form in registration desk. °

## 2012-02-18 NOTE — Telephone Encounter (Signed)
Per staff message and POF I have scheduled appts.  JMW  

## 2012-02-19 ENCOUNTER — Telehealth: Payer: Self-pay | Admitting: Oncology

## 2012-02-19 IMAGING — CT CT ABD-PELV W/ CM
1 of 2 series · 15 of 32 positions shown, 19 images · IV contrast (omnipaque)
Comparison: None.

CLINICAL DATA: Abdominal pain, head the vaginal bleeding.

CT ABDOMEN AND PELVIS WITH CONTRAST
TECHNIQUE: Multidetector CT imaging of the abdomen and pelvis was
performed following the standard protocol during bolus
administration of intravenous contrast.
Contrast: 100 ml Omnipaque 300

[Series 2: rtn ap with st · axial · 0.62mm/px · z∈[-458,-38]mm · 15 of 92 slices shown, 19 images]
[im 4/92  soft-tissue]
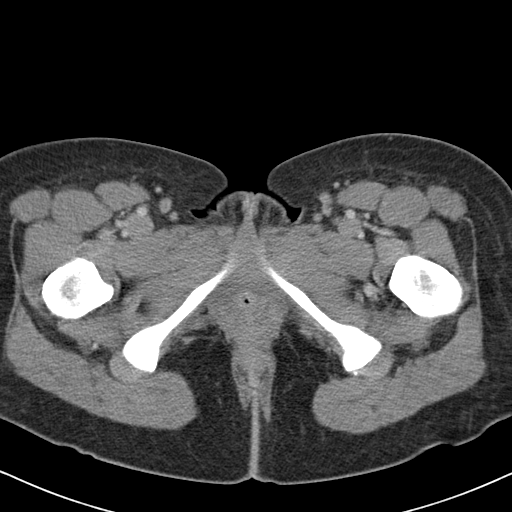
[im 4/92  bone]
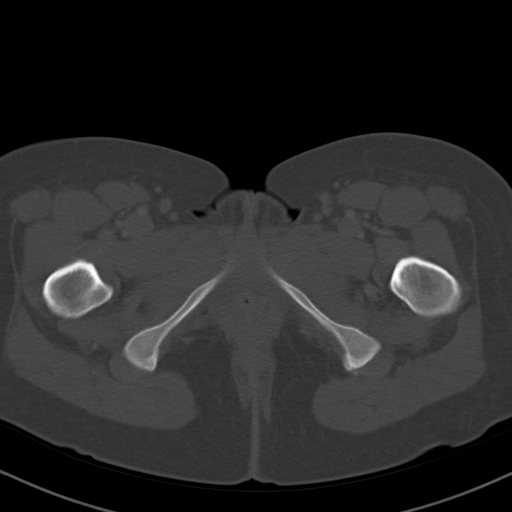
[im 12/92  soft-tissue]
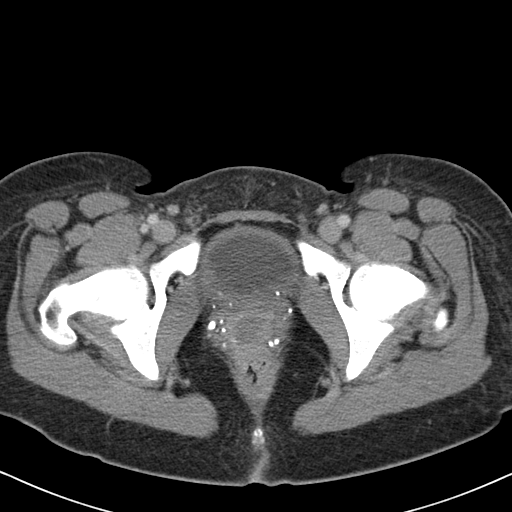
[im 19/92  soft-tissue]
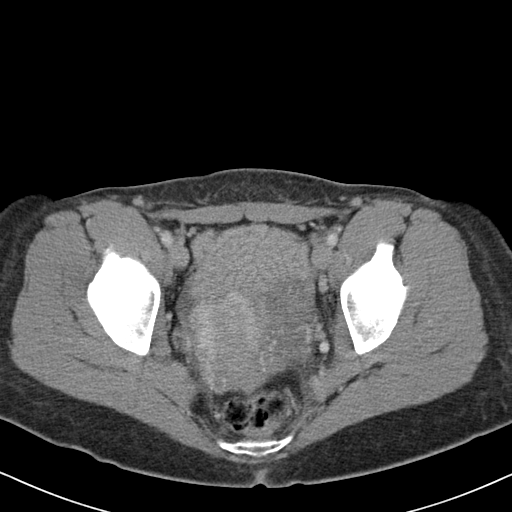
[im 27/92  soft-tissue]
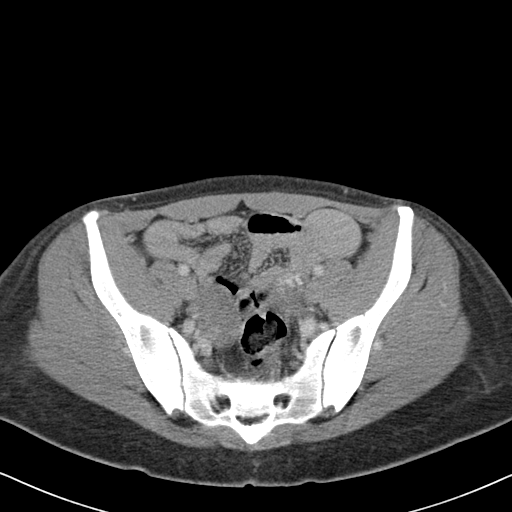
[im 31/92  soft-tissue]
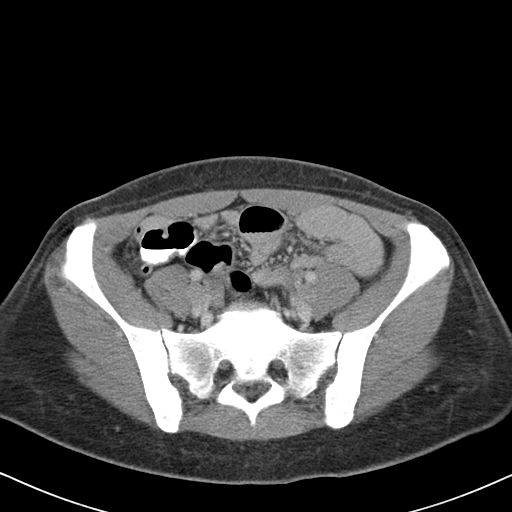
[im 38/92  soft-tissue]
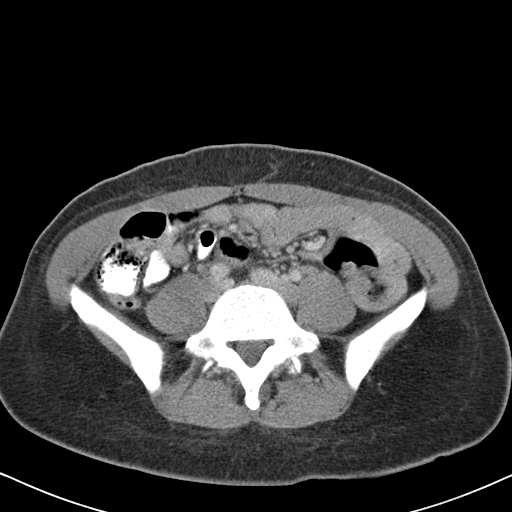
[im 46/92  soft-tissue]
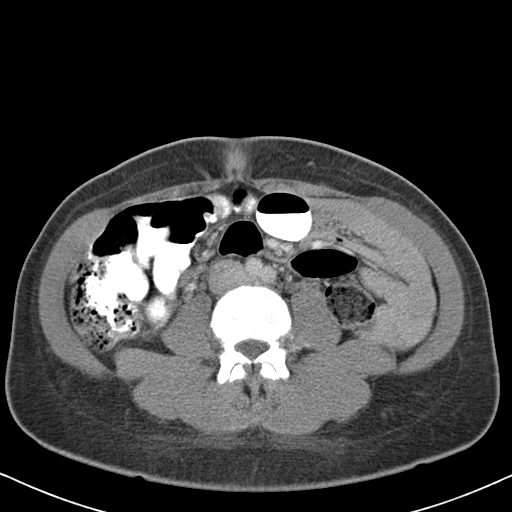
[im 54/92  soft-tissue]
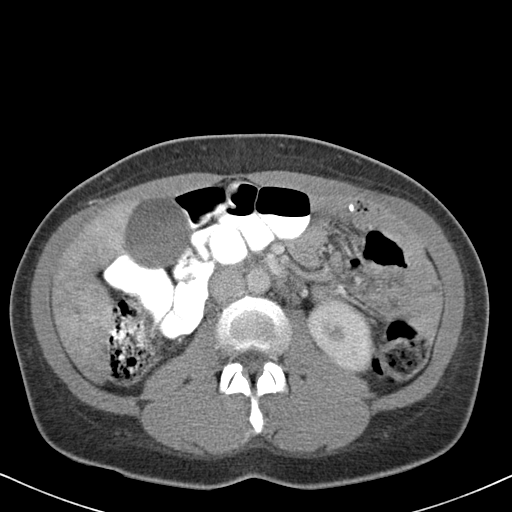
[im 61/92  soft-tissue]
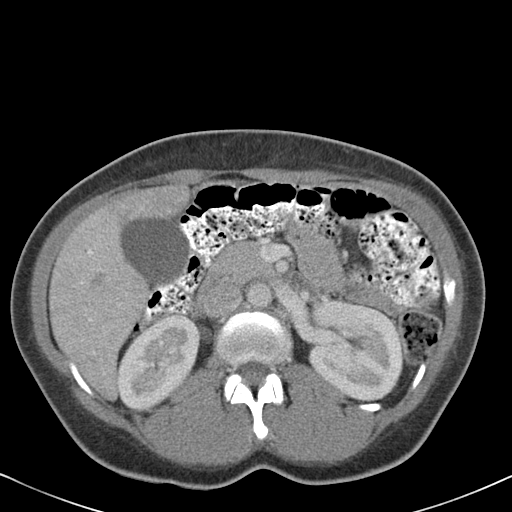
[im 61/92  bone]
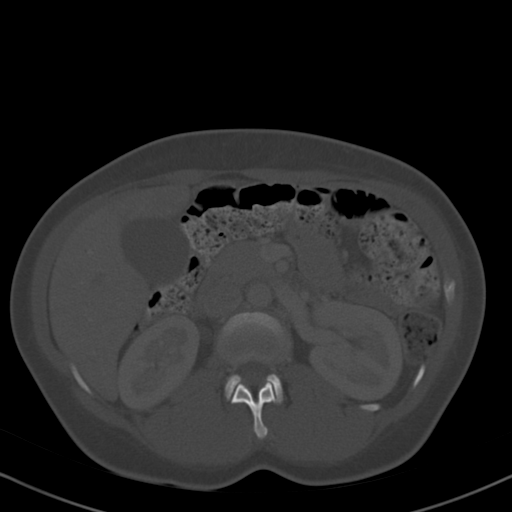
[im 65/92  soft-tissue]
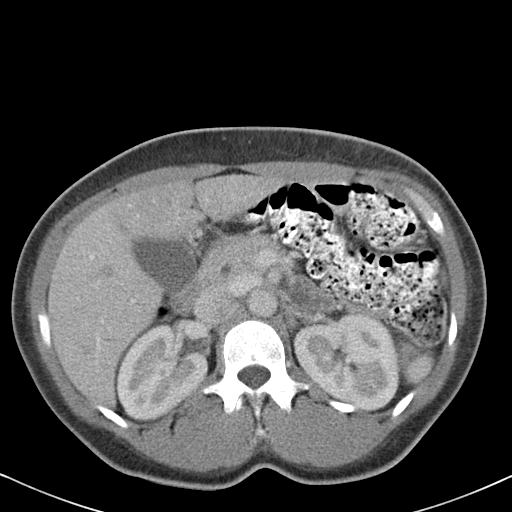
[im 73/92  soft-tissue]
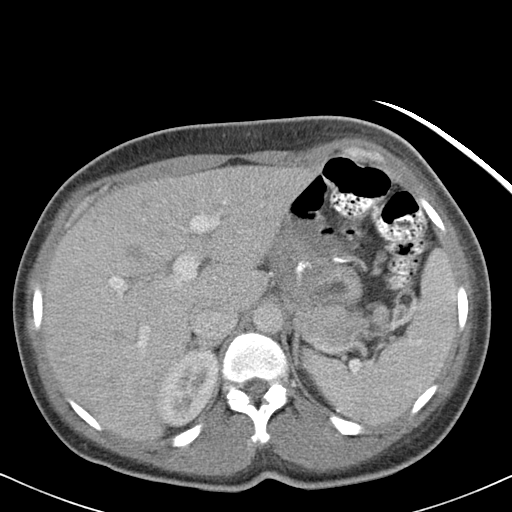
[im 76/92  lung]
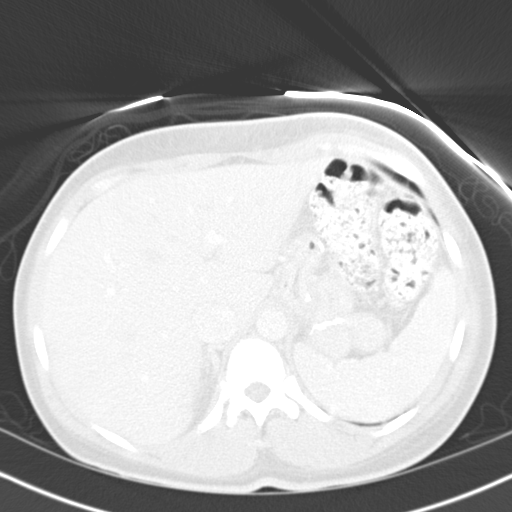
[im 80/92  soft-tissue]
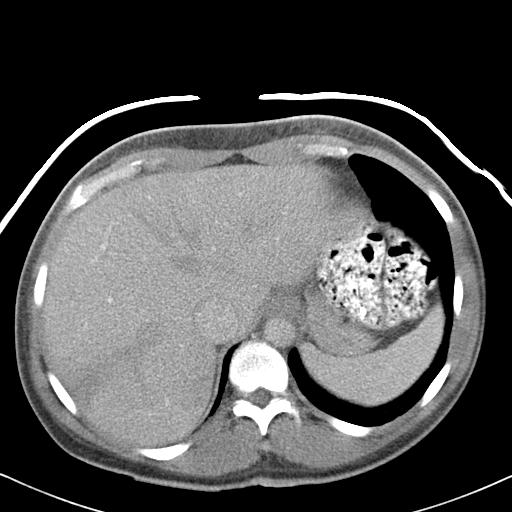
[im 80/92  lung]
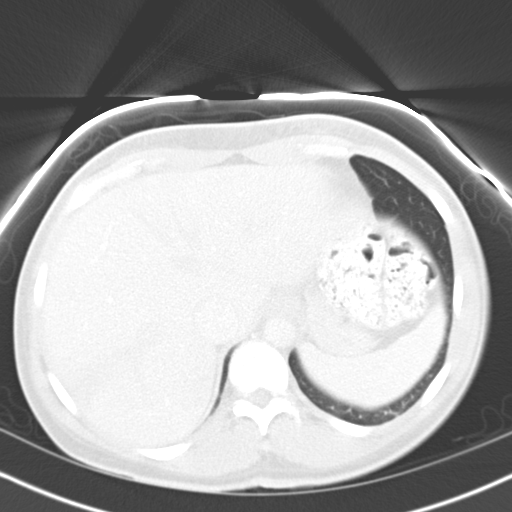
[im 84/92  lung]
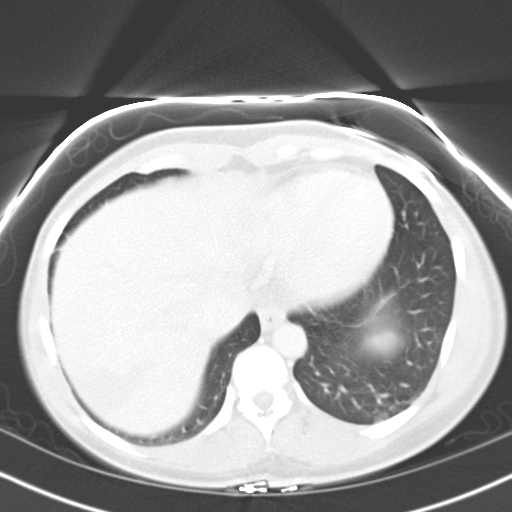
[im 88/92  soft-tissue]
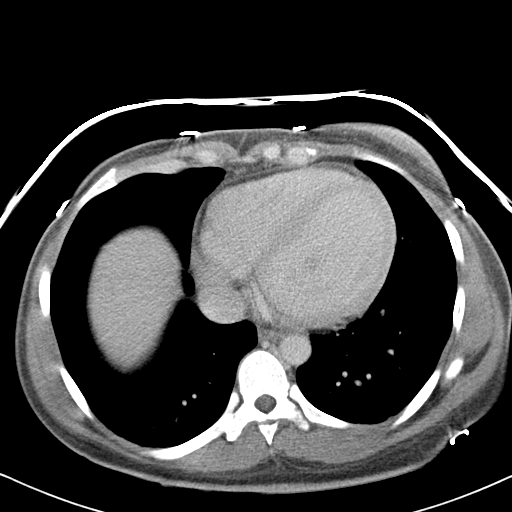
[im 88/92  lung]
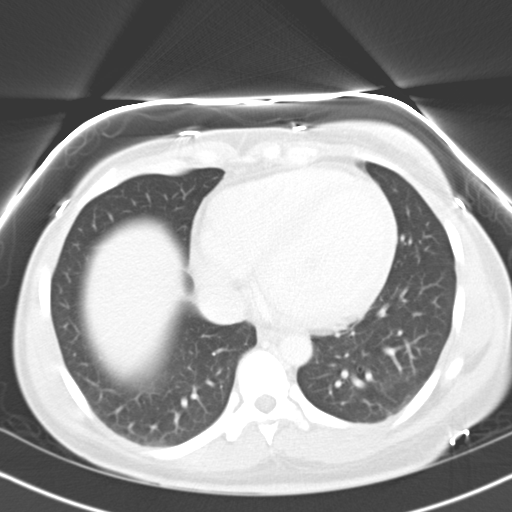

[15 of 32 positions shown; findings below may reference images not displayed]

FINDINGS: Lung bases are clear.  No pericardial fluid.  There is a
cleft-like low density triangular lesion within the right hepatic
lobe which is hypoenhancing.  This may represent site of prior
trauma.  The gallbladder is mildly distended.  No pericholecystic
fluid.  There is mild periportal edema within the liver.  This may
relate to vascular resuscitation.  The pancreas, spleen, adrenal
glands, and kidneys are normal.

The stomach demonstrates postsurgical anatomy consistent with
gastric bypass.  No evidence of obstruction.  The small bowel
appears normal.  The appendix is normal.  The colon and
rectosigmoid colon are normal.

Abdominal aorta is normal caliber.  No evidence retroperitoneal
lymphadenopathy.

No free fluid the pelvis.  The endometrium is low density that
compared the uterus.  This may relate to menses.  There is a
cm cystic lesion within the left ovary with a thin rim of
enhancement.  This has simple fluid attenuation and likely
represents a functional ovarian cyst.  Bladder is mildly thick-
walled.  No evidence of pelvic lymphadenopathy. Review of  bone
windows demonstrates no aggressive osseous lesions.
IMPRESSION: 1.  3.7 cm cyst within the left ovary likely represents a
functional ovarian cyst.  Endometrium is ill-defined and likely
relates to menses.  In patient with anemia and vaginal bleeding,
consider a pelvic ultrasound for further evaluation.

2.  Mild periportal edema likely secondary to IV fluid
resuscitation.
3.  A cleft-like lesion within the liver,  query prior trauma.

## 2012-02-19 NOTE — Telephone Encounter (Signed)
gve the pt her nov,dec 2013 appt calendar °

## 2012-02-25 ENCOUNTER — Telehealth: Payer: Self-pay | Admitting: *Deleted

## 2012-02-25 NOTE — Telephone Encounter (Signed)
Per patient request moved 03-26-2012 appointment to 03-24-2012 patient stated she will be traveling for the thanksgiving holiday

## 2012-02-28 ENCOUNTER — Ambulatory Visit: Payer: PRIVATE HEALTH INSURANCE

## 2012-03-03 ENCOUNTER — Ambulatory Visit: Payer: PRIVATE HEALTH INSURANCE | Attending: Oncology | Admitting: Physical Therapy

## 2012-03-03 DIAGNOSIS — M25619 Stiffness of unspecified shoulder, not elsewhere classified: Secondary | ICD-10-CM | POA: Insufficient documentation

## 2012-03-03 DIAGNOSIS — IMO0001 Reserved for inherently not codable concepts without codable children: Secondary | ICD-10-CM | POA: Insufficient documentation

## 2012-03-03 DIAGNOSIS — C50919 Malignant neoplasm of unspecified site of unspecified female breast: Secondary | ICD-10-CM | POA: Insufficient documentation

## 2012-03-05 ENCOUNTER — Telehealth: Payer: Self-pay | Admitting: *Deleted

## 2012-03-05 ENCOUNTER — Encounter: Payer: Self-pay | Admitting: *Deleted

## 2012-03-05 ENCOUNTER — Telehealth: Payer: Self-pay | Admitting: Oncology

## 2012-03-05 ENCOUNTER — Ambulatory Visit: Payer: PRIVATE HEALTH INSURANCE

## 2012-03-05 DIAGNOSIS — C50519 Malignant neoplasm of lower-outer quadrant of unspecified female breast: Secondary | ICD-10-CM

## 2012-03-05 NOTE — Telephone Encounter (Signed)
Per MD, called notified pt she will receive call from scheduling concerning an appt for [place] Scan. Pt verbalized understanding. ONC tx sent

## 2012-03-05 NOTE — Telephone Encounter (Signed)
Thanks

## 2012-03-05 NOTE — Telephone Encounter (Signed)
Message from [place], [place] pt called with c/o sinus congestion, no fever appt today -chemo at 1445-without provider visit Returned call to pt, verified pt's appt is 03/06/12.  Pt has an Ortho appt with MD at [place] office today.  Pt denied fever, non productive cough, greenish nasal drainage started on Tuesday nightafter having her carpets cleaned, heacdache and face hurts. Review with MD, pt to use saline sinus rinse, and steam to help open/clear nasal passages. Encouraged fluids and chicken noodle soup. Pt to continue to monitor for s/s fever. F/U with MD as scheduled.  Pt verbalized understanding.

## 2012-03-05 NOTE — Telephone Encounter (Signed)
Staff message from [facility]

## 2012-03-05 NOTE — Telephone Encounter (Signed)
S/w the pt and she is aware of her muga scan appt on 03/10/2012@10 :45am

## 2012-03-06 ENCOUNTER — Telehealth: Payer: Self-pay | Admitting: *Deleted

## 2012-03-06 ENCOUNTER — Encounter: Payer: Self-pay | Admitting: Oncology

## 2012-03-06 ENCOUNTER — Ambulatory Visit (HOSPITAL_BASED_OUTPATIENT_CLINIC_OR_DEPARTMENT_OTHER): Payer: PRIVATE HEALTH INSURANCE

## 2012-03-06 ENCOUNTER — Other Ambulatory Visit: Payer: Self-pay | Admitting: *Deleted

## 2012-03-06 ENCOUNTER — Ambulatory Visit (HOSPITAL_BASED_OUTPATIENT_CLINIC_OR_DEPARTMENT_OTHER): Payer: PRIVATE HEALTH INSURANCE | Admitting: Oncology

## 2012-03-06 ENCOUNTER — Other Ambulatory Visit (HOSPITAL_BASED_OUTPATIENT_CLINIC_OR_DEPARTMENT_OTHER): Payer: PRIVATE HEALTH INSURANCE | Admitting: Lab

## 2012-03-06 ENCOUNTER — Other Ambulatory Visit: Payer: Self-pay | Admitting: Certified Registered Nurse Anesthetist

## 2012-03-06 VITALS — BP 99/66 | HR 90 | Temp 98.6°F | Resp 20 | Ht 66.0 in | Wt 157.1 lb

## 2012-03-06 DIAGNOSIS — C773 Secondary and unspecified malignant neoplasm of axilla and upper limb lymph nodes: Secondary | ICD-10-CM

## 2012-03-06 DIAGNOSIS — R509 Fever, unspecified: Secondary | ICD-10-CM

## 2012-03-06 DIAGNOSIS — C50519 Malignant neoplasm of lower-outer quadrant of unspecified female breast: Secondary | ICD-10-CM

## 2012-03-06 DIAGNOSIS — I959 Hypotension, unspecified: Secondary | ICD-10-CM

## 2012-03-06 DIAGNOSIS — J019 Acute sinusitis, unspecified: Secondary | ICD-10-CM

## 2012-03-06 DIAGNOSIS — C50919 Malignant neoplasm of unspecified site of unspecified female breast: Secondary | ICD-10-CM

## 2012-03-06 LAB — COMPREHENSIVE METABOLIC PANEL (CC13)
AST: 58 U/L — ABNORMAL HIGH (ref 5–34)
BUN: 10 mg/dL (ref 7.0–26.0)
Calcium: 9.7 mg/dL (ref 8.4–10.4)
Chloride: 104 mEq/L (ref 98–107)
Creatinine: 0.9 mg/dL (ref 0.6–1.1)
Total Bilirubin: 0.46 mg/dL (ref 0.20–1.20)

## 2012-03-06 LAB — CBC WITH DIFFERENTIAL/PLATELET
BASO%: 0.4 % (ref 0.0–2.0)
EOS%: 1 % (ref 0.0–7.0)
HCT: 42 % (ref 34.8–46.6)
LYMPH%: 31.5 % (ref 14.0–49.7)
MCH: 25.6 pg (ref 25.1–34.0)
MCHC: 32.9 g/dL (ref 31.5–36.0)
MCV: 77.8 fL — ABNORMAL LOW (ref 79.5–101.0)
MONO#: 0.6 10*3/uL (ref 0.1–0.9)
MONO%: 11.3 % (ref 0.0–14.0)
NEUT%: 55.8 % (ref 38.4–76.8)
Platelets: 223 10*3/uL (ref 145–400)
RBC: 5.4 10*6/uL (ref 3.70–5.45)
nRBC: 0 % (ref 0–0)

## 2012-03-06 MED ORDER — SODIUM CHLORIDE 0.9 % IJ SOLN
10.0000 mL | INTRAMUSCULAR | Status: DC | PRN
  Administered 2012-03-06: 10 mL via INTRAVENOUS
  Filled 2012-03-06: qty 10

## 2012-03-06 MED ORDER — AZITHROMYCIN 250 MG PO TABS: ORAL_TABLET | ORAL | Status: DC

## 2012-03-06 MED ORDER — SODIUM CHLORIDE 0.9 % IV SOLN
INTRAVENOUS | Status: DC
  Administered 2012-03-06: 12:00:00 via INTRAVENOUS

## 2012-03-06 MED ORDER — HEPARIN SOD (PORK) LOCK FLUSH 100 UNIT/ML IV SOLN
500.0000 [IU] | Freq: Once | INTRAVENOUS | Status: AC
  Administered 2012-03-06: 500 [IU] via INTRAVENOUS
  Filled 2012-03-06: qty 5

## 2012-03-06 NOTE — Patient Instructions (Addendum)
IVF today  We will see you back on 11/14 for cycle 1 of chemotherapy

## 2012-03-06 NOTE — Patient Instructions (Addendum)
[facility] Discharge Instructions for Patients Receiving Chemotherapy  Today you received the following IVF's.  To help prevent nausea and vomiting after your treatment, we encourage you to take your nausea medication as prescribed.   If you develop nausea and vomiting that is not controlled by your nausea medication, call the clinic. If it is after clinic hours your family physician or the after hours number for the clinic or go to the Emergency Department.   BELOW ARE SYMPTOMS THAT SHOULD BE REPORTED IMMEDIATELY:  *FEVER GREATER THAN 100.5 F  *CHILLS WITH OR WITHOUT FEVER  NAUSEA AND VOMITING THAT IS NOT CONTROLLED WITH YOUR NAUSEA MEDICATION  *UNUSUAL SHORTNESS OF BREATH  *UNUSUAL BRUISING OR BLEEDING  TENDERNESS IN MOUTH AND THROAT WITH OR WITHOUT PRESENCE OF ULCERS  *URINARY PROBLEMS  *BOWEL PROBLEMS  UNUSUAL RASH Items with * indicate a potential emergency and should be followed up as soon as possible.  One of the nurses will contact you 24 hours after your treatment. Please let the nurse know about any problems that you may have experienced. Feel free to call the clinic you have any questions or concerns. The clinic phone number is [phone].   I have been informed and understand all the instructions given to me. I know to contact the clinic, my physician, or go to the Emergency Department if any problems should occur. I do not have any questions at this time, but understand that I may call the clinic during office hours   should I have any questions or need assistance in obtaining follow up care.    __________________________________________  _____________  __________ Signature of Patient or Authorized Representative            Date                   Time    __________________________________________ Nurse's Signature
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
## 2012-03-06 NOTE — Telephone Encounter (Signed)
patient will not be getting treatment on 03-06-2012 cancelled injection chemo will start on 03-13-2012  Sent michelle email to set up patient's treatment for 03-13-2012

## 2012-03-07 ENCOUNTER — Ambulatory Visit: Payer: PRIVATE HEALTH INSURANCE

## 2012-03-10 ENCOUNTER — Encounter (HOSPITAL_COMMUNITY)
Admission: RE | Admit: 2012-03-10 | Discharge: 2012-03-10 | Disposition: A | Discharge status: Home / Self Care | Payer: PRIVATE HEALTH INSURANCE | Source: Ambulatory Visit | Attending: Oncology | Admitting: Oncology

## 2012-03-10 ENCOUNTER — Ambulatory Visit: Payer: PRIVATE HEALTH INSURANCE

## 2012-03-10 DIAGNOSIS — C50919 Malignant neoplasm of unspecified site of unspecified female breast: Secondary | ICD-10-CM | POA: Insufficient documentation

## 2012-03-10 DIAGNOSIS — C50519 Malignant neoplasm of lower-outer quadrant of unspecified female breast: Secondary | ICD-10-CM

## 2012-03-10 DIAGNOSIS — Z01818 Encounter for other preprocedural examination: Secondary | ICD-10-CM | POA: Insufficient documentation

## 2012-03-10 MED ORDER — TECHNETIUM TC 99M-LABELED RED BLOOD CELLS IV KIT
25.0000 | PACK | Freq: Once | INTRAVENOUS | Status: AC | PRN
  Administered 2012-03-10: 25 via INTRAVENOUS

## 2012-03-12 ENCOUNTER — Ambulatory Visit: Payer: PRIVATE HEALTH INSURANCE

## 2012-03-13 ENCOUNTER — Encounter: Payer: Self-pay | Admitting: Adult Health

## 2012-03-13 ENCOUNTER — Telehealth: Payer: Self-pay | Admitting: *Deleted

## 2012-03-13 ENCOUNTER — Other Ambulatory Visit (HOSPITAL_BASED_OUTPATIENT_CLINIC_OR_DEPARTMENT_OTHER): Payer: PRIVATE HEALTH INSURANCE | Admitting: Lab

## 2012-03-13 ENCOUNTER — Ambulatory Visit (HOSPITAL_BASED_OUTPATIENT_CLINIC_OR_DEPARTMENT_OTHER): Payer: PRIVATE HEALTH INSURANCE | Admitting: Adult Health

## 2012-03-13 ENCOUNTER — Ambulatory Visit (HOSPITAL_BASED_OUTPATIENT_CLINIC_OR_DEPARTMENT_OTHER): Payer: PRIVATE HEALTH INSURANCE

## 2012-03-13 VITALS — BP 122/82 | HR 66 | Temp 98.4°F | Resp 20 | Ht 66.0 in | Wt 163.7 lb

## 2012-03-13 DIAGNOSIS — C773 Secondary and unspecified malignant neoplasm of axilla and upper limb lymph nodes: Secondary | ICD-10-CM

## 2012-03-13 DIAGNOSIS — C50519 Malignant neoplasm of lower-outer quadrant of unspecified female breast: Secondary | ICD-10-CM

## 2012-03-13 DIAGNOSIS — Z5111 Encounter for antineoplastic chemotherapy: Secondary | ICD-10-CM

## 2012-03-13 DIAGNOSIS — Z170 Estrogen receptor positive status [ER+]: Secondary | ICD-10-CM

## 2012-03-13 LAB — COMPREHENSIVE METABOLIC PANEL (CC13)
ALT: 56 U/L — ABNORMAL HIGH (ref 0–55)
AST: 36 U/L — ABNORMAL HIGH (ref 5–34)
Albumin: 3.6 g/dL (ref 3.5–5.0)
Alkaline Phosphatase: 106 U/L (ref 40–150)
BUN: 10 mg/dL (ref 7.0–26.0)
CO2: 24 mEq/L (ref 22–29)
Calcium: 9.5 mg/dL (ref 8.4–10.4)
Chloride: 112 mEq/L — ABNORMAL HIGH (ref 98–107)
Creatinine: 0.8 mg/dL (ref 0.6–1.1)
Glucose: 65 mg/dl — ABNORMAL LOW (ref 70–99)
Potassium: 4.1 mEq/L (ref 3.5–5.1)
Sodium: 142 mEq/L (ref 136–145)
Total Bilirubin: 0.22 mg/dL (ref 0.20–1.20)
Total Protein: 7.4 g/dL (ref 6.4–8.3)

## 2012-03-13 LAB — CBC WITH DIFFERENTIAL/PLATELET
BASO%: 0.3 % (ref 0.0–2.0)
Basophils Absolute: 0 10*3/uL (ref 0.0–0.1)
EOS%: 0.7 % (ref 0.0–7.0)
Eosinophils Absolute: 0 10*3/uL (ref 0.0–0.5)
HCT: 38.9 % (ref 34.8–46.6)
HGB: 12.7 g/dL (ref 11.6–15.9)
LYMPH%: 53.6 % — ABNORMAL HIGH (ref 14.0–49.7)
MCH: 25.1 pg (ref 25.1–34.0)
MCHC: 32.6 g/dL (ref 31.5–36.0)
MCV: 77 fL — ABNORMAL LOW (ref 79.5–101.0)
MONO#: 0.3 10*3/uL (ref 0.1–0.9)
MONO%: 9.9 % (ref 0.0–14.0)
NEUT#: 1.1 10*3/uL — ABNORMAL LOW (ref 1.5–6.5)
NEUT%: 35.5 % — ABNORMAL LOW (ref 38.4–76.8)
Platelets: 266 10*3/uL (ref 145–400)
RBC: 5.05 10*6/uL (ref 3.70–5.45)
RDW: 13.7 % (ref 11.2–14.5)
WBC: 3 10*3/uL — ABNORMAL LOW (ref 3.9–10.3)
lymph#: 1.6 10*3/uL (ref 0.9–3.3)
nRBC: 0 % (ref 0–0)

## 2012-03-13 MED ORDER — SODIUM CHLORIDE 0.9 % IV SOLN
150.0000 mg | Freq: Once | INTRAVENOUS | Status: AC
  Administered 2012-03-13: 150 mg via INTRAVENOUS
  Filled 2012-03-13: qty 5

## 2012-03-13 MED ORDER — SODIUM CHLORIDE 0.9 % IJ SOLN
10.0000 mL | INTRAMUSCULAR | Status: DC | PRN
  Administered 2012-03-13: 10 mL
  Filled 2012-03-13: qty 10

## 2012-03-13 MED ORDER — SODIUM CHLORIDE 0.9 % IV SOLN
600.0000 mg/m2 | Freq: Once | INTRAVENOUS | Status: AC
  Administered 2012-03-13: 1120 mg via INTRAVENOUS
  Filled 2012-03-13: qty 56

## 2012-03-13 MED ORDER — DEXAMETHASONE SODIUM PHOSPHATE 4 MG/ML IJ SOLN
12.0000 mg | Freq: Once | INTRAMUSCULAR | Status: AC
  Administered 2012-03-13: 12 mg via INTRAVENOUS

## 2012-03-13 MED ORDER — UNABLE TO FIND: Status: DC

## 2012-03-13 MED ORDER — SODIUM CHLORIDE 0.9 % IV SOLN
Freq: Once | INTRAVENOUS | Status: AC
  Administered 2012-03-13: 15:00:00 via INTRAVENOUS

## 2012-03-13 MED ORDER — DOXORUBICIN HCL CHEMO IV INJECTION 2 MG/ML
60.0000 mg/m2 | Freq: Once | INTRAVENOUS | Status: AC
  Administered 2012-03-13: 112 mg via INTRAVENOUS
  Filled 2012-03-13: qty 56

## 2012-03-13 MED ORDER — PALONOSETRON HCL INJECTION 0.25 MG/5ML
0.2500 mg | Freq: Once | INTRAVENOUS | Status: AC
  Administered 2012-03-13: 0.25 mg via INTRAVENOUS

## 2012-03-13 MED ORDER — HEPARIN SOD (PORK) LOCK FLUSH 100 UNIT/ML IV SOLN
500.0000 [IU] | Freq: Once | INTRAVENOUS | Status: AC | PRN
  Administered 2012-03-13: 500 [IU]
  Filled 2012-03-13: qty 5

## 2012-03-13 NOTE — Progress Notes (Signed)
Upon initiation of Adriamycin push, pt c/o burning in chest area.  Port demonstrates excellent blood return.  Stopped push.  Kathlee Nations, RN, charge nurse called to assess.  Port again demonstrates excellent blood return.  Hanley Seamen, PharmD at chairside to assess.  Pt states when medication pushed, it feels "warm".  Resumed push with 1 liter NS dripping to gravity during push per pharmacy recommendation.  Adriamycin pushed very slowing with saline dripping.  Pt states she "can feel it, but it is not painful, I just know it is there".  Heather reinforced education to pt on reason for precaution (ie Adriamycin is a vesicant).  Pt tolerated remainder of push without complaint.  Port demonstrated excellent blood return throughout push and at the end of administration-dhp, rn

## 2012-03-13 NOTE — Patient Instructions (Signed)
[facility] Discharge Instructions for Patients Receiving Chemotherapy  Today you received the following chemotherapy agents Adriamycin and Cytoxan  To help prevent nausea and vomiting after your treatment, we encourage you to take your nausea medication Compazine (prochlorperazine) every 6 hours as needed starting tonight at , may take Lorazepam if needed.  May start Zofran (Ondansetron) 48 hours after treatment.   If you develop nausea and vomiting that is not controlled by your nausea medication, call the clinic. If it is after clinic hours your family physician or the after hours number for the clinic or go to the Emergency Department.   BELOW ARE SYMPTOMS THAT SHOULD BE REPORTED IMMEDIATELY:  *FEVER GREATER THAN 100.5 F  *CHILLS WITH OR WITHOUT FEVER  NAUSEA AND VOMITING THAT IS NOT CONTROLLED WITH YOUR NAUSEA MEDICATION  *UNUSUAL SHORTNESS OF BREATH  *UNUSUAL BRUISING OR BLEEDING  TENDERNESS IN MOUTH AND THROAT WITH OR WITHOUT PRESENCE OF ULCERS  *URINARY PROBLEMS  *BOWEL PROBLEMS  UNUSUAL RASH Items with * indicate a potential emergency and should be followed up as soon as possible.  One of the nurses will contact you 24 hours after your treatment. Please let the nurse know about any problems that you may have experienced. Feel free to call the clinic you have any questions or concerns. The clinic phone number is [phone].   I have been informed and understand all the instructions given to me. I know to contact the clinic, my physician, or go to the Emergency Department if any problems should occur. I do not have any questions at this time, but understand that I may call the clinic during office hours   should I have any questions or need assistance in obtaining follow up care.   Adriamycin (Doxorubicin) What is this medicine? DOXORUBICIN (dox oh ROO bi sin) is a chemotherapy drug. It is used to treat many kinds of cancer like Hodgkin's disease,  leukemia, non-Hodgkin's lymphoma, neuroblastoma, sarcoma, and Wilms' tumor. It is also used to treat bladder cancer, breast cancer, lung cancer, ovarian cancer, stomach cancer, and thyroid cancer. This medicine may be used for other purposes; ask your health care provider or pharmacist if you have questions.  What should I tell my health care provider before I take this medicine? They need to know if you have any of these conditions: -blood disorders -heart disease, recent heart attack -infection (especially a virus infection such as chickenpox, cold sores, or herpes) -irregular heartbeat -liver disease -recent or ongoing radiation therapy -an unusual or allergic reaction to doxorubicin, other chemotherapy agents, other medicines, foods, dyes, or preservatives -pregnant or trying to get pregnant -breast-feeding  How should I use this medicine? This drug is given as an infusion into a vein. It is administered in a hospital or clinic by a specially trained health care professional. If you have pain, swelling, burning or any unusual feeling around the site of your injection, tell your health care professional right away. Talk to your pediatrician regarding the use of this medicine in children. Special care may be needed. Overdosage: If you think you have taken too much of this medicine contact a poison control center or emergency room at once. NOTE: This medicine is only for you. Do not share this medicine with others. What if I miss a dose? It is important not to miss your dose. Call your doctor or health care professional if you are unable to keep an appointment.  What may interact with this medicine? Do not take  this medicine with any of the following medications: -cisapride -droperidol -halofantrine -pimozide -zidovudine This medicine may also interact with the following  medications: -chloroquine -chlorpromazine -clarithromycin -cyclophosphamide -cyclosporine -erythromycin -medicines for depression, anxiety, or psychotic disturbances -medicines for irregular heart beat like amiodarone, bepridil, dofetilide, encainide, flecainide, propafenone, quinidine -medicines for seizures like ethotoin, fosphenytoin, phenytoin -medicines for nausea, vomiting like dolasetron, ondansetron, palonosetron -medicines to increase blood counts like filgrastim, pegfilgrastim, sargramostim -methadone -methotrexate -pentamidine -progesterone -vaccines -verapamil  Talk to your doctor or health care professional before taking any of these medicines: -acetaminophen -aspirin -ibuprofen -ketoprofen -naproxen This list may not describe all possible interactions. Give your health care provider a list of all the medicines, herbs, non-prescription drugs, or dietary supplements you use. Also tell them if you smoke, drink alcohol, or use illegal drugs. Some items may interact with your medicine.  What should I watch for while using this medicine? Your condition will be monitored carefully while you are receiving this medicine. You will need important blood work done while you are taking this medicine. This drug may make you feel generally unwell. This is not uncommon, as chemotherapy can affect healthy cells as well as cancer cells. Report any side effects. Continue your course of treatment even though you feel ill unless your doctor tells you to stop. Your urine may turn red for a few days after your dose. This is not blood. If your urine is dark or brown, call your doctor. In some cases, you may be given additional medicines to help with side effects. Follow all directions for their use. Call your doctor or health care professional for advice if you get a fever, chills or sore throat, or other symptoms of a cold or flu. Do not treat yourself. This drug decreases your body's ability  to fight infections. Try to avoid being around people who are sick. This medicine may increase your risk to bruise or bleed. Call your doctor or health care professional if you notice any unusual bleeding. Be careful brushing and flossing your teeth or using a toothpick because you may get an infection or bleed more easily. If you have any dental work done, tell your dentist you are receiving this medicine. Avoid taking products that contain aspirin, acetaminophen, ibuprofen, naproxen, or ketoprofen unless instructed by your doctor. These medicines may hide a fever. Men and women of childbearing age should use effective birth control methods while using taking this medicine. Do not become pregnant while taking this medicine. There is a potential for serious side effects to an unborn child. Talk to your health care professional or pharmacist for more information. Do not breast-feed an infant while taking this medicine. Do not let others touch your urine or other body fluids for 5 days after each treatment with this medicine. Caregivers should wear latex gloves to avoid touching body fluids during this time.  What side effects may I notice from receiving this medicine? Side effects that you should report to your doctor or health care professional as soon as possible: -allergic reactions like skin rash, itching or hives, swelling of the face, lips, or tongue -low blood counts - this medicine may decrease the number of white blood cells, red blood cells and platelets. You may be at increased risk for infections and bleeding. -signs of infection - fever or chills, cough, sore throat, pain or difficulty passing urine -signs of decreased platelets or bleeding - bruising, pinpoint red spots on the skin, black, tarry stools, blood in the urine -signs of  decreased red blood cells - unusually weak or tired, fainting spells, lightheadedness -breathing problems -chest pain -fast, irregular heartbeat -mouth  sores -nausea, vomiting -pain, swelling, redness at site where injected -pain, tingling, numbness in the hands or feet -swelling of ankles, feet, or hands -unusual bleeding or bruising  Side effects that usually do not require medical attention (report to your doctor or health care professional if they continue or are bothersome): -diarrhea -facial flushing -hair loss -loss of appetite -missed menstrual periods -nail discoloration or damage -red or watery eyes -red colored urine -stomach upset This list may not describe all possible side effects. Call your doctor for medical advice about side effects. You may report side effects to FDA at 1-800-FDA-1088. Where should I keep my medicine? This drug is given in a hospital or clinic and will not be stored at home. NOTE: This sheet is a summary. It may not cover all possible information. If you have questions about this medicine, talk to your doctor, pharmacist, or health care provider.  2012, [employer]. (08/05/2007 5:07:32 PM)  Cytoxan (Cyclophosphamide) What is this medicine? CYCLOPHOSPHAMIDE (sye kloe FOSS fa mide) is a chemotherapy drug. It slows the growth of cancer cells. This medicine is used to treat many types of cancer like lymphoma, myeloma, leukemia, breast cancer, and ovarian cancer, to name a few. It is also used to treat nephrotic syndrome in children. This medicine may be used for other purposes; ask your health care provider or pharmacist if you have questions.  What should I tell my health care provider before I take this medicine? They need to know if you have any of these conditions: -blood disorders -history of other chemotherapy -history of radiation therapy -infection -kidney disease -liver disease -tumors in the bone marrow -an unusual or allergic reaction to cyclophosphamide, other chemotherapy, other medicines, foods, dyes, or preservatives -pregnant or trying to get  pregnant -breast-feeding  How should I use this medicine? This drug is usually given as an injection into a vein or muscle or by infusion into a vein. It is administered in a hospital or clinic by a specially trained health care professional. Talk to your pediatrician regarding the use of this medicine in children. While this drug may be prescribed for selected conditions, precautions do apply. Overdosage: If you think you have taken too much of this medicine contact a poison control center or emergency room at once. NOTE: This medicine is only for you. Do not share this medicine with others.  What if I miss a dose? It is important not to miss your dose. Call your doctor or health care professional if you are unable to keep an appointment. What may interact with this medicine? Do not take this medicine with any of the following medications: -mibefradil -nalidixic acid This medicine may also interact with the following medications: -doxorubicin -etanercept -medicines to increase blood counts like filgrastim, pegfilgrastim, sargramostim -medicines that block muscle or nerve pain -St. John's Wort -phenobarbital -succinylcholine chloride -trastuzumab -vaccines Talk to your doctor or health care professional before taking any of these medicines: -acetaminophen -aspirin -ibuprofen -ketoprofen -naproxen This list may not describe all possible interactions. Give your health care provider a list of all the medicines, herbs, non-prescription drugs, or dietary supplements you use. Also tell them if you smoke, drink alcohol, or use illegal drugs. Some items may interact with your medicine.  What should I watch for while using this medicine? Visit your doctor for checks on your progress. This drug may make you  feel generally unwell. This is not uncommon, as chemotherapy can affect healthy cells as well as cancer cells. Report any side effects. Continue your course of treatment even though you  feel ill unless your doctor tells you to stop. Drink water or other fluids as directed. Urinate often, even at night. In some cases, you may be given additional medicines to help with side effects. Follow all directions for their use. Call your doctor or health care professional for advice if you get a fever, chills or sore throat, or other symptoms of a cold or flu. Do not treat yourself. This drug decreases your body's ability to fight infections. Try to avoid being around people who are sick. This medicine may increase your risk to bruise or bleed. Call your doctor or health care professional if you notice any unusual bleeding. Be careful brushing and flossing your teeth or using a toothpick because you may get an infection or bleed more easily. If you have any dental work done, tell your dentist you are receiving this medicine. Avoid taking products that contain aspirin, acetaminophen, ibuprofen, naproxen, or ketoprofen unless instructed by your doctor. These medicines may hide a fever. Do not become pregnant while taking this medicine. Women should inform their doctor if they wish to become pregnant or think they might be pregnant. There is a potential for serious side effects to an unborn child. Talk to your health care professional or pharmacist for more information. Do not breast-feed an infant while taking this medicine. Men should inform their doctor if they wish to father a child. This medicine may lower sperm counts. If you are going to have surgery, tell your doctor or health care professional that you have taken this medicine.  What side effects may I notice from receiving this medicine? Side effects that you should report to your doctor or health care professional as soon as possible: -allergic reactions like skin rash, itching or hives, swelling of the face, lips, or tongue -low blood counts - this medicine may decrease the number of white blood cells, red blood cells and platelets. You  may be at increased risk for infections and bleeding. -signs of infection - fever or chills, cough, sore throat, pain or difficulty passing urine -signs of decreased platelets or bleeding - bruising, pinpoint red spots on the skin, black, tarry stools, blood in the urine -signs of decreased red blood cells - unusually weak or tired, fainting spells, lightheadedness -breathing problems -dark urine -mouth sores -pain, swelling, redness at site where injected -swelling of the ankles, feet, hands -trouble passing urine or change in the amount of urine -weight gain -yellowing of the eyes or skin  Side effects that usually do not require medical attention (report to your doctor or health care professional if they continue or are bothersome): -changes in nail or skin color -diarrhea -hair loss -loss of appetite -missed menstrual periods -nausea, vomiting -stomach pain This list may not describe all possible side effects. Call your doctor for medical advice about side effects. You may report side effects to FDA at 1-800-FDA-1088. Where should I keep my medicine? This drug is given in a hospital or clinic and will not be stored at home. NOTE: This sheet is a summary. It may not cover all possible information. If you have questions about this medicine, talk to your doctor, pharmacist, or health care provider.  2012, [employer]. (07/22/2007 2:32:25 PM)
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
## 2012-03-13 NOTE — Patient Instructions (Addendum)
Proceed with chemotherapy.  Take Claritin 10mg  daily for 3 days-5 days.  You can take Tylenol every 6 hours as needed for pain.  Senokot S is a good stool softener.  You can take 2 tablets daily, and increase to as much as 2 tablets twice daily, or decreaseas needed.  Miralax can also be used if you continue to have constipation.  Please call us if you have any questions or concerns.

## 2012-03-13 NOTE — Telephone Encounter (Signed)
adriamycin cytoxan on 12/2, 12/13, 12/27  Sent michelle email to set up patient treatment

## 2012-03-14 ENCOUNTER — Telehealth: Payer: Self-pay | Admitting: *Deleted

## 2012-03-14 ENCOUNTER — Ambulatory Visit (HOSPITAL_BASED_OUTPATIENT_CLINIC_OR_DEPARTMENT_OTHER): Payer: PRIVATE HEALTH INSURANCE

## 2012-03-14 VITALS — BP 117/72 | HR 84 | Temp 97.5°F

## 2012-03-14 DIAGNOSIS — C773 Secondary and unspecified malignant neoplasm of axilla and upper limb lymph nodes: Secondary | ICD-10-CM

## 2012-03-14 DIAGNOSIS — C50519 Malignant neoplasm of lower-outer quadrant of unspecified female breast: Secondary | ICD-10-CM

## 2012-03-14 MED ORDER — PEGFILGRASTIM INJECTION 6 MG/0.6ML
6.0000 mg | Freq: Once | SUBCUTANEOUS | Status: AC
  Administered 2012-03-14: 6 mg via SUBCUTANEOUS
  Filled 2012-03-14: qty 0.6

## 2012-03-14 NOTE — Telephone Encounter (Signed)
Patient here for Neulasta injection following [facility] chemotherapy treatment.  States she is doing well, does have a headache and slight nausea which is relieved with medication.  Is drinking and eating.  Knows to call  If she has any problems, concerns, or questions.

## 2012-03-17 ENCOUNTER — Ambulatory Visit: Payer: PRIVATE HEALTH INSURANCE | Admitting: Physical Therapy

## 2012-03-19 ENCOUNTER — Ambulatory Visit: Payer: PRIVATE HEALTH INSURANCE

## 2012-03-20 ENCOUNTER — Ambulatory Visit: Payer: PRIVATE HEALTH INSURANCE | Admitting: Adult Health

## 2012-03-20 ENCOUNTER — Telehealth: Payer: Self-pay | Admitting: Medical Oncology

## 2012-03-20 ENCOUNTER — Ambulatory Visit: Payer: PRIVATE HEALTH INSURANCE

## 2012-03-20 ENCOUNTER — Other Ambulatory Visit: Payer: PRIVATE HEALTH INSURANCE | Admitting: Lab

## 2012-03-20 ENCOUNTER — Encounter: Payer: Self-pay | Admitting: *Deleted

## 2012-03-20 NOTE — Telephone Encounter (Signed)
Pt called back, per MD patient to begin maalox 1 tablespoon by mouth 1/2 hour before meals and at bedtime and to continue taking her nausea medication as instructed. Patient verbalized understanding. All questions answered.

## 2012-03-20 NOTE — Telephone Encounter (Signed)
Pt called in to c/o nausea causing burning in her stomach, no vomiting, and uncontrolled by Zofran. Patient is not taking compazine as PRN, she stated "I didn't know I was to take it." Patient educated on alternating between Zofran and Compazine and to use Compazine when it was not time to take Zofran. Patient also educated on eating bland foods in small amounts. Patient with c/o tender spots in mouth but no sores. Patient verbalized understanding. All questions answered. Will review with MD. Patients next appt 03/21/12 with NP.

## 2012-03-20 NOTE — Telephone Encounter (Signed)
Have patient begin maalox 1 table spoon po 1/2 hour before meals and at bedtime and continue anti-emetics

## 2012-03-21 ENCOUNTER — Other Ambulatory Visit (HOSPITAL_BASED_OUTPATIENT_CLINIC_OR_DEPARTMENT_OTHER): Payer: PRIVATE HEALTH INSURANCE

## 2012-03-21 ENCOUNTER — Ambulatory Visit: Payer: PRIVATE HEALTH INSURANCE

## 2012-03-21 ENCOUNTER — Encounter: Payer: Self-pay | Admitting: Adult Health

## 2012-03-21 ENCOUNTER — Ambulatory Visit (HOSPITAL_BASED_OUTPATIENT_CLINIC_OR_DEPARTMENT_OTHER): Payer: PRIVATE HEALTH INSURANCE | Admitting: Adult Health

## 2012-03-21 VITALS — BP 114/77 | HR 80 | Temp 97.3°F | Resp 20 | Ht 66.0 in | Wt 159.8 lb

## 2012-03-21 DIAGNOSIS — C773 Secondary and unspecified malignant neoplasm of axilla and upper limb lymph nodes: Secondary | ICD-10-CM

## 2012-03-21 DIAGNOSIS — C50919 Malignant neoplasm of unspecified site of unspecified female breast: Secondary | ICD-10-CM

## 2012-03-21 DIAGNOSIS — C50519 Malignant neoplasm of lower-outer quadrant of unspecified female breast: Secondary | ICD-10-CM

## 2012-03-21 DIAGNOSIS — R110 Nausea: Secondary | ICD-10-CM

## 2012-03-21 DIAGNOSIS — D709 Neutropenia, unspecified: Secondary | ICD-10-CM

## 2012-03-21 LAB — COMPREHENSIVE METABOLIC PANEL (CC13)
BUN: 11 mg/dL (ref 7.0–26.0)
CO2: 24 mEq/L (ref 22–29)
Creatinine: 0.8 mg/dL (ref 0.6–1.1)
Glucose: 99 mg/dl (ref 70–99)
Total Bilirubin: 0.37 mg/dL (ref 0.20–1.20)
Total Protein: 7.2 g/dL (ref 6.4–8.3)

## 2012-03-21 LAB — CBC WITH DIFFERENTIAL/PLATELET
Eosinophils Absolute: 0 10*3/uL (ref 0.0–0.5)
HCT: 35.4 % (ref 34.8–46.6)
LYMPH%: 57.6 % — ABNORMAL HIGH (ref 14.0–49.7)
MONO#: 0.1 10*3/uL (ref 0.1–0.9)
NEUT#: 0.7 10*3/uL — ABNORMAL LOW (ref 1.5–6.5)
NEUT%: 35.6 % — ABNORMAL LOW (ref 38.4–76.8)
Platelets: 180 10*3/uL (ref 145–400)

## 2012-03-21 MED ORDER — CIPROFLOXACIN HCL 500 MG PO TABS: 500.0000 mg | ORAL_TABLET | Freq: Two times a day (BID) | ORAL | Status: DC

## 2012-03-21 MED ORDER — SODIUM CHLORIDE 0.9 % IJ SOLN
10.0000 mL | INTRAMUSCULAR | Status: DC | PRN
  Administered 2012-03-21: 10 mL via INTRAVENOUS
  Filled 2012-03-21: qty 10

## 2012-03-21 MED ORDER — HEPARIN SOD (PORK) LOCK FLUSH 100 UNIT/ML IV SOLN
500.0000 [IU] | Freq: Once | INTRAVENOUS | Status: AC
  Administered 2012-03-21: 500 [IU] via INTRAVENOUS
  Filled 2012-03-21: qty 5

## 2012-03-21 NOTE — Patient Instructions (Signed)
  Patient Neutropenia Instruction Sheet  Diagnosis: Breast Cancer      Treating Physician: Drue Second, MD  Treatment: 1. Type of chemotherapy: Adriamycin Cytoxan  2. Date of last treatment: 11/14  Last Blood Counts: Lab Results  Component Value Date   WBC 2.1* 03/21/2012   HGB 11.7 03/21/2012   HCT 35.4 03/21/2012   MCV 78.3* 03/21/2012   PLT 180 03/21/2012  ANC 700      Prophylactic Antibiotics: Cipro 500 mg by mouth twice a day Instructions: 1. Monitor temperature and call if fever  greater than 100.5, chills, shaking chills (rigors) 2. Call Physician on-call at [phone] 3. Give him/her symptoms and list of medications that you are taking and your last blood count.  Take 2 senokot and a dose of miralax today for your constipation.
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
## 2012-03-24 ENCOUNTER — Ambulatory Visit: Payer: PRIVATE HEALTH INSURANCE

## 2012-03-24 ENCOUNTER — Other Ambulatory Visit: Payer: PRIVATE HEALTH INSURANCE | Admitting: Lab

## 2012-03-24 ENCOUNTER — Ambulatory Visit: Payer: PRIVATE HEALTH INSURANCE | Admitting: Adult Health

## 2012-03-26 ENCOUNTER — Other Ambulatory Visit: Payer: PRIVATE HEALTH INSURANCE | Admitting: Lab

## 2012-03-26 ENCOUNTER — Ambulatory Visit: Payer: PRIVATE HEALTH INSURANCE | Admitting: Adult Health

## 2012-03-31 ENCOUNTER — Ambulatory Visit (HOSPITAL_BASED_OUTPATIENT_CLINIC_OR_DEPARTMENT_OTHER): Payer: PRIVATE HEALTH INSURANCE

## 2012-03-31 ENCOUNTER — Other Ambulatory Visit (HOSPITAL_BASED_OUTPATIENT_CLINIC_OR_DEPARTMENT_OTHER): Payer: PRIVATE HEALTH INSURANCE | Admitting: Lab

## 2012-03-31 ENCOUNTER — Ambulatory Visit (HOSPITAL_BASED_OUTPATIENT_CLINIC_OR_DEPARTMENT_OTHER): Payer: PRIVATE HEALTH INSURANCE | Admitting: Adult Health

## 2012-03-31 ENCOUNTER — Encounter: Payer: Self-pay | Admitting: Adult Health

## 2012-03-31 VITALS — BP 126/84 | HR 84 | Temp 98.1°F | Resp 20 | Ht 66.0 in | Wt 166.5 lb

## 2012-03-31 DIAGNOSIS — R110 Nausea: Secondary | ICD-10-CM

## 2012-03-31 DIAGNOSIS — C50519 Malignant neoplasm of lower-outer quadrant of unspecified female breast: Secondary | ICD-10-CM

## 2012-03-31 DIAGNOSIS — D702 Other drug-induced agranulocytosis: Secondary | ICD-10-CM

## 2012-03-31 DIAGNOSIS — C773 Secondary and unspecified malignant neoplasm of axilla and upper limb lymph nodes: Secondary | ICD-10-CM

## 2012-03-31 DIAGNOSIS — Z5111 Encounter for antineoplastic chemotherapy: Secondary | ICD-10-CM

## 2012-03-31 LAB — COMPREHENSIVE METABOLIC PANEL (CC13)
ALT: 103 U/L — ABNORMAL HIGH (ref 0–55)
AST: 51 U/L — ABNORMAL HIGH (ref 5–34)
Chloride: 108 mEq/L — ABNORMAL HIGH (ref 98–107)
Creatinine: 0.8 mg/dL (ref 0.6–1.1)
Total Bilirubin: <0.2 mg/dL (ref 0.20–1.20)

## 2012-03-31 LAB — CBC WITH DIFFERENTIAL/PLATELET
Eosinophils Absolute: 0 10*3/uL (ref 0.0–0.5)
HCT: 38.1 % (ref 34.8–46.6)
LYMPH%: 25.4 % (ref 14.0–49.7)
MONO#: 1 10*3/uL — ABNORMAL HIGH (ref 0.1–0.9)
NEUT#: 3.6 10*3/uL (ref 1.5–6.5)
Platelets: 356 10*3/uL (ref 145–400)
RBC: 4.86 10*6/uL (ref 3.70–5.45)
WBC: 6.2 10*3/uL (ref 3.9–10.3)
nRBC: 0 % (ref 0–0)

## 2012-03-31 MED ORDER — CYCLOPHOSPHAMIDE CHEMO INJECTION 1 GM
600.0000 mg/m2 | Freq: Once | INTRAMUSCULAR | Status: AC
  Administered 2012-03-31: 1120 mg via INTRAVENOUS
  Filled 2012-03-31: qty 56

## 2012-03-31 MED ORDER — SODIUM CHLORIDE 0.9 % IJ SOLN
10.0000 mL | INTRAMUSCULAR | Status: DC | PRN
  Administered 2012-03-31: 10 mL
  Filled 2012-03-31: qty 10

## 2012-03-31 MED ORDER — PALONOSETRON HCL INJECTION 0.25 MG/5ML
0.2500 mg | Freq: Once | INTRAVENOUS | Status: AC
  Administered 2012-03-31: 0.25 mg via INTRAVENOUS

## 2012-03-31 MED ORDER — DEXAMETHASONE SODIUM PHOSPHATE 4 MG/ML IJ SOLN
12.0000 mg | Freq: Once | INTRAMUSCULAR | Status: AC
  Administered 2012-03-31: 12 mg via INTRAVENOUS

## 2012-03-31 MED ORDER — SODIUM CHLORIDE 0.9 % IV SOLN
150.0000 mg | Freq: Once | INTRAVENOUS | Status: AC
  Administered 2012-03-31: 150 mg via INTRAVENOUS
  Filled 2012-03-31: qty 5

## 2012-03-31 MED ORDER — HEPARIN SOD (PORK) LOCK FLUSH 100 UNIT/ML IV SOLN
500.0000 [IU] | Freq: Once | INTRAVENOUS | Status: AC | PRN
  Administered 2012-03-31: 500 [IU]
  Filled 2012-03-31: qty 5

## 2012-03-31 MED ORDER — DOXORUBICIN HCL CHEMO IV INJECTION 2 MG/ML
60.0000 mg/m2 | Freq: Once | INTRAVENOUS | Status: AC
  Administered 2012-03-31: 112 mg via INTRAVENOUS
  Filled 2012-03-31: qty 56

## 2012-03-31 MED ORDER — SODIUM CHLORIDE 0.9 % IV SOLN
Freq: Once | INTRAVENOUS | Status: AC
  Administered 2012-03-31: 250 mL via INTRAVENOUS

## 2012-03-31 NOTE — Patient Instructions (Signed)
Doing well.  Proceed with chemotherapy.  We will see you back next week.  Please call us if you have any questions or concerns.    

## 2012-03-31 NOTE — Patient Instructions (Addendum)
[facility] Discharge Instructions for Patients Receiving Chemotherapy  Today you received the following chemotherapy agents adria/ cytoxan  To help prevent nausea and vomiting after your treatment, we encourage you to take your nausea medication  and take it as often as prescribed\   If you develop nausea and vomiting that is not controlled by your nausea medication, call the clinic. If it is after clinic hours your family physician or the after hours number for the clinic or go to the Emergency Department.   BELOW ARE SYMPTOMS THAT SHOULD BE REPORTED IMMEDIATELY:  *FEVER GREATER THAN 100.5 F  *CHILLS WITH OR WITHOUT FEVER  NAUSEA AND VOMITING THAT IS NOT CONTROLLED WITH YOUR NAUSEA MEDICATION  *UNUSUAL SHORTNESS OF BREATH  *UNUSUAL BRUISING OR BLEEDING  TENDERNESS IN MOUTH AND THROAT WITH OR WITHOUT PRESENCE OF ULCERS  *URINARY PROBLEMS  *BOWEL PROBLEMS  UNUSUAL RASH Items with * indicate a potential emergency and should be followed up as soon as possible.  One of the nurses will contact you 24 hours after your treatment. Please let the nurse know about any problems that you may have experienced. Feel free to call the clinic you have any questions or concerns. The clinic phone number is [phone].   I have been informed and understand all the instructions given to me. I know to contact the clinic, my physician, or go to the Emergency Department if any problems should occur. I do not have any questions at this time, but understand that I may call the clinic during office hours   should I have any questions or need assistance in obtaining follow up care.    __________________________________________  _____________  __________ Signature of Patient or Authorized Representative            Date                   Time    __________________________________________ Nurse's Signature
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
## 2012-04-01 ENCOUNTER — Ambulatory Visit (HOSPITAL_BASED_OUTPATIENT_CLINIC_OR_DEPARTMENT_OTHER): Payer: PRIVATE HEALTH INSURANCE

## 2012-04-01 VITALS — BP 125/73 | HR 97 | Temp 98.0°F

## 2012-04-01 DIAGNOSIS — C50519 Malignant neoplasm of lower-outer quadrant of unspecified female breast: Secondary | ICD-10-CM

## 2012-04-01 DIAGNOSIS — Z5189 Encounter for other specified aftercare: Secondary | ICD-10-CM

## 2012-04-01 DIAGNOSIS — C773 Secondary and unspecified malignant neoplasm of axilla and upper limb lymph nodes: Secondary | ICD-10-CM

## 2012-04-01 MED ORDER — PEGFILGRASTIM INJECTION 6 MG/0.6ML
6.0000 mg | Freq: Once | SUBCUTANEOUS | Status: AC
  Administered 2012-04-01: 6 mg via SUBCUTANEOUS
  Filled 2012-04-01: qty 0.6

## 2012-04-03 ENCOUNTER — Ambulatory Visit: Payer: PRIVATE HEALTH INSURANCE | Admitting: Adult Health

## 2012-04-03 ENCOUNTER — Other Ambulatory Visit: Payer: PRIVATE HEALTH INSURANCE | Admitting: Lab

## 2012-04-03 ENCOUNTER — Ambulatory Visit: Payer: PRIVATE HEALTH INSURANCE

## 2012-04-04 ENCOUNTER — Ambulatory Visit: Payer: PRIVATE HEALTH INSURANCE

## 2012-04-07 ENCOUNTER — Ambulatory Visit (HOSPITAL_BASED_OUTPATIENT_CLINIC_OR_DEPARTMENT_OTHER): Payer: PRIVATE HEALTH INSURANCE | Admitting: Oncology

## 2012-04-07 ENCOUNTER — Encounter: Payer: Self-pay | Admitting: Oncology

## 2012-04-07 ENCOUNTER — Other Ambulatory Visit (HOSPITAL_BASED_OUTPATIENT_CLINIC_OR_DEPARTMENT_OTHER): Payer: PRIVATE HEALTH INSURANCE | Admitting: Lab

## 2012-04-07 ENCOUNTER — Telehealth: Payer: Self-pay | Admitting: *Deleted

## 2012-04-07 ENCOUNTER — Ambulatory Visit: Payer: PRIVATE HEALTH INSURANCE

## 2012-04-07 VITALS — BP 117/82 | HR 91 | Temp 98.4°F | Resp 20 | Ht 66.0 in | Wt 162.8 lb

## 2012-04-07 DIAGNOSIS — K5900 Constipation, unspecified: Secondary | ICD-10-CM

## 2012-04-07 DIAGNOSIS — C50519 Malignant neoplasm of lower-outer quadrant of unspecified female breast: Secondary | ICD-10-CM

## 2012-04-07 DIAGNOSIS — C773 Secondary and unspecified malignant neoplasm of axilla and upper limb lymph nodes: Secondary | ICD-10-CM

## 2012-04-07 DIAGNOSIS — R110 Nausea: Secondary | ICD-10-CM

## 2012-04-07 LAB — CBC WITH DIFFERENTIAL/PLATELET
BASO%: 0.6 % (ref 0.0–2.0)
EOS%: 0.5 % (ref 0.0–7.0)
LYMPH%: 24.9 % (ref 14.0–49.7)
MCH: 24.9 pg — ABNORMAL LOW (ref 25.1–34.0)
MCHC: 31.5 g/dL (ref 31.5–36.0)
MONO#: 0.3 10*3/uL (ref 0.1–0.9)
Platelets: 317 10*3/uL (ref 145–400)
RBC: 4.69 10*6/uL (ref 3.70–5.45)
WBC: 5.3 10*3/uL (ref 3.9–10.3)

## 2012-04-07 LAB — COMPREHENSIVE METABOLIC PANEL (CC13)
ALT: 56 U/L — ABNORMAL HIGH (ref 0–55)
AST: 28 U/L (ref 5–34)
Alkaline Phosphatase: 147 U/L (ref 40–150)
CO2: 21 mEq/L — ABNORMAL LOW (ref 22–29)
Creatinine: 0.7 mg/dL (ref 0.6–1.1)
Sodium: 135 mEq/L — ABNORMAL LOW (ref 136–145)
Total Bilirubin: 0.28 mg/dL (ref 0.20–1.20)
Total Protein: 7.4 g/dL (ref 6.4–8.3)

## 2012-04-07 MED ORDER — SODIUM CHLORIDE 0.9 % IJ SOLN
10.0000 mL | INTRAMUSCULAR | Status: DC | PRN
  Administered 2012-04-07: 10 mL via INTRAVENOUS
  Filled 2012-04-07: qty 10

## 2012-04-07 MED ORDER — HEPARIN SOD (PORK) LOCK FLUSH 100 UNIT/ML IV SOLN
500.0000 [IU] | Freq: Once | INTRAVENOUS | Status: AC
  Administered 2012-04-07: 500 [IU] via INTRAVENOUS
  Filled 2012-04-07: qty 5

## 2012-04-07 NOTE — Telephone Encounter (Signed)
As of 04-07-2012 nothing needs to be scheduled

## 2012-04-07 NOTE — Patient Instructions (Addendum)
Doing well  Blood counts look good
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
## 2012-04-09 ENCOUNTER — Ambulatory Visit: Payer: PRIVATE HEALTH INSURANCE | Attending: Oncology | Admitting: Physical Therapy

## 2012-04-09 DIAGNOSIS — C50919 Malignant neoplasm of unspecified site of unspecified female breast: Secondary | ICD-10-CM | POA: Insufficient documentation

## 2012-04-09 DIAGNOSIS — M25619 Stiffness of unspecified shoulder, not elsewhere classified: Secondary | ICD-10-CM | POA: Insufficient documentation

## 2012-04-09 DIAGNOSIS — IMO0001 Reserved for inherently not codable concepts without codable children: Secondary | ICD-10-CM | POA: Insufficient documentation

## 2012-04-10 ENCOUNTER — Other Ambulatory Visit: Payer: PRIVATE HEALTH INSURANCE | Admitting: Lab

## 2012-04-10 ENCOUNTER — Telehealth: Payer: Self-pay | Admitting: Medical Oncology

## 2012-04-10 ENCOUNTER — Ambulatory Visit: Payer: PRIVATE HEALTH INSURANCE | Admitting: Adult Health

## 2012-04-10 NOTE — Telephone Encounter (Signed)
Received fax from [facility]. Will review with MD.

## 2012-04-11 ENCOUNTER — Ambulatory Visit: Payer: PRIVATE HEALTH INSURANCE

## 2012-04-11 ENCOUNTER — Ambulatory Visit (HOSPITAL_BASED_OUTPATIENT_CLINIC_OR_DEPARTMENT_OTHER): Payer: PRIVATE HEALTH INSURANCE

## 2012-04-11 ENCOUNTER — Ambulatory Visit (HOSPITAL_BASED_OUTPATIENT_CLINIC_OR_DEPARTMENT_OTHER): Payer: PRIVATE HEALTH INSURANCE | Admitting: Adult Health

## 2012-04-11 ENCOUNTER — Other Ambulatory Visit (HOSPITAL_BASED_OUTPATIENT_CLINIC_OR_DEPARTMENT_OTHER): Payer: PRIVATE HEALTH INSURANCE | Admitting: Lab

## 2012-04-11 ENCOUNTER — Encounter: Payer: Self-pay | Admitting: Adult Health

## 2012-04-11 VITALS — BP 127/86 | HR 72 | Temp 97.9°F | Resp 20

## 2012-04-11 DIAGNOSIS — Z5111 Encounter for antineoplastic chemotherapy: Secondary | ICD-10-CM

## 2012-04-11 DIAGNOSIS — Z170 Estrogen receptor positive status [ER+]: Secondary | ICD-10-CM

## 2012-04-11 DIAGNOSIS — C50519 Malignant neoplasm of lower-outer quadrant of unspecified female breast: Secondary | ICD-10-CM

## 2012-04-11 DIAGNOSIS — C50919 Malignant neoplasm of unspecified site of unspecified female breast: Secondary | ICD-10-CM

## 2012-04-11 DIAGNOSIS — K5900 Constipation, unspecified: Secondary | ICD-10-CM

## 2012-04-11 DIAGNOSIS — R110 Nausea: Secondary | ICD-10-CM

## 2012-04-11 LAB — COMPREHENSIVE METABOLIC PANEL (CC13)
ALT: 63 U/L — ABNORMAL HIGH (ref 0–55)
CO2: 25 mEq/L (ref 22–29)
Calcium: 9 mg/dL (ref 8.4–10.4)
Chloride: 107 mEq/L (ref 98–107)
Glucose: 83 mg/dl (ref 70–99)
Sodium: 140 mEq/L (ref 136–145)
Total Protein: 6.6 g/dL (ref 6.4–8.3)

## 2012-04-11 LAB — CBC WITH DIFFERENTIAL/PLATELET
BASO%: 0.6 % (ref 0.0–2.0)
EOS%: 0.3 % (ref 0.0–7.0)
HCT: 33.1 % — ABNORMAL LOW (ref 34.8–46.6)
LYMPH%: 21.6 % (ref 14.0–49.7)
MCH: 25.3 pg (ref 25.1–34.0)
MCHC: 32.4 g/dL (ref 31.5–36.0)
MCV: 78.1 fL — ABNORMAL LOW (ref 79.5–101.0)
MONO%: 11.7 % (ref 0.0–14.0)
NEUT%: 65.8 % (ref 38.4–76.8)
Platelets: 264 10*3/uL (ref 145–400)
RBC: 4.24 10*6/uL (ref 3.70–5.45)
WBC: 7.7 10*3/uL (ref 3.9–10.3)

## 2012-04-11 MED ORDER — SODIUM CHLORIDE 0.9 % IV SOLN
600.0000 mg/m2 | Freq: Once | INTRAVENOUS | Status: AC
  Administered 2012-04-11: 1120 mg via INTRAVENOUS
  Filled 2012-04-11: qty 56

## 2012-04-11 MED ORDER — DOXORUBICIN HCL CHEMO IV INJECTION 2 MG/ML
60.0000 mg/m2 | Freq: Once | INTRAVENOUS | Status: AC
  Administered 2012-04-11: 112 mg via INTRAVENOUS
  Filled 2012-04-11: qty 56

## 2012-04-11 MED ORDER — DEXAMETHASONE SODIUM PHOSPHATE 4 MG/ML IJ SOLN
12.0000 mg | Freq: Once | INTRAMUSCULAR | Status: AC
  Administered 2012-04-11: 12 mg via INTRAVENOUS

## 2012-04-11 MED ORDER — HEPARIN SOD (PORK) LOCK FLUSH 100 UNIT/ML IV SOLN
500.0000 [IU] | Freq: Once | INTRAVENOUS | Status: AC | PRN
Start: 2012-04-11 — End: 2012-04-11
  Administered 2012-04-11: 500 [IU]
  Filled 2012-04-11: qty 5

## 2012-04-11 MED ORDER — PALONOSETRON HCL INJECTION 0.25 MG/5ML
0.2500 mg | Freq: Once | INTRAVENOUS | Status: AC
  Administered 2012-04-11: 0.25 mg via INTRAVENOUS

## 2012-04-11 MED ORDER — SODIUM CHLORIDE 0.9 % IJ SOLN
10.0000 mL | INTRAMUSCULAR | Status: DC | PRN
  Administered 2012-04-11: 10 mL
  Filled 2012-04-11: qty 10

## 2012-04-11 MED ORDER — SODIUM CHLORIDE 0.9 % IJ SOLN
10.0000 mL | INTRAMUSCULAR | Status: DC | PRN
  Administered 2012-04-11: 10 mL via INTRAVENOUS
  Filled 2012-04-11: qty 10

## 2012-04-11 MED ORDER — ONDANSETRON 8 MG PO TBDP: 8.0000 mg | ORAL_TABLET | Freq: Three times a day (TID) | ORAL | Status: DC | PRN

## 2012-04-11 MED ORDER — HEPARIN SOD (PORK) LOCK FLUSH 100 UNIT/ML IV SOLN
500.0000 [IU] | Freq: Once | INTRAVENOUS | Status: AC
  Administered 2012-04-11: 500 [IU] via INTRAVENOUS
  Filled 2012-04-11: qty 5

## 2012-04-11 MED ORDER — SODIUM CHLORIDE 0.9 % IV SOLN
Freq: Once | INTRAVENOUS | Status: AC
  Administered 2012-04-11: 12:00:00 via INTRAVENOUS

## 2012-04-11 MED ORDER — SODIUM CHLORIDE 0.9 % IV SOLN
150.0000 mg | Freq: Once | INTRAVENOUS | Status: AC
  Administered 2012-04-11: 150 mg via INTRAVENOUS
  Filled 2012-04-11: qty 5

## 2012-04-11 NOTE — Patient Instructions (Addendum)
[facility] Discharge Instructions for Patients Receiving Chemotherapy  Today you received the following chemotherapy agents Adriamycin and Cytoxan.  To help prevent nausea and vomiting after your treatment, we encourage you to take your nausea medication.   If you develop nausea and vomiting that is not controlled by your nausea medication, call the clinic. If it is after clinic hours your family physician or the after hours number for the clinic or go to the Emergency Department.   BELOW ARE SYMPTOMS THAT SHOULD BE REPORTED IMMEDIATELY:  *FEVER GREATER THAN 100.5 F  *CHILLS WITH OR WITHOUT FEVER  NAUSEA AND VOMITING THAT IS NOT CONTROLLED WITH YOUR NAUSEA MEDICATION  *UNUSUAL SHORTNESS OF BREATH  *UNUSUAL BRUISING OR BLEEDING  TENDERNESS IN MOUTH AND THROAT WITH OR WITHOUT PRESENCE OF ULCERS  *URINARY PROBLEMS  *BOWEL PROBLEMS  UNUSUAL RASH Items with * indicate a potential emergency and should be followed up as soon as possible.  One of the nurses will contact you 24 hours after your treatment. Please let the nurse know about any problems that you may have experienced. Feel free to call the clinic you have any questions or concerns. The clinic phone number is [phone].   I have been informed and understand all the instructions given to me. I know to contact the clinic, my physician, or go to the Emergency Department if any problems should occur. I do not have any questions at this time, but understand that I may call the clinic during office hours   should I have any questions or need assistance in obtaining follow up care.    __________________________________________  _____________  __________ Signature of Patient or Authorized Representative            Date                   Time    __________________________________________ Nurse's Signature    

## 2012-04-11 NOTE — Patient Instructions (Signed)
To see Dr. Milta Deiters PA and have treatment today.  Left port accessed.

## 2012-04-11 NOTE — Patient Instructions (Addendum)
Doing well.  Proceed with chemo.  We will see you next week for labs and an appt.  Please call us if you have any questions or concerns.
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
## 2012-04-12 ENCOUNTER — Ambulatory Visit (HOSPITAL_BASED_OUTPATIENT_CLINIC_OR_DEPARTMENT_OTHER): Payer: PRIVATE HEALTH INSURANCE

## 2012-04-12 VITALS — BP 126/42 | HR 97 | Temp 98.5°F

## 2012-04-12 DIAGNOSIS — C50519 Malignant neoplasm of lower-outer quadrant of unspecified female breast: Secondary | ICD-10-CM

## 2012-04-12 DIAGNOSIS — Z5189 Encounter for other specified aftercare: Secondary | ICD-10-CM

## 2012-04-12 MED ORDER — PEGFILGRASTIM INJECTION 6 MG/0.6ML
6.0000 mg | Freq: Once | SUBCUTANEOUS | Status: AC
  Administered 2012-04-12: 6 mg via SUBCUTANEOUS

## 2012-04-14 ENCOUNTER — Other Ambulatory Visit: Payer: Self-pay | Admitting: Certified Registered Nurse Anesthetist

## 2012-04-14 ENCOUNTER — Telehealth: Payer: Self-pay | Admitting: *Deleted

## 2012-04-14 NOTE — Telephone Encounter (Signed)
Patient confirmed over the phone the new date and time 04-17-2012 with np

## 2012-04-16 ENCOUNTER — Ambulatory Visit: Payer: PRIVATE HEALTH INSURANCE

## 2012-04-17 ENCOUNTER — Other Ambulatory Visit (HOSPITAL_BASED_OUTPATIENT_CLINIC_OR_DEPARTMENT_OTHER): Payer: PRIVATE HEALTH INSURANCE | Admitting: Lab

## 2012-04-17 ENCOUNTER — Ambulatory Visit (HOSPITAL_BASED_OUTPATIENT_CLINIC_OR_DEPARTMENT_OTHER): Payer: PRIVATE HEALTH INSURANCE | Admitting: Adult Health

## 2012-04-17 ENCOUNTER — Ambulatory Visit: Payer: PRIVATE HEALTH INSURANCE

## 2012-04-17 ENCOUNTER — Ambulatory Visit: Payer: PRIVATE HEALTH INSURANCE | Admitting: Adult Health

## 2012-04-17 ENCOUNTER — Other Ambulatory Visit: Payer: Self-pay | Admitting: Adult Health

## 2012-04-17 ENCOUNTER — Encounter: Payer: Self-pay | Admitting: Adult Health

## 2012-04-17 ENCOUNTER — Other Ambulatory Visit: Payer: PRIVATE HEALTH INSURANCE | Admitting: Lab

## 2012-04-17 VITALS — BP 103/72 | HR 99 | Temp 98.6°F | Resp 20 | Ht 66.0 in | Wt 159.2 lb

## 2012-04-17 DIAGNOSIS — K30 Functional dyspepsia: Secondary | ICD-10-CM

## 2012-04-17 DIAGNOSIS — Z170 Estrogen receptor positive status [ER+]: Secondary | ICD-10-CM

## 2012-04-17 DIAGNOSIS — C50519 Malignant neoplasm of lower-outer quadrant of unspecified female breast: Secondary | ICD-10-CM

## 2012-04-17 DIAGNOSIS — R110 Nausea: Secondary | ICD-10-CM

## 2012-04-17 DIAGNOSIS — K5900 Constipation, unspecified: Secondary | ICD-10-CM

## 2012-04-17 LAB — CBC WITH DIFFERENTIAL/PLATELET
BASO%: 0.9 % (ref 0.0–2.0)
Basophils Absolute: 0 10*3/uL (ref 0.0–0.1)
EOS%: 0.4 % (ref 0.0–7.0)
Eosinophils Absolute: 0 10*3/uL (ref 0.0–0.5)
HCT: 34.1 % — ABNORMAL LOW (ref 34.8–46.6)
HGB: 11 g/dL — ABNORMAL LOW (ref 11.6–15.9)
LYMPH%: 12.6 % — ABNORMAL LOW (ref 14.0–49.7)
MCH: 25 pg — ABNORMAL LOW (ref 25.1–34.0)
MCHC: 32.3 g/dL (ref 31.5–36.0)
MCV: 77.5 fL — ABNORMAL LOW (ref 79.5–101.0)
MONO#: 0.1 10*3/uL (ref 0.1–0.9)
MONO%: 1.3 % (ref 0.0–14.0)
NEUT#: 4 10*3/uL (ref 1.5–6.5)
NEUT%: 84.8 % — ABNORMAL HIGH (ref 38.4–76.8)
Platelets: 116 10*3/uL — ABNORMAL LOW (ref 145–400)
RBC: 4.4 10*6/uL (ref 3.70–5.45)
RDW: 14.6 % — ABNORMAL HIGH (ref 11.2–14.5)
WBC: 4.7 10*3/uL (ref 3.9–10.3)
lymph#: 0.6 10*3/uL — ABNORMAL LOW (ref 0.9–3.3)
nRBC: 0 % (ref 0–0)

## 2012-04-17 MED ORDER — OMEPRAZOLE 40 MG PO CPDR: 40.0000 mg | DELAYED_RELEASE_CAPSULE | Freq: Every day | ORAL | Status: DC

## 2012-04-17 NOTE — Patient Instructions (Addendum)
Doing well.  I have prescribed Omeprazole for your burning in your stomach.  We will see you back on 12/27 for your last dose of Adriamycin Cytoxan.

## 2012-04-17 NOTE — Telephone Encounter (Signed)
Dr.bowers on 05-06-2012 at 1:00pm
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
## 2012-04-18 ENCOUNTER — Other Ambulatory Visit: Payer: PRIVATE HEALTH INSURANCE | Admitting: Lab

## 2012-04-18 ENCOUNTER — Ambulatory Visit: Payer: PRIVATE HEALTH INSURANCE

## 2012-04-18 ENCOUNTER — Ambulatory Visit: Payer: PRIVATE HEALTH INSURANCE | Admitting: Oncology

## 2012-04-25 ENCOUNTER — Telehealth: Payer: Self-pay | Admitting: *Deleted

## 2012-04-25 ENCOUNTER — Encounter: Payer: Self-pay | Admitting: Adult Health

## 2012-04-25 ENCOUNTER — Other Ambulatory Visit (HOSPITAL_BASED_OUTPATIENT_CLINIC_OR_DEPARTMENT_OTHER): Payer: PRIVATE HEALTH INSURANCE | Admitting: Lab

## 2012-04-25 ENCOUNTER — Ambulatory Visit (HOSPITAL_BASED_OUTPATIENT_CLINIC_OR_DEPARTMENT_OTHER): Payer: PRIVATE HEALTH INSURANCE | Admitting: Adult Health

## 2012-04-25 ENCOUNTER — Ambulatory Visit (HOSPITAL_BASED_OUTPATIENT_CLINIC_OR_DEPARTMENT_OTHER): Payer: PRIVATE HEALTH INSURANCE

## 2012-04-25 VITALS — BP 124/85 | HR 79 | Temp 98.1°F | Resp 20 | Ht 66.0 in | Wt 165.1 lb

## 2012-04-25 DIAGNOSIS — C773 Secondary and unspecified malignant neoplasm of axilla and upper limb lymph nodes: Secondary | ICD-10-CM

## 2012-04-25 DIAGNOSIS — Z5111 Encounter for antineoplastic chemotherapy: Secondary | ICD-10-CM

## 2012-04-25 DIAGNOSIS — R110 Nausea: Secondary | ICD-10-CM

## 2012-04-25 DIAGNOSIS — C50519 Malignant neoplasm of lower-outer quadrant of unspecified female breast: Secondary | ICD-10-CM

## 2012-04-25 DIAGNOSIS — Z170 Estrogen receptor positive status [ER+]: Secondary | ICD-10-CM

## 2012-04-25 LAB — COMPREHENSIVE METABOLIC PANEL (CC13)
ALT: 18 U/L (ref 0–55)
AST: 18 U/L (ref 5–34)
Alkaline Phosphatase: 123 U/L (ref 40–150)
Creatinine: 0.7 mg/dL (ref 0.6–1.1)
Sodium: 144 mEq/L (ref 136–145)
Total Bilirubin: <0.2 mg/dL (ref 0.20–1.20)
Total Protein: 6.3 g/dL — ABNORMAL LOW (ref 6.4–8.3)

## 2012-04-25 LAB — CBC WITH DIFFERENTIAL/PLATELET
BASO%: 0.5 % (ref 0.0–2.0)
EOS%: 0.1 % (ref 0.0–7.0)
Eosinophils Absolute: 0 10*3/uL (ref 0.0–0.5)
LYMPH%: 10 % — ABNORMAL LOW (ref 14.0–49.7)
MCH: 24.8 pg — ABNORMAL LOW (ref 25.1–34.0)
MCHC: 32.1 g/dL (ref 31.5–36.0)
MCV: 77.4 fL — ABNORMAL LOW (ref 79.5–101.0)
MONO%: 8.1 % (ref 0.0–14.0)
NEUT#: 10.8 10*3/uL — ABNORMAL HIGH (ref 1.5–6.5)
Platelets: 416 10*3/uL — ABNORMAL HIGH (ref 145–400)
RBC: 4.51 10*6/uL (ref 3.70–5.45)
RDW: 16.1 % — ABNORMAL HIGH (ref 11.2–14.5)
nRBC: 0 % (ref 0–0)

## 2012-04-25 MED ORDER — DOXORUBICIN HCL CHEMO IV INJECTION 2 MG/ML
60.0000 mg/m2 | Freq: Once | INTRAVENOUS | Status: AC
  Administered 2012-04-25: 112 mg via INTRAVENOUS
  Filled 2012-04-25: qty 56

## 2012-04-25 MED ORDER — SODIUM CHLORIDE 0.9 % IV SOLN
Freq: Once | INTRAVENOUS | Status: AC
  Administered 2012-04-25: 13:00:00 via INTRAVENOUS

## 2012-04-25 MED ORDER — SODIUM CHLORIDE 0.9 % IV SOLN
600.0000 mg/m2 | Freq: Once | INTRAVENOUS | Status: AC
  Administered 2012-04-25: 1120 mg via INTRAVENOUS
  Filled 2012-04-25: qty 56

## 2012-04-25 MED ORDER — SODIUM CHLORIDE 0.9 % IV SOLN
150.0000 mg | Freq: Once | INTRAVENOUS | Status: AC
  Administered 2012-04-25: 150 mg via INTRAVENOUS
  Filled 2012-04-25: qty 5

## 2012-04-25 MED ORDER — HEPARIN SOD (PORK) LOCK FLUSH 100 UNIT/ML IV SOLN
500.0000 [IU] | Freq: Once | INTRAVENOUS | Status: AC | PRN
  Administered 2012-04-25: 500 [IU]
  Filled 2012-04-25: qty 5

## 2012-04-25 MED ORDER — DEXAMETHASONE SODIUM PHOSPHATE 4 MG/ML IJ SOLN
12.0000 mg | Freq: Once | INTRAMUSCULAR | Status: AC
  Administered 2012-04-25: 12 mg via INTRAVENOUS

## 2012-04-25 MED ORDER — SODIUM CHLORIDE 0.9 % IJ SOLN
10.0000 mL | INTRAMUSCULAR | Status: DC | PRN
  Administered 2012-04-25: 10 mL
  Filled 2012-04-25: qty 10

## 2012-04-25 MED ORDER — PALONOSETRON HCL INJECTION 0.25 MG/5ML
0.2500 mg | Freq: Once | INTRAVENOUS | Status: AC
  Administered 2012-04-25: 0.25 mg via INTRAVENOUS

## 2012-04-25 NOTE — Telephone Encounter (Signed)
Taxol 1/17, 1/24, 1/31, 2/7, 2/14, 2/21, 2/28  Sent michelle email to set up patient treatment
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
## 2012-04-25 NOTE — Patient Instructions (Addendum)
[facility] Discharge Instructions for Patients Receiving Chemotherapy  Today you received the following chemotherapy agents :  Cytoxan, Adriamycin To help prevent nausea and vomiting after your treatment, we encourage you to take your nausea medication, Compazine or Lorazepam. Begin Decadron on 04/26/12 and take for two days. Do not take Zofran until 04/29/12. If you develop nausea and vomiting that is not controlled by your nausea medication, call the clinic. If it is after clinic hours your family physician or the after hours number for the clinic or go to the Emergency Department.   BELOW ARE SYMPTOMS THAT SHOULD BE REPORTED IMMEDIATELY:  *FEVER GREATER THAN 100.5 F  *CHILLS WITH OR WITHOUT FEVER  NAUSEA AND VOMITING THAT IS NOT CONTROLLED WITH YOUR NAUSEA MEDICATION  *UNUSUAL SHORTNESS OF BREATH  *UNUSUAL BRUISING OR BLEEDING  TENDERNESS IN MOUTH AND THROAT WITH OR WITHOUT PRESENCE OF ULCERS  *URINARY PROBLEMS  *BOWEL PROBLEMS  UNUSUAL RASH Items with * indicate a potential emergency and should be followed up as soon as possible.   Please let the nurse know about any problems that you may have experienced. Feel free to call the clinic you have any questions or concerns. The clinic phone number is [phone].   I have been informed and understand all the instructions given to me. I know to contact the clinic, my physician, or go to the Emergency Department if any problems should occur. I do not have any questions at this time, but understand that I may call the clinic during office hours   should I have any questions or need assistance in obtaining follow up care.

## 2012-04-25 NOTE — Patient Instructions (Signed)
Doing well.  Proceed with chemotherapy.  Please call us if you have any questions or concerns.    

## 2012-04-26 ENCOUNTER — Telehealth: Payer: Self-pay | Admitting: *Deleted

## 2012-04-26 ENCOUNTER — Ambulatory Visit (HOSPITAL_BASED_OUTPATIENT_CLINIC_OR_DEPARTMENT_OTHER): Payer: PRIVATE HEALTH INSURANCE

## 2012-04-26 VITALS — BP 124/73 | HR 90 | Temp 98.1°F

## 2012-04-26 DIAGNOSIS — Z5189 Encounter for other specified aftercare: Secondary | ICD-10-CM

## 2012-04-26 DIAGNOSIS — C50519 Malignant neoplasm of lower-outer quadrant of unspecified female breast: Secondary | ICD-10-CM

## 2012-04-26 MED ORDER — PEGFILGRASTIM INJECTION 6 MG/0.6ML
6.0000 mg | Freq: Once | SUBCUTANEOUS | Status: AC
  Administered 2012-04-26: 6 mg via SUBCUTANEOUS

## 2012-04-26 NOTE — Telephone Encounter (Signed)
I gave patient appt calendar for January and February. Patient aware that we are working on appt for Jan 17th.  JW

## 2012-04-26 NOTE — Telephone Encounter (Signed)
Per staff message and POF I have scheduled appts. Appt for Jan 17th to late to start fist time treatment. Message sent back to scheduler to advise new appt.  JMW

## 2012-04-27 ENCOUNTER — Other Ambulatory Visit: Payer: Self-pay | Admitting: Oncology

## 2012-04-28 ENCOUNTER — Other Ambulatory Visit: Payer: Self-pay | Admitting: *Deleted

## 2012-04-28 ENCOUNTER — Telehealth: Payer: Self-pay | Admitting: *Deleted

## 2012-04-28 DIAGNOSIS — C50519 Malignant neoplasm of lower-outer quadrant of unspecified female breast: Secondary | ICD-10-CM

## 2012-04-28 MED ORDER — LORAZEPAM 0.5 MG PO TABS: 0.5000 mg | ORAL_TABLET | Freq: Four times a day (QID) | ORAL | Status: DC | PRN

## 2012-04-28 NOTE — Telephone Encounter (Signed)
Per staff message I have scheduled appt for 1/17.   JMW

## 2012-04-30 HISTORY — PX: BREAST BIOPSY: SHX20

## 2012-05-01 ENCOUNTER — Other Ambulatory Visit (HOSPITAL_BASED_OUTPATIENT_CLINIC_OR_DEPARTMENT_OTHER): Payer: PRIVATE HEALTH INSURANCE | Admitting: Lab

## 2012-05-01 ENCOUNTER — Encounter: Payer: Self-pay | Admitting: Adult Health

## 2012-05-01 ENCOUNTER — Ambulatory Visit (HOSPITAL_BASED_OUTPATIENT_CLINIC_OR_DEPARTMENT_OTHER): Payer: PRIVATE HEALTH INSURANCE | Admitting: Adult Health

## 2012-05-01 VITALS — BP 123/83 | HR 103 | Temp 98.2°F | Resp 20 | Ht 66.0 in | Wt 159.7 lb

## 2012-05-01 DIAGNOSIS — C50519 Malignant neoplasm of lower-outer quadrant of unspecified female breast: Secondary | ICD-10-CM

## 2012-05-01 DIAGNOSIS — C773 Secondary and unspecified malignant neoplasm of axilla and upper limb lymph nodes: Secondary | ICD-10-CM

## 2012-05-01 DIAGNOSIS — Z170 Estrogen receptor positive status [ER+]: Secondary | ICD-10-CM

## 2012-05-01 LAB — COMPREHENSIVE METABOLIC PANEL (CC13)
ALT: 27 U/L (ref 0–55)
AST: 20 U/L (ref 5–34)
Alkaline Phosphatase: 181 U/L — ABNORMAL HIGH (ref 40–150)
Chloride: 105 mEq/L (ref 98–107)
Creatinine: 0.8 mg/dL (ref 0.6–1.1)
Total Bilirubin: 0.32 mg/dL (ref 0.20–1.20)

## 2012-05-01 LAB — CBC WITH DIFFERENTIAL/PLATELET
Basophils Absolute: 0 10*3/uL (ref 0.0–0.1)
Eosinophils Absolute: 0 10*3/uL (ref 0.0–0.5)
HGB: 11.8 g/dL (ref 11.6–15.9)
NEUT#: 16.1 10*3/uL — ABNORMAL HIGH (ref 1.5–6.5)
RDW: 16.9 % — ABNORMAL HIGH (ref 11.2–14.5)
WBC: 17.1 10*3/uL — ABNORMAL HIGH (ref 3.9–10.3)
lymph#: 0.9 10*3/uL (ref 0.9–3.3)

## 2012-05-01 NOTE — Patient Instructions (Addendum)
Paclitaxel injection What is this medicine? PACLITAXEL (PAK li TAX el) is a chemotherapy drug. It targets fast dividing cells, like cancer cells, and causes these cells to die. This medicine is used to treat ovarian cancer, breast cancer, and other cancers. This medicine may be used for other purposes; ask your health care provider or pharmacist if you have questions. What should I tell my health care provider before I take this medicine? They need to know if you have any of these conditions: -blood disorders -irregular heartbeat -infection (especially a virus infection such as chickenpox, cold sores, or herpes) -liver disease -previous or ongoing radiation therapy -an unusual or allergic reaction to paclitaxel, alcohol, polyoxyethylated castor oil, other chemotherapy agents, other medicines, foods, dyes, or preservatives -pregnant or trying to get pregnant -breast-feeding How should I use this medicine? This drug is given as an infusion into a vein. It is administered in a hospital or clinic by a specially trained health care professional. Talk to your pediatrician regarding the use of this medicine in children. Special care may be needed. Overdosage: If you think you have taken too much of this medicine contact a poison control center or emergency room at once. NOTE: This medicine is only for you. Do not share this medicine with others. What if I miss a dose? It is important not to miss your dose. Call your doctor or health care professional if you are unable to keep an appointment. What may interact with this medicine? Do not take this medicine with any of the following medications: -disulfiram -metronidazole This medicine may also interact with the following medications: -cyclosporine -dexamethasone -diazepam -ketoconazole -medicines to increase blood counts like filgrastim, pegfilgrastim, sargramostim -other chemotherapy drugs like cisplatin, doxorubicin, epirubicin, etoposide,  teniposide, vincristine -quinidine -testosterone -vaccines -verapamil Talk to your doctor or health care professional before taking any of these medicines: -acetaminophen -aspirin -ibuprofen -ketoprofen -naproxen This list may not describe all possible interactions. Give your health care provider a list of all the medicines, herbs, non-prescription drugs, or dietary supplements you use. Also tell them if you smoke, drink alcohol, or use illegal drugs. Some items may interact with your medicine. What should I watch for while using this medicine? Your condition will be monitored carefully while you are receiving this medicine. You will need important blood work done while you are taking this medicine. This drug may make you feel generally unwell. This is not uncommon, as chemotherapy can affect healthy cells as well as cancer cells. Report any side effects. Continue your course of treatment even though you feel ill unless your doctor tells you to stop. In some cases, you may be given additional medicines to help with side effects. Follow all directions for their use. Call your doctor or health care professional for advice if you get a fever, chills or sore throat, or other symptoms of a cold or flu. Do not treat yourself. This drug decreases your body's ability to fight infections. Try to avoid being around people who are sick. This medicine may increase your risk to bruise or bleed. Call your doctor or health care professional if you notice any unusual bleeding. Be careful brushing and flossing your teeth or using a toothpick because you may get an infection or bleed more easily. If you have any dental work done, tell your dentist you are receiving this medicine. Avoid taking products that contain aspirin, acetaminophen, ibuprofen, naproxen, or ketoprofen unless instructed by your doctor. These medicines may hide a fever. Do not become pregnant  while taking this medicine. Women should inform their  doctor if they wish to become pregnant or think they might be pregnant. There is a potential for serious side effects to an unborn child. Talk to your health care professional or pharmacist for more information. Do not breast-feed an infant while taking this medicine. Men are advised not to father a child while receiving this medicine. What side effects may I notice from receiving this medicine? Side effects that you should report to your doctor or health care professional as soon as possible: -allergic reactions like skin rash, itching or hives, swelling of the face, lips, or tongue -low blood counts - This drug may decrease the number of white blood cells, red blood cells and platelets. You may be at increased risk for infections and bleeding. -signs of infection - fever or chills, cough, sore throat, pain or difficulty passing urine -signs of decreased platelets or bleeding - bruising, pinpoint red spots on the skin, black, tarry stools, nosebleeds -signs of decreased red blood cells - unusually weak or tired, fainting spells, lightheadedness -breathing problems -chest pain -high or low blood pressure -mouth sores -nausea and vomiting -pain, swelling, redness or irritation at the injection site -pain, tingling, numbness in the hands or feet -slow or irregular heartbeat -swelling of the ankle, feet, hands Side effects that usually do not require medical attention (report to your doctor or health care professional if they continue or are bothersome): -bone pain -complete hair loss including hair on your head, underarms, pubic hair, eyebrows, and eyelashes -changes in the color of fingernails -diarrhea -loosening of the fingernails -loss of appetite -muscle or joint pain -red flush to skin -sweating This list may not describe all possible side effects. Call your doctor for medical advice about side effects. You may report side effects to FDA at 1-800-FDA-1088. Where should I keep my  medicine? This drug is given in a hospital or clinic and will not be stored at home. NOTE: This sheet is a summary. It may not cover all possible information. If you have questions about this medicine, talk to your doctor, pharmacist, or health care provider.  2012, [employer]. (03/29/2008 11:54:26 AM)  Take Decadron 8mg  twice a day the day before your chemo.  Doing well.  Please call us if you have any questions or concerns.
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
## 2012-05-05 ENCOUNTER — Ambulatory Visit: Payer: PRIVATE HEALTH INSURANCE | Admitting: Adult Health

## 2012-05-05 ENCOUNTER — Other Ambulatory Visit: Payer: PRIVATE HEALTH INSURANCE | Admitting: Lab

## 2012-05-07 ENCOUNTER — Telehealth: Payer: Self-pay | Admitting: *Deleted

## 2012-05-07 NOTE — Telephone Encounter (Signed)
Pt call lmovm states," the claritin is not working, my eyes are swollen, nose is running is there anything Dr. Welton Flakes gave do? Switch the claritin to something else, or change the dose. She told me not to take the other allergy medication ( xyzal) with the shot. The claritin alone is not doing it."  Pt advised she had to put ice packs on her eyes yesterday due to swelling and she is using nasal saline spray.Will review with MD

## 2012-05-09 NOTE — Telephone Encounter (Signed)
Per MD, notified pt to call PCP for other options regarding allergies since options MD recommended are not working. Pt verbalized understanding. No further questions

## 2012-05-09 NOTE — Telephone Encounter (Signed)
Pt called requesting what to do with her allergies. Discussed I will give her concerns to MD, will call her with further instructions. Pt verbalized understanding. No further questions

## 2012-05-09 NOTE — Telephone Encounter (Signed)
She needs to see her primary care provider if none of the options we recommending are not working

## 2012-05-15 ENCOUNTER — Other Ambulatory Visit: Payer: Self-pay | Admitting: Oncology

## 2012-05-15 ENCOUNTER — Ambulatory Visit (HOSPITAL_BASED_OUTPATIENT_CLINIC_OR_DEPARTMENT_OTHER): Payer: BC Managed Care – PPO | Admitting: Adult Health

## 2012-05-15 ENCOUNTER — Other Ambulatory Visit (HOSPITAL_BASED_OUTPATIENT_CLINIC_OR_DEPARTMENT_OTHER): Payer: BC Managed Care – PPO | Admitting: Lab

## 2012-05-15 ENCOUNTER — Other Ambulatory Visit: Payer: Self-pay | Admitting: Adult Health

## 2012-05-15 ENCOUNTER — Encounter: Payer: Self-pay | Admitting: Adult Health

## 2012-05-15 VITALS — BP 126/73 | HR 95 | Temp 98.0°F | Resp 20 | Ht 66.0 in | Wt 161.9 lb

## 2012-05-15 DIAGNOSIS — C50519 Malignant neoplasm of lower-outer quadrant of unspecified female breast: Secondary | ICD-10-CM

## 2012-05-15 DIAGNOSIS — Z170 Estrogen receptor positive status [ER+]: Secondary | ICD-10-CM

## 2012-05-15 LAB — COMPREHENSIVE METABOLIC PANEL (CC13)
Albumin: 3.2 g/dL — ABNORMAL LOW (ref 3.5–5.0)
Alkaline Phosphatase: 123 U/L (ref 40–150)
CO2: 24 mEq/L (ref 22–29)
Chloride: 107 mEq/L (ref 98–107)
Glucose: 180 mg/dl — ABNORMAL HIGH (ref 70–99)
Potassium: 3.5 mEq/L (ref 3.5–5.1)
Sodium: 142 mEq/L (ref 136–145)
Total Protein: 7.2 g/dL (ref 6.4–8.3)

## 2012-05-15 LAB — CBC WITH DIFFERENTIAL/PLATELET
Eosinophils Absolute: 0 10*3/uL (ref 0.0–0.5)
HCT: 35.7 % (ref 34.8–46.6)
HGB: 11.8 g/dL (ref 11.6–15.9)
MCH: 25.5 pg (ref 25.1–34.0)
MCHC: 32.9 g/dL (ref 31.5–36.0)
MCV: 77.5 fL — ABNORMAL LOW (ref 79.5–101.0)
MONO#: 0.3 10*3/uL (ref 0.1–0.9)
MONO%: 7.3 % (ref 0.0–14.0)
Platelets: 255 10*3/uL (ref 145–400)

## 2012-05-15 MED ORDER — PROCHLORPERAZINE MALEATE 10 MG PO TABS: 10.0000 mg | ORAL_TABLET | Freq: Four times a day (QID) | ORAL | Status: DC | PRN

## 2012-05-15 MED ORDER — ONDANSETRON HCL 8 MG PO TABS: 8.0000 mg | ORAL_TABLET | Freq: Two times a day (BID) | ORAL | Status: DC

## 2012-05-15 MED ORDER — PROCHLORPERAZINE 25 MG RE SUPP: 25.0000 mg | Freq: Two times a day (BID) | RECTAL | Status: DC | PRN

## 2012-05-15 MED ORDER — LORAZEPAM 0.5 MG PO TABS: 0.5000 mg | ORAL_TABLET | Freq: Four times a day (QID) | ORAL | Status: DC | PRN

## 2012-05-15 MED ORDER — DEXAMETHASONE 4 MG PO TABS: 8.0000 mg | ORAL_TABLET | Freq: Two times a day (BID) | ORAL | Status: DC

## 2012-05-15 NOTE — Telephone Encounter (Signed)
KK 3/7, LA 3/14, 3/21, 3/28, 4/4  Sent michelle email to set up patient treatment
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
## 2012-05-15 NOTE — Telephone Encounter (Signed)
KK 3/7, LA 3/14, 3/21, 3/28, 4/4  Sent michelle email to set up treatment

## 2012-05-15 NOTE — Telephone Encounter (Signed)
Per staff message and POF I have scheduled appts.  JMW  

## 2012-05-15 NOTE — Patient Instructions (Signed)
Doing well.  Proceed with treatment tomorrow.  We will see you next week for your next dose of chemo.

## 2012-05-16 ENCOUNTER — Other Ambulatory Visit: Payer: PRIVATE HEALTH INSURANCE

## 2012-05-16 ENCOUNTER — Ambulatory Visit (HOSPITAL_BASED_OUTPATIENT_CLINIC_OR_DEPARTMENT_OTHER): Payer: PRIVATE HEALTH INSURANCE

## 2012-05-16 ENCOUNTER — Ambulatory Visit: Payer: PRIVATE HEALTH INSURANCE | Admitting: Adult Health

## 2012-05-16 VITALS — BP 119/82 | HR 80 | Temp 97.7°F | Resp 20

## 2012-05-16 DIAGNOSIS — C773 Secondary and unspecified malignant neoplasm of axilla and upper limb lymph nodes: Secondary | ICD-10-CM

## 2012-05-16 DIAGNOSIS — Z5111 Encounter for antineoplastic chemotherapy: Secondary | ICD-10-CM

## 2012-05-16 DIAGNOSIS — C50519 Malignant neoplasm of lower-outer quadrant of unspecified female breast: Secondary | ICD-10-CM

## 2012-05-16 MED ORDER — FAMOTIDINE IN NACL 20-0.9 MG/50ML-% IV SOLN
20.0000 mg | Freq: Once | INTRAVENOUS | Status: AC
  Administered 2012-05-16: 20 mg via INTRAVENOUS

## 2012-05-16 MED ORDER — PACLITAXEL CHEMO INJECTION 300 MG/50ML
80.0000 mg/m2 | Freq: Once | INTRAVENOUS | Status: AC
  Administered 2012-05-16: 150 mg via INTRAVENOUS
  Filled 2012-05-16: qty 25

## 2012-05-16 MED ORDER — DEXAMETHASONE SODIUM PHOSPHATE 4 MG/ML IJ SOLN
20.0000 mg | Freq: Once | INTRAMUSCULAR | Status: AC
  Administered 2012-05-16: 20 mg via INTRAVENOUS

## 2012-05-16 MED ORDER — HEPARIN SOD (PORK) LOCK FLUSH 100 UNIT/ML IV SOLN
500.0000 [IU] | Freq: Once | INTRAVENOUS | Status: AC | PRN
  Administered 2012-05-16: 500 [IU]
  Filled 2012-05-16: qty 5

## 2012-05-16 MED ORDER — DIPHENHYDRAMINE HCL 50 MG/ML IJ SOLN
50.0000 mg | Freq: Once | INTRAMUSCULAR | Status: AC
  Administered 2012-05-16: 50 mg via INTRAVENOUS

## 2012-05-16 MED ORDER — ONDANSETRON 8 MG/50ML IVPB (CHCC)
8.0000 mg | Freq: Once | INTRAVENOUS | Status: AC
  Administered 2012-05-16: 8 mg via INTRAVENOUS

## 2012-05-16 MED ORDER — SODIUM CHLORIDE 0.9 % IV SOLN
Freq: Once | INTRAVENOUS | Status: AC
  Administered 2012-05-16: 13:00:00 via INTRAVENOUS

## 2012-05-16 MED ORDER — SODIUM CHLORIDE 0.9 % IJ SOLN
10.0000 mL | INTRAMUSCULAR | Status: DC | PRN
  Administered 2012-05-16: 10 mL
  Filled 2012-05-16: qty 10

## 2012-05-16 NOTE — Patient Instructions (Addendum)
[facility] Discharge Instructions for Patients Receiving Chemotherapy  Today you received the following chemotherapy agents taxol  To help prevent nausea and vomiting after your treatment, we encourage you to take your nausea medication  and take it as often as prescribed   If you develop nausea and vomiting that is not controlled by your nausea medication, call the clinic. If it is after clinic hours your family physician or the after hours number for the clinic or go to the Emergency Department.   BELOW ARE SYMPTOMS THAT SHOULD BE REPORTED IMMEDIATELY:  *FEVER GREATER THAN 100.5 F  *CHILLS WITH OR WITHOUT FEVER  NAUSEA AND VOMITING THAT IS NOT CONTROLLED WITH YOUR NAUSEA MEDICATION  *UNUSUAL SHORTNESS OF BREATH  *UNUSUAL BRUISING OR BLEEDING  TENDERNESS IN MOUTH AND THROAT WITH OR WITHOUT PRESENCE OF ULCERS  *URINARY PROBLEMS  *BOWEL PROBLEMS  UNUSUAL RASH Items with * indicate a potential emergency and should be followed up as soon as possible.  One of the nurses will contact you 24 hours after your treatment. Please let the nurse know about any problems that you may have experienced. Feel free to call the clinic you have any questions or concerns. The clinic phone number is [phone].   I have been informed and understand all the instructions given to me. I know to contact the clinic, my physician, or go to the Emergency Department if any problems should occur. I do not have any questions at this time, but understand that I may call the clinic during office hours   should I have any questions or need assistance in obtaining follow up care.    __________________________________________  _____________  __________ Signature of Patient or Authorized Representative            Date                   Time    __________________________________________ Nurse's Signature    

## 2012-05-20 ENCOUNTER — Other Ambulatory Visit: Payer: Self-pay | Admitting: *Deleted

## 2012-05-20 DIAGNOSIS — K30 Functional dyspepsia: Secondary | ICD-10-CM

## 2012-05-20 MED ORDER — OMEPRAZOLE 40 MG PO CPDR: 40.0000 mg | DELAYED_RELEASE_CAPSULE | Freq: Every day | ORAL | Status: DC

## 2012-05-23 ENCOUNTER — Ambulatory Visit (HOSPITAL_BASED_OUTPATIENT_CLINIC_OR_DEPARTMENT_OTHER): Payer: BC Managed Care – PPO | Admitting: Adult Health

## 2012-05-23 ENCOUNTER — Other Ambulatory Visit (HOSPITAL_BASED_OUTPATIENT_CLINIC_OR_DEPARTMENT_OTHER): Payer: BC Managed Care – PPO | Admitting: Lab

## 2012-05-23 ENCOUNTER — Encounter: Payer: Self-pay | Admitting: Adult Health

## 2012-05-23 ENCOUNTER — Ambulatory Visit (HOSPITAL_BASED_OUTPATIENT_CLINIC_OR_DEPARTMENT_OTHER): Payer: BC Managed Care – PPO

## 2012-05-23 VITALS — BP 134/90 | HR 80 | Temp 98.1°F | Resp 20 | Ht 66.0 in | Wt 163.7 lb

## 2012-05-23 DIAGNOSIS — C50519 Malignant neoplasm of lower-outer quadrant of unspecified female breast: Secondary | ICD-10-CM

## 2012-05-23 DIAGNOSIS — C773 Secondary and unspecified malignant neoplasm of axilla and upper limb lymph nodes: Secondary | ICD-10-CM

## 2012-05-23 DIAGNOSIS — Z170 Estrogen receptor positive status [ER+]: Secondary | ICD-10-CM

## 2012-05-23 DIAGNOSIS — Z5111 Encounter for antineoplastic chemotherapy: Secondary | ICD-10-CM

## 2012-05-23 LAB — COMPREHENSIVE METABOLIC PANEL (CC13)
ALT: 34 U/L (ref 0–55)
AST: 25 U/L (ref 5–34)
Albumin: 3.3 g/dL — ABNORMAL LOW (ref 3.5–5.0)
CO2: 25 mEq/L (ref 22–29)
Calcium: 8.9 mg/dL (ref 8.4–10.4)
Chloride: 109 mEq/L — ABNORMAL HIGH (ref 98–107)
Creatinine: 0.6 mg/dL (ref 0.6–1.1)
Potassium: 3.8 mEq/L (ref 3.5–5.1)
Sodium: 141 mEq/L (ref 136–145)
Total Protein: 6.6 g/dL (ref 6.4–8.3)

## 2012-05-23 LAB — CBC WITH DIFFERENTIAL/PLATELET
BASO%: 0.7 % (ref 0.0–2.0)
Eosinophils Absolute: 0.1 10*3/uL (ref 0.0–0.5)
LYMPH%: 24.8 % (ref 14.0–49.7)
MCHC: 31.6 g/dL (ref 31.5–36.0)
MONO#: 0.4 10*3/uL (ref 0.1–0.9)
NEUT#: 2.7 10*3/uL (ref 1.5–6.5)
Platelets: 313 10*3/uL (ref 145–400)
RBC: 4.29 10*6/uL (ref 3.70–5.45)
WBC: 4.2 10*3/uL (ref 3.9–10.3)
lymph#: 1 10*3/uL (ref 0.9–3.3)
nRBC: 0 % (ref 0–0)

## 2012-05-23 MED ORDER — DEXAMETHASONE SODIUM PHOSPHATE 4 MG/ML IJ SOLN
20.0000 mg | Freq: Once | INTRAMUSCULAR | Status: AC
  Administered 2012-05-23: 20 mg via INTRAVENOUS

## 2012-05-23 MED ORDER — FAMOTIDINE IN NACL 20-0.9 MG/50ML-% IV SOLN
20.0000 mg | Freq: Once | INTRAVENOUS | Status: AC
  Administered 2012-05-23: 20 mg via INTRAVENOUS

## 2012-05-23 MED ORDER — DIPHENHYDRAMINE HCL 50 MG/ML IJ SOLN
50.0000 mg | Freq: Once | INTRAMUSCULAR | Status: AC
  Administered 2012-05-23: 50 mg via INTRAVENOUS

## 2012-05-23 MED ORDER — SODIUM CHLORIDE 0.9 % IJ SOLN
10.0000 mL | INTRAMUSCULAR | Status: DC | PRN
  Administered 2012-05-23: 10 mL
  Filled 2012-05-23: qty 10

## 2012-05-23 MED ORDER — SODIUM CHLORIDE 0.9 % IV SOLN
Freq: Once | INTRAVENOUS | Status: AC
  Administered 2012-05-23: 14:00:00 via INTRAVENOUS

## 2012-05-23 MED ORDER — ONDANSETRON 8 MG/50ML IVPB (CHCC)
8.0000 mg | Freq: Once | INTRAVENOUS | Status: AC
  Administered 2012-05-23: 8 mg via INTRAVENOUS

## 2012-05-23 MED ORDER — HEPARIN SOD (PORK) LOCK FLUSH 100 UNIT/ML IV SOLN
500.0000 [IU] | Freq: Once | INTRAVENOUS | Status: AC | PRN
  Administered 2012-05-23: 500 [IU]
  Filled 2012-05-23: qty 5

## 2012-05-23 MED ORDER — PACLITAXEL CHEMO INJECTION 300 MG/50ML
80.0000 mg/m2 | Freq: Once | INTRAVENOUS | Status: AC
  Administered 2012-05-23: 150 mg via INTRAVENOUS
  Filled 2012-05-23: qty 25

## 2012-05-23 NOTE — Patient Instructions (Signed)
Doing well.  Proceed with chemotherapy.  Please call us if you have any questions or concerns.    

## 2012-05-23 NOTE — Patient Instructions (Signed)
[facility] Discharge Instructions for Patients Receiving Chemotherapy  Today you received the following chemotherapy agents Taxol  To help prevent nausea and vomiting after your treatment, we encourage you to take your nausea medication as directed.   If you develop nausea and vomiting that is not controlled by your nausea medication, call the clinic. If it is after clinic hours your family physician or the after hours number for the clinic or go to the Emergency Department.   BELOW ARE SYMPTOMS THAT SHOULD BE REPORTED IMMEDIATELY:  *FEVER GREATER THAN 100.5 F  *CHILLS WITH OR WITHOUT FEVER  NAUSEA AND VOMITING THAT IS NOT CONTROLLED WITH YOUR NAUSEA MEDICATION  *UNUSUAL SHORTNESS OF BREATH  *UNUSUAL BRUISING OR BLEEDING  TENDERNESS IN MOUTH AND THROAT WITH OR WITHOUT PRESENCE OF ULCERS  *URINARY PROBLEMS  *BOWEL PROBLEMS  UNUSUAL RASH Items with * indicate a potential emergency and should be followed up as soon as possible.  One of the nurses will contact you 24 hours after your treatment. Please let the nurse know about any problems that you may have experienced. Feel free to call the clinic you have any questions or concerns. The clinic phone number is [phone].   I have been informed and understand all the instructions given to me. I know to contact the clinic, my physician, or go to the Emergency Department if any problems should occur. I do not have any questions at this time, but understand that I may call the clinic during office hours   should I have any questions or need assistance in obtaining follow up care.    __________________________________________  _____________  __________ Signature of Patient or Authorized Representative            Date                   Time    __________________________________________ Nurse's Signature    
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
## 2012-05-26 ENCOUNTER — Encounter: Payer: Self-pay | Admitting: Oncology

## 2012-05-26 NOTE — Progress Notes (Signed)
Processed patient's application. 72family $24,000. I will send letter/green card to patient. 65% indigent 05/26/12-11/23/12. All documents have been scanned in the system.

## 2012-05-30 ENCOUNTER — Encounter: Payer: Self-pay | Admitting: Adult Health

## 2012-05-30 ENCOUNTER — Other Ambulatory Visit (HOSPITAL_BASED_OUTPATIENT_CLINIC_OR_DEPARTMENT_OTHER): Payer: BC Managed Care – PPO | Admitting: Lab

## 2012-05-30 ENCOUNTER — Ambulatory Visit (HOSPITAL_BASED_OUTPATIENT_CLINIC_OR_DEPARTMENT_OTHER): Payer: BC Managed Care – PPO

## 2012-05-30 ENCOUNTER — Ambulatory Visit (HOSPITAL_BASED_OUTPATIENT_CLINIC_OR_DEPARTMENT_OTHER): Payer: BC Managed Care – PPO | Admitting: Adult Health

## 2012-05-30 VITALS — BP 117/75 | HR 76 | Temp 97.8°F | Resp 20 | Ht 66.0 in | Wt 164.3 lb

## 2012-05-30 DIAGNOSIS — C773 Secondary and unspecified malignant neoplasm of axilla and upper limb lymph nodes: Secondary | ICD-10-CM

## 2012-05-30 DIAGNOSIS — C50519 Malignant neoplasm of lower-outer quadrant of unspecified female breast: Secondary | ICD-10-CM

## 2012-05-30 DIAGNOSIS — Z5111 Encounter for antineoplastic chemotherapy: Secondary | ICD-10-CM

## 2012-05-30 DIAGNOSIS — Z170 Estrogen receptor positive status [ER+]: Secondary | ICD-10-CM

## 2012-05-30 DIAGNOSIS — Z9010 Acquired absence of unspecified breast and nipple: Secondary | ICD-10-CM

## 2012-05-30 LAB — CBC WITH DIFFERENTIAL/PLATELET
Basophils Absolute: 0 10*3/uL (ref 0.0–0.1)
EOS%: 1.4 % (ref 0.0–7.0)
HGB: 11.2 g/dL — ABNORMAL LOW (ref 11.6–15.9)
MCH: 25.3 pg (ref 25.1–34.0)
MCV: 78.8 fL — ABNORMAL LOW (ref 79.5–101.0)
MONO%: 7.5 % (ref 0.0–14.0)
NEUT#: 2.7 10*3/uL (ref 1.5–6.5)
RBC: 4.43 10*6/uL (ref 3.70–5.45)
RDW: 17.5 % — ABNORMAL HIGH (ref 11.2–14.5)
lymph#: 1.1 10*3/uL (ref 0.9–3.3)
nRBC: 1 % — ABNORMAL HIGH (ref 0–0)

## 2012-05-30 LAB — COMPREHENSIVE METABOLIC PANEL (CC13)
ALT: 26 U/L (ref 0–55)
AST: 20 U/L (ref 5–34)
Albumin: 3.3 g/dL — ABNORMAL LOW (ref 3.5–5.0)
Alkaline Phosphatase: 72 U/L (ref 40–150)
Glucose: 87 mg/dl (ref 70–99)
Potassium: 3.6 mEq/L (ref 3.5–5.1)
Sodium: 140 mEq/L (ref 136–145)
Total Bilirubin: 0.25 mg/dL (ref 0.20–1.20)
Total Protein: 6.6 g/dL (ref 6.4–8.3)

## 2012-05-30 MED ORDER — PACLITAXEL CHEMO INJECTION 300 MG/50ML
80.0000 mg/m2 | Freq: Once | INTRAVENOUS | Status: AC
  Administered 2012-05-30: 150 mg via INTRAVENOUS
  Filled 2012-05-30: qty 25

## 2012-05-30 MED ORDER — DEXAMETHASONE SODIUM PHOSPHATE 4 MG/ML IJ SOLN
20.0000 mg | Freq: Once | INTRAMUSCULAR | Status: AC
  Administered 2012-05-30: 20 mg via INTRAVENOUS

## 2012-05-30 MED ORDER — SODIUM CHLORIDE 0.9 % IV SOLN
Freq: Once | INTRAVENOUS | Status: AC
  Administered 2012-05-30: 14:00:00 via INTRAVENOUS

## 2012-05-30 MED ORDER — HEPARIN SOD (PORK) LOCK FLUSH 100 UNIT/ML IV SOLN
500.0000 [IU] | Freq: Once | INTRAVENOUS | Status: AC | PRN
  Administered 2012-05-30: 500 [IU]
  Filled 2012-05-30: qty 5

## 2012-05-30 MED ORDER — FAMOTIDINE IN NACL 20-0.9 MG/50ML-% IV SOLN
20.0000 mg | Freq: Once | INTRAVENOUS | Status: AC
  Administered 2012-05-30: 20 mg via INTRAVENOUS

## 2012-05-30 MED ORDER — DIPHENHYDRAMINE HCL 50 MG/ML IJ SOLN
50.0000 mg | Freq: Once | INTRAMUSCULAR | Status: AC
  Administered 2012-05-30: 50 mg via INTRAVENOUS

## 2012-05-30 MED ORDER — ONDANSETRON 8 MG/50ML IVPB (CHCC)
8.0000 mg | Freq: Once | INTRAVENOUS | Status: AC
  Administered 2012-05-30: 8 mg via INTRAVENOUS

## 2012-05-30 MED ORDER — SODIUM CHLORIDE 0.9 % IJ SOLN
10.0000 mL | INTRAMUSCULAR | Status: DC | PRN
  Administered 2012-05-30: 10 mL
  Filled 2012-05-30: qty 10

## 2012-05-30 NOTE — Patient Instructions (Addendum)
Doing well.  Proceed with chemotherapy.  Please call us if you have any questions or concerns.    

## 2012-05-30 NOTE — Patient Instructions (Addendum)
[facility] Discharge Instructions for Patients Receiving Chemotherapy  Today you received the following chemotherapy agents Taxol.  To help prevent nausea and vomiting after your treatment, we encourage you to take your nausea medication as prescribed.   If you develop nausea and vomiting that is not controlled by your nausea medication, call the clinic. If it is after clinic hours your family physician or the after hours number for the clinic or go to the Emergency Department.   BELOW ARE SYMPTOMS THAT SHOULD BE REPORTED IMMEDIATELY:  *FEVER GREATER THAN 100.5 F  *CHILLS WITH OR WITHOUT FEVER  NAUSEA AND VOMITING THAT IS NOT CONTROLLED WITH YOUR NAUSEA MEDICATION  *UNUSUAL SHORTNESS OF BREATH  *UNUSUAL BRUISING OR BLEEDING  TENDERNESS IN MOUTH AND THROAT WITH OR WITHOUT PRESENCE OF ULCERS  *URINARY PROBLEMS  *BOWEL PROBLEMS  UNUSUAL RASH Items with * indicate a potential emergency and should be followed up as soon as possible.  Feel free to call the clinic you have any questions or concerns. The clinic phone number is [phone].   I have been informed and understand all the instructions given to me. I know to contact the clinic, my physician, or go to the Emergency Department if any problems should occur. I do not have any questions at this time, but understand that I may call the clinic during office hours   should I have any questions or need assistance in obtaining follow up care.    __________________________________________  _____________  __________ Signature of Patient or Authorized Representative            Date                   Time    __________________________________________ Nurse's Signature    
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
## 2012-06-06 ENCOUNTER — Ambulatory Visit (HOSPITAL_BASED_OUTPATIENT_CLINIC_OR_DEPARTMENT_OTHER): Payer: BC Managed Care – PPO

## 2012-06-06 ENCOUNTER — Other Ambulatory Visit (HOSPITAL_BASED_OUTPATIENT_CLINIC_OR_DEPARTMENT_OTHER): Payer: BC Managed Care – PPO | Admitting: Lab

## 2012-06-06 ENCOUNTER — Encounter: Payer: Self-pay | Admitting: Adult Health

## 2012-06-06 ENCOUNTER — Ambulatory Visit (HOSPITAL_BASED_OUTPATIENT_CLINIC_OR_DEPARTMENT_OTHER): Payer: BC Managed Care – PPO | Admitting: Adult Health

## 2012-06-06 VITALS — BP 129/84 | HR 76 | Temp 97.6°F | Resp 20 | Ht 66.0 in | Wt 163.6 lb

## 2012-06-06 DIAGNOSIS — C50519 Malignant neoplasm of lower-outer quadrant of unspecified female breast: Secondary | ICD-10-CM

## 2012-06-06 DIAGNOSIS — Z5111 Encounter for antineoplastic chemotherapy: Secondary | ICD-10-CM

## 2012-06-06 DIAGNOSIS — Z170 Estrogen receptor positive status [ER+]: Secondary | ICD-10-CM

## 2012-06-06 LAB — COMPREHENSIVE METABOLIC PANEL (CC13)
ALT: 34 U/L (ref 0–55)
Alkaline Phosphatase: 81 U/L (ref 40–150)
CO2: 23 mEq/L (ref 22–29)
Sodium: 140 mEq/L (ref 136–145)
Total Bilirubin: 0.24 mg/dL (ref 0.20–1.20)
Total Protein: 6.6 g/dL (ref 6.4–8.3)

## 2012-06-06 LAB — CBC WITH DIFFERENTIAL/PLATELET
BASO%: 1.2 % (ref 0.0–2.0)
LYMPH%: 29.4 % (ref 14.0–49.7)
MCHC: 32.3 g/dL (ref 31.5–36.0)
MONO#: 0.3 10*3/uL (ref 0.1–0.9)
Platelets: 183 10*3/uL (ref 145–400)
RBC: 4.44 10*6/uL (ref 3.70–5.45)
RDW: 19.3 % — ABNORMAL HIGH (ref 11.2–14.5)
WBC: 3.3 10*3/uL — ABNORMAL LOW (ref 3.9–10.3)

## 2012-06-06 MED ORDER — FAMOTIDINE IN NACL 20-0.9 MG/50ML-% IV SOLN
20.0000 mg | Freq: Once | INTRAVENOUS | Status: AC
  Administered 2012-06-06: 20 mg via INTRAVENOUS

## 2012-06-06 MED ORDER — DEXAMETHASONE SODIUM PHOSPHATE 4 MG/ML IJ SOLN
20.0000 mg | Freq: Once | INTRAMUSCULAR | Status: AC
  Administered 2012-06-06: 20 mg via INTRAVENOUS

## 2012-06-06 MED ORDER — DIPHENHYDRAMINE HCL 50 MG/ML IJ SOLN
50.0000 mg | Freq: Once | INTRAMUSCULAR | Status: AC
  Administered 2012-06-06: 50 mg via INTRAVENOUS

## 2012-06-06 MED ORDER — SODIUM CHLORIDE 0.9 % IJ SOLN
10.0000 mL | INTRAMUSCULAR | Status: DC | PRN
  Administered 2012-06-06: 10 mL
  Filled 2012-06-06: qty 10

## 2012-06-06 MED ORDER — HEPARIN SOD (PORK) LOCK FLUSH 100 UNIT/ML IV SOLN
500.0000 [IU] | Freq: Once | INTRAVENOUS | Status: AC | PRN
  Administered 2012-06-06: 500 [IU]
  Filled 2012-06-06: qty 5

## 2012-06-06 MED ORDER — SODIUM CHLORIDE 0.9 % IV SOLN
Freq: Once | INTRAVENOUS | Status: AC
  Administered 2012-06-06: 20 mL via INTRAVENOUS

## 2012-06-06 MED ORDER — ONDANSETRON 8 MG/50ML IVPB (CHCC)
8.0000 mg | Freq: Once | INTRAVENOUS | Status: AC
  Administered 2012-06-06: 8 mg via INTRAVENOUS

## 2012-06-06 MED ORDER — PACLITAXEL CHEMO INJECTION 300 MG/50ML
80.0000 mg/m2 | Freq: Once | INTRAVENOUS | Status: AC
  Administered 2012-06-06: 150 mg via INTRAVENOUS
  Filled 2012-06-06: qty 25
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
## 2012-06-06 NOTE — Patient Instructions (Addendum)
[facility] Discharge Instructions for Patients Receiving Chemotherapy  Today you received the following chemotherapy agents Taxol.  To help prevent nausea and vomiting after your treatment, we encourage you to take your nausea medication as prescribed.   If you develop nausea and vomiting that is not controlled by your nausea medication, call the clinic. If it is after clinic hours your family physician or the after hours number for the clinic or go to the Emergency Department.   BELOW ARE SYMPTOMS THAT SHOULD BE REPORTED IMMEDIATELY:  *FEVER GREATER THAN 100.5 F  *CHILLS WITH OR WITHOUT FEVER  NAUSEA AND VOMITING THAT IS NOT CONTROLLED WITH YOUR NAUSEA MEDICATION  *UNUSUAL SHORTNESS OF BREATH  *UNUSUAL BRUISING OR BLEEDING  TENDERNESS IN MOUTH AND THROAT WITH OR WITHOUT PRESENCE OF ULCERS  *URINARY PROBLEMS  *BOWEL PROBLEMS  UNUSUAL RASH Items with * indicate a potential emergency and should be followed up as soon as possible.  Feel free to call the clinic you have any questions or concerns. The clinic phone number is [phone].   I have been informed and understand all the instructions given to me. I know to contact the clinic, my physician, or go to the Emergency Department if any problems should occur. I do not have any questions at this time, but understand that I may call the clinic during office hours   should I have any questions or need assistance in obtaining follow up care.    __________________________________________  _____________  __________ Signature of Patient or Authorized Representative            Date                   Time    __________________________________________ Nurse's Signature    

## 2012-06-06 NOTE — Patient Instructions (Addendum)
Doing well.  Proceed with chemotherapy.  Please call if your numbness gets worse.  We will see you back next week for your next treatment.

## 2012-06-13 ENCOUNTER — Ambulatory Visit (HOSPITAL_BASED_OUTPATIENT_CLINIC_OR_DEPARTMENT_OTHER): Payer: BC Managed Care – PPO | Admitting: Adult Health

## 2012-06-13 ENCOUNTER — Other Ambulatory Visit (HOSPITAL_BASED_OUTPATIENT_CLINIC_OR_DEPARTMENT_OTHER): Payer: BC Managed Care – PPO

## 2012-06-13 ENCOUNTER — Ambulatory Visit (HOSPITAL_BASED_OUTPATIENT_CLINIC_OR_DEPARTMENT_OTHER): Payer: BC Managed Care – PPO

## 2012-06-13 ENCOUNTER — Other Ambulatory Visit: Payer: Self-pay | Admitting: Emergency Medicine

## 2012-06-13 ENCOUNTER — Encounter: Payer: Self-pay | Admitting: Adult Health

## 2012-06-13 VITALS — BP 136/85 | HR 86 | Temp 97.9°F | Resp 18 | Ht 66.0 in | Wt 164.2 lb

## 2012-06-13 DIAGNOSIS — Z5111 Encounter for antineoplastic chemotherapy: Secondary | ICD-10-CM

## 2012-06-13 DIAGNOSIS — G589 Mononeuropathy, unspecified: Secondary | ICD-10-CM

## 2012-06-13 DIAGNOSIS — C50519 Malignant neoplasm of lower-outer quadrant of unspecified female breast: Secondary | ICD-10-CM

## 2012-06-13 DIAGNOSIS — Z170 Estrogen receptor positive status [ER+]: Secondary | ICD-10-CM

## 2012-06-13 LAB — CBC WITH DIFFERENTIAL/PLATELET
Basophils Absolute: 0 10*3/uL (ref 0.0–0.1)
Eosinophils Absolute: 0 10*3/uL (ref 0.0–0.5)
HCT: 34.1 % — ABNORMAL LOW (ref 34.8–46.6)
HGB: 11 g/dL — ABNORMAL LOW (ref 11.6–15.9)
LYMPH%: 31.5 % (ref 14.0–49.7)
MCH: 25.3 pg (ref 25.1–34.0)
MCV: 78.6 fL — ABNORMAL LOW (ref 79.5–101.0)
MONO%: 8.1 % (ref 0.0–14.0)
NEUT#: 1.3 10*3/uL — ABNORMAL LOW (ref 1.5–6.5)
NEUT%: 58.1 % (ref 38.4–76.8)
Platelets: 199 10*3/uL (ref 145–400)

## 2012-06-13 LAB — COMPREHENSIVE METABOLIC PANEL (CC13)
ALT: 65 U/L — ABNORMAL HIGH (ref 0–55)
AST: 46 U/L — ABNORMAL HIGH (ref 5–34)
CO2: 24 mEq/L (ref 22–29)
Calcium: 9.1 mg/dL (ref 8.4–10.4)
Chloride: 109 mEq/L — ABNORMAL HIGH (ref 98–107)
Sodium: 140 mEq/L (ref 136–145)
Total Protein: 6.8 g/dL (ref 6.4–8.3)

## 2012-06-13 MED ORDER — PACLITAXEL CHEMO INJECTION 300 MG/50ML
80.0000 mg/m2 | Freq: Once | INTRAVENOUS | Status: AC
  Administered 2012-06-13: 150 mg via INTRAVENOUS
  Filled 2012-06-13: qty 25

## 2012-06-13 MED ORDER — UNABLE TO FIND: Status: DC

## 2012-06-13 MED ORDER — ONDANSETRON 8 MG/50ML IVPB (CHCC)
8.0000 mg | Freq: Once | INTRAVENOUS | Status: AC
  Administered 2012-06-13: 8 mg via INTRAVENOUS

## 2012-06-13 MED ORDER — FAMOTIDINE IN NACL 20-0.9 MG/50ML-% IV SOLN
20.0000 mg | Freq: Once | INTRAVENOUS | Status: AC
  Administered 2012-06-13: 20 mg via INTRAVENOUS

## 2012-06-13 MED ORDER — SODIUM CHLORIDE 0.9 % IJ SOLN
10.0000 mL | INTRAMUSCULAR | Status: DC | PRN
  Administered 2012-06-13: 10 mL
  Filled 2012-06-13: qty 10

## 2012-06-13 MED ORDER — HEPARIN SOD (PORK) LOCK FLUSH 100 UNIT/ML IV SOLN
500.0000 [IU] | Freq: Once | INTRAVENOUS | Status: AC | PRN
  Administered 2012-06-13: 500 [IU]
  Filled 2012-06-13: qty 5

## 2012-06-13 MED ORDER — DIPHENHYDRAMINE HCL 50 MG/ML IJ SOLN
50.0000 mg | Freq: Once | INTRAMUSCULAR | Status: AC
  Administered 2012-06-13: 50 mg via INTRAVENOUS

## 2012-06-13 MED ORDER — SODIUM CHLORIDE 0.9 % IV SOLN
Freq: Once | INTRAVENOUS | Status: AC
  Administered 2012-06-13: 14:00:00 via INTRAVENOUS

## 2012-06-13 MED ORDER — DEXAMETHASONE SODIUM PHOSPHATE 4 MG/ML IJ SOLN
20.0000 mg | Freq: Once | INTRAMUSCULAR | Status: AC
  Administered 2012-06-13: 20 mg via INTRAVENOUS
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
## 2012-06-13 NOTE — Patient Instructions (Signed)
 Cancer Center Discharge Instructions for Patients Receiving Chemotherapy  Today you received the following chemotherapy agents: Taxol To help prevent nausea and vomiting after your treatment, we encourage you to take your nausea medication   If you develop nausea and vomiting that is not controlled by your nausea medication, call the clinic. If it is after clinic hours your family physician or the after hours number for the clinic or go to the Emergency Department.   BELOW ARE SYMPTOMS THAT SHOULD BE REPORTED IMMEDIATELY:  *FEVER GREATER THAN 100.5 F  *CHILLS WITH OR WITHOUT FEVER  NAUSEA AND VOMITING THAT IS NOT CONTROLLED WITH YOUR NAUSEA MEDICATION  *UNUSUAL SHORTNESS OF BREATH  *UNUSUAL BRUISING OR BLEEDING  TENDERNESS IN MOUTH AND THROAT WITH OR WITHOUT PRESENCE OF ULCERS  *URINARY PROBLEMS  *BOWEL PROBLEMS  UNUSUAL RASH Items with * indicate a potential emergency and should be followed up as soon as possible. Please let the nurse know about any problems that you may have experienced. Feel free to call the clinic you have any questions or concerns. The clinic phone number is [phone].   I have been informed and understand all the instructions given to me. I know to contact the clinic, my physician, or go to the Emergency Department if any problems should occur. I do not have any questions at this time, but understand that I may call the clinic during office hours   should I have any questions or need assistance in obtaining follow up care.    __________________________________________  _____________  __________ Signature of Patient or Authorized Representative            Date                   Time    __________________________________________ Nurse's Signature

## 2012-06-13 NOTE — Patient Instructions (Addendum)
Doing well, proceed with chemotherapy.  Please call us if you have any questions or concerns.    

## 2012-06-16 ENCOUNTER — Other Ambulatory Visit: Payer: Self-pay | Admitting: Medical Oncology

## 2012-06-16 MED ORDER — GABAPENTIN 300 MG PO CAPS: 300.0000 mg | ORAL_CAPSULE | Freq: Three times a day (TID) | ORAL | Status: DC

## 2012-06-16 NOTE — Telephone Encounter (Signed)
Pt LVMOM requesting refill of Neurotin. Per MD, refill escribed to patient's pharmacy. Patient informed. No further questions at this time.

## 2012-06-20 ENCOUNTER — Ambulatory Visit: Payer: PRIVATE HEALTH INSURANCE

## 2012-06-20 ENCOUNTER — Ambulatory Visit (HOSPITAL_BASED_OUTPATIENT_CLINIC_OR_DEPARTMENT_OTHER): Payer: BC Managed Care – PPO | Admitting: Adult Health

## 2012-06-20 ENCOUNTER — Encounter: Payer: Self-pay | Admitting: Adult Health

## 2012-06-20 ENCOUNTER — Other Ambulatory Visit (HOSPITAL_BASED_OUTPATIENT_CLINIC_OR_DEPARTMENT_OTHER): Payer: BC Managed Care – PPO | Admitting: Lab

## 2012-06-20 VITALS — BP 136/88 | HR 84 | Temp 97.7°F | Resp 20 | Ht 66.0 in | Wt 162.0 lb

## 2012-06-20 DIAGNOSIS — G589 Mononeuropathy, unspecified: Secondary | ICD-10-CM

## 2012-06-20 DIAGNOSIS — C50519 Malignant neoplasm of lower-outer quadrant of unspecified female breast: Secondary | ICD-10-CM

## 2012-06-20 DIAGNOSIS — Z170 Estrogen receptor positive status [ER+]: Secondary | ICD-10-CM

## 2012-06-20 DIAGNOSIS — C773 Secondary and unspecified malignant neoplasm of axilla and upper limb lymph nodes: Secondary | ICD-10-CM

## 2012-06-20 LAB — CBC WITH DIFFERENTIAL/PLATELET
Basophils Absolute: 0 10*3/uL (ref 0.0–0.1)
Eosinophils Absolute: 0 10*3/uL (ref 0.0–0.5)
HCT: 36.1 % (ref 34.8–46.6)
HGB: 11.5 g/dL — ABNORMAL LOW (ref 11.6–15.9)
MONO#: 0.2 10*3/uL (ref 0.1–0.9)
NEUT#: 1.2 10*3/uL — ABNORMAL LOW (ref 1.5–6.5)
NEUT%: 52.6 % (ref 38.4–76.8)
WBC: 2.3 10*3/uL — ABNORMAL LOW (ref 3.9–10.3)
lymph#: 0.9 10*3/uL (ref 0.9–3.3)
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
## 2012-06-20 NOTE — Patient Instructions (Addendum)
We will hold your chemo today due to the neuropathy.  Please continue your Neurontin and B complex and we will see you next week.  Please call us if you have any questions or concerns.

## 2012-06-27 ENCOUNTER — Ambulatory Visit (HOSPITAL_BASED_OUTPATIENT_CLINIC_OR_DEPARTMENT_OTHER): Payer: BC Managed Care – PPO

## 2012-06-27 ENCOUNTER — Other Ambulatory Visit (HOSPITAL_BASED_OUTPATIENT_CLINIC_OR_DEPARTMENT_OTHER): Payer: BC Managed Care – PPO | Admitting: Lab

## 2012-06-27 ENCOUNTER — Ambulatory Visit (HOSPITAL_BASED_OUTPATIENT_CLINIC_OR_DEPARTMENT_OTHER): Payer: BC Managed Care – PPO | Admitting: Adult Health

## 2012-06-27 ENCOUNTER — Encounter: Payer: Self-pay | Admitting: Adult Health

## 2012-06-27 VITALS — BP 125/87 | HR 89 | Temp 98.6°F | Resp 20 | Ht 66.0 in | Wt 162.2 lb

## 2012-06-27 DIAGNOSIS — C50519 Malignant neoplasm of lower-outer quadrant of unspecified female breast: Secondary | ICD-10-CM

## 2012-06-27 DIAGNOSIS — Z5111 Encounter for antineoplastic chemotherapy: Secondary | ICD-10-CM

## 2012-06-27 DIAGNOSIS — C773 Secondary and unspecified malignant neoplasm of axilla and upper limb lymph nodes: Secondary | ICD-10-CM

## 2012-06-27 DIAGNOSIS — C50511 Malignant neoplasm of lower-outer quadrant of right female breast: Secondary | ICD-10-CM

## 2012-06-27 DIAGNOSIS — G589 Mononeuropathy, unspecified: Secondary | ICD-10-CM

## 2012-06-27 LAB — CBC WITH DIFFERENTIAL/PLATELET
Basophils Absolute: 0 10*3/uL (ref 0.0–0.1)
Eosinophils Absolute: 0 10*3/uL (ref 0.0–0.5)
HGB: 10.9 g/dL — ABNORMAL LOW (ref 11.6–15.9)
LYMPH%: 28.3 % (ref 14.0–49.7)
MCV: 78.4 fL — ABNORMAL LOW (ref 79.5–101.0)
MONO#: 0.4 10*3/uL (ref 0.1–0.9)
MONO%: 18.3 % — ABNORMAL HIGH (ref 0.0–14.0)
NEUT#: 1.2 10*3/uL — ABNORMAL LOW (ref 1.5–6.5)
Platelets: 251 10*3/uL (ref <2.0)
RDW: 16.7 % — ABNORMAL HIGH (ref 11.2–14.5)
WBC: 2.4 10*3/uL — ABNORMAL LOW (ref 3.9–10.3)

## 2012-06-27 LAB — COMPREHENSIVE METABOLIC PANEL (CC13)
CO2: 25 mEq/L (ref 22–29)
Calcium: 9.1 mg/dL (ref 8.4–10.4)
Chloride: 108 mEq/L — ABNORMAL HIGH (ref 98–107)
Glucose: 85 mg/dl (ref 70–99)
Sodium: 142 mEq/L (ref 136–145)
Total Bilirubin: 0.31 mg/dL (ref 0.20–1.20)
Total Protein: 7 g/dL (ref 6.4–8.3)

## 2012-06-27 MED ORDER — HEPARIN SOD (PORK) LOCK FLUSH 100 UNIT/ML IV SOLN
500.0000 [IU] | Freq: Once | INTRAVENOUS | Status: AC | PRN
  Administered 2012-06-27: 500 [IU]
  Filled 2012-06-27: qty 5

## 2012-06-27 MED ORDER — ONDANSETRON 8 MG/50ML IVPB (CHCC)
8.0000 mg | Freq: Once | INTRAVENOUS | Status: AC
  Administered 2012-06-27: 8 mg via INTRAVENOUS

## 2012-06-27 MED ORDER — SODIUM CHLORIDE 0.9 % IJ SOLN
10.0000 mL | INTRAMUSCULAR | Status: DC | PRN
  Administered 2012-06-27: 10 mL
  Filled 2012-06-27: qty 10

## 2012-06-27 MED ORDER — PACLITAXEL PROTEIN-BOUND CHEMO INJECTION 100 MG
100.0000 mg/m2 | Freq: Once | INTRAVENOUS | Status: AC
  Administered 2012-06-27: 175 mg via INTRAVENOUS
  Filled 2012-06-27: qty 35

## 2012-06-27 MED ORDER — SODIUM CHLORIDE 0.9 % IV SOLN
Freq: Once | INTRAVENOUS | Status: AC
  Administered 2012-06-27: 14:00:00 via INTRAVENOUS

## 2012-06-27 MED ORDER — DEXAMETHASONE SODIUM PHOSPHATE 10 MG/ML IJ SOLN
10.0000 mg | Freq: Once | INTRAMUSCULAR | Status: AC
  Administered 2012-06-27: 10 mg via INTRAVENOUS

## 2012-06-27 NOTE — Patient Instructions (Addendum)
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
## 2012-07-04 ENCOUNTER — Encounter: Payer: Self-pay | Admitting: Oncology

## 2012-07-04 ENCOUNTER — Ambulatory Visit (HOSPITAL_BASED_OUTPATIENT_CLINIC_OR_DEPARTMENT_OTHER): Payer: BC Managed Care – PPO

## 2012-07-04 ENCOUNTER — Other Ambulatory Visit (HOSPITAL_BASED_OUTPATIENT_CLINIC_OR_DEPARTMENT_OTHER): Payer: BC Managed Care – PPO | Admitting: Lab

## 2012-07-04 ENCOUNTER — Ambulatory Visit (HOSPITAL_BASED_OUTPATIENT_CLINIC_OR_DEPARTMENT_OTHER): Payer: PRIVATE HEALTH INSURANCE | Admitting: Oncology

## 2012-07-04 VITALS — BP 126/80 | HR 96 | Temp 98.0°F | Wt 165.2 lb

## 2012-07-04 DIAGNOSIS — C50511 Malignant neoplasm of lower-outer quadrant of right female breast: Secondary | ICD-10-CM

## 2012-07-04 DIAGNOSIS — C50519 Malignant neoplasm of lower-outer quadrant of unspecified female breast: Secondary | ICD-10-CM

## 2012-07-04 DIAGNOSIS — C773 Secondary and unspecified malignant neoplasm of axilla and upper limb lymph nodes: Secondary | ICD-10-CM

## 2012-07-04 DIAGNOSIS — Z5189 Encounter for other specified aftercare: Secondary | ICD-10-CM

## 2012-07-04 DIAGNOSIS — Z170 Estrogen receptor positive status [ER+]: Secondary | ICD-10-CM

## 2012-07-04 DIAGNOSIS — G589 Mononeuropathy, unspecified: Secondary | ICD-10-CM

## 2012-07-04 LAB — CBC WITH DIFFERENTIAL/PLATELET
BASO%: 3.6 % — ABNORMAL HIGH (ref 0.0–2.0)
EOS%: 1.8 % (ref 0.0–7.0)
HCT: 34.1 % — ABNORMAL LOW (ref 34.8–46.6)
LYMPH%: 44.2 % (ref 14.0–49.7)
MCH: 24.4 pg — ABNORMAL LOW (ref 25.1–34.0)
MCHC: 31.4 g/dL — ABNORMAL LOW (ref 31.5–36.0)
MONO#: 0.1 10*3/uL (ref 0.1–0.9)
NEUT%: 44.3 % (ref 38.4–76.8)
Platelets: 235 10*3/uL (ref 145–400)
RBC: 4.38 10*6/uL (ref 3.70–5.45)
WBC: 1.7 10*3/uL — ABNORMAL LOW (ref 3.9–10.3)
lymph#: 0.7 10*3/uL — ABNORMAL LOW (ref 0.9–3.3)
nRBC: 0 % (ref 0–0)

## 2012-07-04 MED ORDER — FILGRASTIM 480 MCG/0.8ML IJ SOLN
480.0000 ug | Freq: Once | INTRAMUSCULAR | Status: AC
  Administered 2012-07-04: 480 ug via SUBCUTANEOUS
  Filled 2012-07-04: qty 0.8

## 2012-07-04 NOTE — Patient Instructions (Addendum)
[facility] Discharge Instructions for Patients  Today you received the following: Neupogen   BELOW ARE SYMPTOMS THAT SHOULD BE REPORTED IMMEDIATELY:  *FEVER GREATER THAN 100.5 F  *CHILLS WITH OR WITHOUT FEVER  NAUSEA AND VOMITING THAT IS NOT CONTROLLED WITH YOUR NAUSEA MEDICATION  *UNUSUAL SHORTNESS OF BREATH  *UNUSUAL BRUISING OR BLEEDING  TENDERNESS IN MOUTH AND THROAT WITH OR WITHOUT PRESENCE OF ULCERS  *URINARY PROBLEMS  *BOWEL PROBLEMS  UNUSUAL RASH Items with * indicate a potential emergency and should be followed up as soon as possible.   Feel free to call the clinic you have any questions or concerns. The clinic phone number is [phone].   Filgrastim, G-CSF injection What is this medicine? FILGRASTIM, G-CSF (fil GRA stim) stimulates the formation of white blood cells. This medicine is given to patients with conditions that may cause a decrease in white blood cells, like those receiving certain types of chemotherapy or bone marrow transplant. It helps the bone marrow recover its ability to produce white blood cells. Increasing the amount of white blood cells helps to decrease the risk of infection and fever. This medicine may be used for other purposes; ask your health care Onyx Schirmer or pharmacist if you have questions. What should I tell my health care Elma Shands before I take this medicine? They need to know if you have any of these conditions: -currently receiving radiation therapy -sickle cell disease -an unusual or allergic reaction to filgrastim, E. coli protein, other medicines, foods, dyes, or preservatives -pregnant or trying to get pregnant -breast-feeding How should I use this medicine? This medicine is for injection into a vein or injection under the skin. It is usually given by a health care professional in a hospital or clinic setting. If you get this medicine at home, you will be taught how to prepare and give this medicine. Always  change the site for the injection under the skin. Let the solution warm to room temperature before you use it. Do not shake the solution before you withdraw a dose. Throw away any unused portion. Use exactly as directed. Take your medicine at regular intervals. Do not take your medicine more often than directed. It is important that you put your used needles and syringes in a special sharps container. Do not put them in a trash can. If you do not have a sharps container, call your pharmacist or healthcare Daveah Varone to get one. Talk to your pediatrician regarding the use of this medicine in children. While this medicine may be prescribed for children for selected conditions, precautions do apply. Overdosage: If you think you have taken too much of this medicine contact a poison control center or emergency room at once. NOTE: This medicine is only for you. Do not share this medicine with others. What if I miss a dose? Try not to miss doses. If you miss a dose take the dose as soon as you remember. If it is almost time for the next dose, do not take double doses unless told to by your doctor or health care professional. What may interact with this medicine? -lithium -medicines for cancer chemotherapy This list may not describe all possible interactions. Give your health care Laretha Luepke a list of all the medicines, herbs, non-prescription drugs, or dietary supplements you use. Also tell them if you smoke, drink alcohol, or use illegal drugs. Some items may interact with your medicine. What should I watch for while using this medicine? Visit your doctor or health care professional  for regular checks on your progress. If you get a fever or any sign of infection while you are using this medicine, do not treat yourself. Check with your doctor or health care professional. Bone pain can usually be relieved by mild pain relievers such as acetaminophen or ibuprofen. Check with your doctor or health care professional  before taking these medicines as they may hide a fever. Call your doctor or health care professional if the aches and pains are severe or do not go away. What side effects may I notice from receiving this medicine? Side effects that you should report to your doctor or health care professional as soon as possible: -allergic reactions like skin rash, itching or hives, swelling of the face, lips, or tongue -difficulty breathing, wheezing -fever -pain, redness, or swelling at the injection site -stomach or side pain, or pain at the shoulder Side effects that usually do not require medical attention (report to your doctor or health care professional if they continue or are bothersome): -bone pain (ribs, lower back, breast bone) -headache -skin rash This list may not describe all possible side effects. Call your doctor for medical advice about side effects. You may report side effects to FDA at 1-800-FDA-1088. Where should I keep my medicine? Keep out of the reach of children. Store in a refrigerator between 2 and 8 degrees C (36 and 46 degrees F). Do not freeze or leave in direct sunlight. If vials or syringes are left out of the refrigerator for more than 24 hours, they must be thrown away. Throw away unused vials after the expiration date on the carton. NOTE: This sheet is a summary. It may not cover all possible information. If you have questions about this medicine, talk to your doctor, pharmacist, or health care Charyl Minervini.  2013, [employer]. (07/02/2007 1:33:21 PM)

## 2012-07-04 NOTE — Patient Instructions (Addendum)
Hold chemotherapy today  Begin neupogen on 3/7, 3/8, 3/10, 3/11, 3/12
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
## 2012-07-05 ENCOUNTER — Ambulatory Visit: Payer: BC Managed Care – PPO

## 2012-07-05 ENCOUNTER — Ambulatory Visit (HOSPITAL_BASED_OUTPATIENT_CLINIC_OR_DEPARTMENT_OTHER): Payer: BC Managed Care – PPO

## 2012-07-05 VITALS — BP 150/80 | HR 88 | Temp 98.5°F

## 2012-07-05 DIAGNOSIS — Z5189 Encounter for other specified aftercare: Secondary | ICD-10-CM

## 2012-07-05 DIAGNOSIS — C50519 Malignant neoplasm of lower-outer quadrant of unspecified female breast: Secondary | ICD-10-CM

## 2012-07-05 DIAGNOSIS — C50511 Malignant neoplasm of lower-outer quadrant of right female breast: Secondary | ICD-10-CM

## 2012-07-05 MED ORDER — FILGRASTIM 480 MCG/0.8ML IJ SOLN
480.0000 ug | Freq: Once | INTRAMUSCULAR | Status: AC
  Administered 2012-07-05: 480 ug via SUBCUTANEOUS

## 2012-07-07 ENCOUNTER — Ambulatory Visit (HOSPITAL_BASED_OUTPATIENT_CLINIC_OR_DEPARTMENT_OTHER): Payer: BC Managed Care – PPO

## 2012-07-07 VITALS — BP 137/86 | HR 85 | Temp 97.9°F

## 2012-07-07 DIAGNOSIS — C50519 Malignant neoplasm of lower-outer quadrant of unspecified female breast: Secondary | ICD-10-CM

## 2012-07-07 DIAGNOSIS — C50511 Malignant neoplasm of lower-outer quadrant of right female breast: Secondary | ICD-10-CM

## 2012-07-07 DIAGNOSIS — Z5189 Encounter for other specified aftercare: Secondary | ICD-10-CM

## 2012-07-07 MED ORDER — FILGRASTIM 480 MCG/0.8ML IJ SOLN
480.0000 ug | Freq: Once | INTRAMUSCULAR | Status: AC
  Administered 2012-07-07: 480 ug via SUBCUTANEOUS
  Filled 2012-07-07: qty 0.8

## 2012-07-07 NOTE — Patient Instructions (Signed)
[facility] Discharge Instructions for Patients Receiving Neupogen Today you received the following agent: Neuopogen  To help prevent nausea and vomiting after your treatment, we encourage you to take your nausea medication as per Dr. Welton Flakes. If you develop nausea and vomiting that is not controlled by your nausea medication, call the clinic. If it is after clinic hours your family physician or the after hours number for the clinic or go to the Emergency Department.   BELOW ARE SYMPTOMS THAT SHOULD BE REPORTED IMMEDIATELY:  *FEVER GREATER THAN 100.5 F  *CHILLS WITH OR WITHOUT FEVER  NAUSEA AND VOMITING THAT IS NOT CONTROLLED WITH YOUR NAUSEA MEDICATION  *UNUSUAL SHORTNESS OF BREATH  *UNUSUAL BRUISING OR BLEEDING  TENDERNESS IN MOUTH AND THROAT WITH OR WITHOUT PRESENCE OF ULCERS  *URINARY PROBLEMS  *BOWEL PROBLEMS  UNUSUAL RASH Items with * indicate a potential emergency and should be followed up as soon as possible.  . Please let the nurse know about any problems that you may have experienced. Feel free to call the clinic you have any questions or concerns. The clinic phone number is [phone].   I have been informed and understand all the instructions given to me. I know to contact the clinic, my physician, or go to the Emergency Department if any problems should occur. I do not have any questions at this time, but understand that I may call the clinic during office hours   should I have any questions or need assistance in obtaining follow up care.    __________________________________________  _____________  __________ Signature of Patient or Authorized Representative            Date                   Time    __________________________________________ Nurse's Signature

## 2012-07-08 ENCOUNTER — Ambulatory Visit (HOSPITAL_BASED_OUTPATIENT_CLINIC_OR_DEPARTMENT_OTHER): Payer: BC Managed Care – PPO

## 2012-07-08 ENCOUNTER — Encounter: Payer: Self-pay | Admitting: Oncology

## 2012-07-08 VITALS — BP 126/77 | HR 79 | Temp 98.2°F

## 2012-07-08 DIAGNOSIS — C50511 Malignant neoplasm of lower-outer quadrant of right female breast: Secondary | ICD-10-CM

## 2012-07-08 DIAGNOSIS — Z5189 Encounter for other specified aftercare: Secondary | ICD-10-CM

## 2012-07-08 DIAGNOSIS — C50219 Malignant neoplasm of upper-inner quadrant of unspecified female breast: Secondary | ICD-10-CM

## 2012-07-08 MED ORDER — FILGRASTIM 480 MCG/0.8ML IJ SOLN
480.0000 ug | Freq: Once | INTRAMUSCULAR | Status: AC
  Administered 2012-07-08: 480 ug via SUBCUTANEOUS
  Filled 2012-07-08: qty 0.8

## 2012-07-08 NOTE — Progress Notes (Signed)
Patient bought in EOB from [facility] for January. She started [facility] 04/30/12. I sent to Peyton Najjar to get filed to [employer].

## 2012-07-09 ENCOUNTER — Ambulatory Visit (HOSPITAL_BASED_OUTPATIENT_CLINIC_OR_DEPARTMENT_OTHER): Payer: BC Managed Care – PPO

## 2012-07-09 VITALS — BP 130/84 | HR 90 | Temp 98.1°F

## 2012-07-09 DIAGNOSIS — Z5189 Encounter for other specified aftercare: Secondary | ICD-10-CM

## 2012-07-09 DIAGNOSIS — C50519 Malignant neoplasm of lower-outer quadrant of unspecified female breast: Secondary | ICD-10-CM

## 2012-07-09 DIAGNOSIS — C50511 Malignant neoplasm of lower-outer quadrant of right female breast: Secondary | ICD-10-CM

## 2012-07-09 MED ORDER — FILGRASTIM 480 MCG/0.8ML IJ SOLN
480.0000 ug | Freq: Once | INTRAMUSCULAR | Status: AC
  Administered 2012-07-09: 480 ug via SUBCUTANEOUS
  Filled 2012-07-09: qty 0.8

## 2012-07-10 ENCOUNTER — Encounter: Payer: Self-pay | Admitting: Oncology

## 2012-07-10 NOTE — Progress Notes (Signed)
I sent request to Peyton Najjar for all jan 2014 be billed to [facility]., he said all was filed. I called and let patient know all billed.

## 2012-07-11 ENCOUNTER — Ambulatory Visit (HOSPITAL_BASED_OUTPATIENT_CLINIC_OR_DEPARTMENT_OTHER): Payer: BC Managed Care – PPO | Admitting: Adult Health

## 2012-07-11 ENCOUNTER — Ambulatory Visit (HOSPITAL_BASED_OUTPATIENT_CLINIC_OR_DEPARTMENT_OTHER): Payer: BC Managed Care – PPO

## 2012-07-11 ENCOUNTER — Other Ambulatory Visit (HOSPITAL_BASED_OUTPATIENT_CLINIC_OR_DEPARTMENT_OTHER): Payer: BC Managed Care – PPO | Admitting: Lab

## 2012-07-11 ENCOUNTER — Encounter: Payer: Self-pay | Admitting: Adult Health

## 2012-07-11 ENCOUNTER — Telehealth: Payer: Self-pay | Admitting: *Deleted

## 2012-07-11 VITALS — BP 147/84 | HR 67 | Temp 97.8°F | Resp 20 | Ht 66.0 in | Wt 164.8 lb

## 2012-07-11 DIAGNOSIS — Z5111 Encounter for antineoplastic chemotherapy: Secondary | ICD-10-CM

## 2012-07-11 DIAGNOSIS — C50511 Malignant neoplasm of lower-outer quadrant of right female breast: Secondary | ICD-10-CM

## 2012-07-11 DIAGNOSIS — C50519 Malignant neoplasm of lower-outer quadrant of unspecified female breast: Secondary | ICD-10-CM

## 2012-07-11 DIAGNOSIS — G589 Mononeuropathy, unspecified: Secondary | ICD-10-CM

## 2012-07-11 DIAGNOSIS — Z170 Estrogen receptor positive status [ER+]: Secondary | ICD-10-CM

## 2012-07-11 LAB — CBC WITH DIFFERENTIAL/PLATELET
HGB: 11.2 g/dL — ABNORMAL LOW (ref 11.6–15.9)
MCH: 24.5 pg — ABNORMAL LOW (ref 25.1–34.0)
MCHC: 31.7 g/dL (ref 31.5–36.0)
MCV: 77.1 fL — ABNORMAL LOW (ref 79.5–101.0)
RBC: 4.58 10*6/uL (ref 3.70–5.45)
RDW: 17.7 % — ABNORMAL HIGH (ref 11.2–14.5)

## 2012-07-11 LAB — COMPREHENSIVE METABOLIC PANEL (CC13)
Albumin: 3.5 g/dL (ref 3.5–5.0)
BUN: 8 mg/dL (ref 7.0–26.0)
Calcium: 9.3 mg/dL (ref 8.4–10.4)
Chloride: 109 mEq/L — ABNORMAL HIGH (ref 98–107)
Creatinine: 0.7 mg/dL (ref 0.6–1.1)
Glucose: 92 mg/dl (ref 70–99)
Potassium: 3.7 mEq/L (ref 3.5–5.1)

## 2012-07-11 LAB — MANUAL DIFFERENTIAL
ALC: 3.1 10*3/uL (ref 0.9–3.3)
Band Neutrophils: 16 % — ABNORMAL HIGH (ref 0–10)
Blasts: 0 % (ref 0–0)
Myelocytes: 0 % (ref 0–0)
Other Cell: 0 % (ref 0–0)
PLT EST: ADEQUATE
PROMYELO: 0 % (ref 0–0)
SEG: 59 % (ref 38–77)
Variant Lymph: 0 % (ref 0–0)

## 2012-07-11 MED ORDER — SODIUM CHLORIDE 0.9 % IV SOLN
Freq: Once | INTRAVENOUS | Status: AC
  Administered 2012-07-11: 12:00:00 via INTRAVENOUS

## 2012-07-11 MED ORDER — ONDANSETRON 8 MG/50ML IVPB (CHCC)
8.0000 mg | Freq: Once | INTRAVENOUS | Status: AC
  Administered 2012-07-11: 8 mg via INTRAVENOUS

## 2012-07-11 MED ORDER — SODIUM CHLORIDE 0.9 % IJ SOLN
10.0000 mL | INTRAMUSCULAR | Status: DC | PRN
  Administered 2012-07-11: 10 mL
  Filled 2012-07-11: qty 10

## 2012-07-11 MED ORDER — PACLITAXEL PROTEIN-BOUND CHEMO INJECTION 100 MG
100.0000 mg/m2 | Freq: Once | INTRAVENOUS | Status: AC
  Administered 2012-07-11: 175 mg via INTRAVENOUS
  Filled 2012-07-11: qty 35

## 2012-07-11 MED ORDER — DEXAMETHASONE SODIUM PHOSPHATE 10 MG/ML IJ SOLN
10.0000 mg | Freq: Once | INTRAMUSCULAR | Status: AC
  Administered 2012-07-11: 10 mg via INTRAVENOUS

## 2012-07-11 MED ORDER — HEPARIN SOD (PORK) LOCK FLUSH 100 UNIT/ML IV SOLN
500.0000 [IU] | Freq: Once | INTRAVENOUS | Status: AC | PRN
  Administered 2012-07-11: 500 [IU]
  Filled 2012-07-11: qty 5

## 2012-07-11 NOTE — Telephone Encounter (Signed)
appts made and printed for the pt  °

## 2012-07-11 NOTE — Patient Instructions (Addendum)
[facility] Discharge Instructions for Patients Receiving Chemotherapy  Today you received the following chemotherapy agents Abraxane.   To help prevent nausea and vomiting after your treatment, we encourage you to take your nausea medication.   If you develop nausea and vomiting that is not controlled by your nausea medication, call the clinic.   BELOW ARE SYMPTOMS THAT SHOULD BE REPORTED IMMEDIATELY:  *FEVER GREATER THAN 100.5 F  *CHILLS WITH OR WITHOUT FEVER  NAUSEA AND VOMITING THAT IS NOT CONTROLLED WITH YOUR NAUSEA MEDICATION  *UNUSUAL SHORTNESS OF BREATH  *UNUSUAL BRUISING OR BLEEDING  TENDERNESS IN MOUTH AND THROAT WITH OR WITHOUT PRESENCE OF ULCERS  *URINARY PROBLEMS  *BOWEL PROBLEMS  UNUSUAL RASH Items with * indicate a potential emergency and should be followed up as soon as possible.  Feel free to call the clinic you have any questions or concerns. The clinic phone number is [phone].    

## 2012-07-11 NOTE — Patient Instructions (Addendum)
Doing well, proceed with chemotherapy.  Please call us if you have any questions or concerns.    
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
## 2012-07-14 ENCOUNTER — Telehealth: Payer: Self-pay | Admitting: *Deleted

## 2012-07-14 NOTE — Telephone Encounter (Signed)
sw pt appts d/t was given.

## 2012-07-18 ENCOUNTER — Ambulatory Visit: Payer: PRIVATE HEALTH INSURANCE

## 2012-07-18 ENCOUNTER — Ambulatory Visit (HOSPITAL_BASED_OUTPATIENT_CLINIC_OR_DEPARTMENT_OTHER): Payer: BC Managed Care – PPO | Admitting: Adult Health

## 2012-07-18 ENCOUNTER — Telehealth: Payer: Self-pay | Admitting: *Deleted

## 2012-07-18 ENCOUNTER — Encounter: Payer: Self-pay | Admitting: Adult Health

## 2012-07-18 ENCOUNTER — Other Ambulatory Visit (HOSPITAL_BASED_OUTPATIENT_CLINIC_OR_DEPARTMENT_OTHER): Payer: BC Managed Care – PPO | Admitting: Lab

## 2012-07-18 VITALS — BP 122/58 | HR 91 | Temp 98.0°F | Resp 20 | Ht 66.0 in | Wt 163.5 lb

## 2012-07-18 DIAGNOSIS — D709 Neutropenia, unspecified: Secondary | ICD-10-CM

## 2012-07-18 DIAGNOSIS — C773 Secondary and unspecified malignant neoplasm of axilla and upper limb lymph nodes: Secondary | ICD-10-CM

## 2012-07-18 DIAGNOSIS — C50519 Malignant neoplasm of lower-outer quadrant of unspecified female breast: Secondary | ICD-10-CM

## 2012-07-18 DIAGNOSIS — C50511 Malignant neoplasm of lower-outer quadrant of right female breast: Secondary | ICD-10-CM

## 2012-07-18 DIAGNOSIS — M898X9 Other specified disorders of bone, unspecified site: Secondary | ICD-10-CM

## 2012-07-18 DIAGNOSIS — G589 Mononeuropathy, unspecified: Secondary | ICD-10-CM

## 2012-07-18 LAB — CBC WITH DIFFERENTIAL/PLATELET
Basophils Absolute: 0 10*3/uL (ref 0.0–0.1)
EOS%: 1.1 % (ref 0.0–7.0)
Eosinophils Absolute: 0 10*3/uL (ref 0.0–0.5)
HGB: 10.9 g/dL — ABNORMAL LOW (ref 11.6–15.9)
MCH: 24.3 pg — ABNORMAL LOW (ref 25.1–34.0)
MONO#: 0.2 10*3/uL (ref 0.1–0.9)
NEUT#: 0.6 10*3/uL — ABNORMAL LOW (ref 1.5–6.5)
RDW: 17.1 % — ABNORMAL HIGH (ref 11.2–14.5)
WBC: 1.8 10*3/uL — ABNORMAL LOW (ref 3.9–10.3)
lymph#: 0.9 10*3/uL (ref 0.9–3.3)

## 2012-07-18 MED ORDER — HYDROCODONE-ACETAMINOPHEN 5-325 MG PO TABS: 1.0000 | ORAL_TABLET | ORAL | Status: DC | PRN

## 2012-07-18 MED ORDER — FILGRASTIM 480 MCG/0.8ML IJ SOLN
480.0000 ug | Freq: Once | INTRAMUSCULAR | Status: AC
  Administered 2012-07-18: 480 ug via SUBCUTANEOUS
  Filled 2012-07-18: qty 0.8
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
## 2012-07-18 NOTE — Telephone Encounter (Signed)
appts made and printed 

## 2012-07-18 NOTE — Patient Instructions (Addendum)
  Patient Neutropenia Instruction Sheet  Diagnosis: Breast Cancer      Treating Physician: Drue Second, MD  Treatment: 1. Type of chemotherapy: Abraxane 2. Date of last treatment: 07/11/12  Last Blood Counts: Lab Results  Component Value Date   WBC 1.8* 07/18/2012   HGB 10.9* 07/18/2012   HCT 34.6* 07/18/2012   MCV 77.1* 07/18/2012   PLT 249 07/18/2012   ANC 600     Prophylactic Antibiotics: Cipro 500 mg by mouth twice a day Instructions: 1. Monitor temperature and call if fever  greater than 100.5, chills, shaking chills (rigors) 2. Call Physician on-call at [phone] 3. Give him/her symptoms and list of medications that you are taking and your last blood count.

## 2012-07-19 ENCOUNTER — Other Ambulatory Visit: Payer: Self-pay | Admitting: Oncology

## 2012-07-19 ENCOUNTER — Other Ambulatory Visit: Payer: Self-pay | Admitting: Medical Oncology

## 2012-07-19 ENCOUNTER — Ambulatory Visit (HOSPITAL_BASED_OUTPATIENT_CLINIC_OR_DEPARTMENT_OTHER): Payer: BC Managed Care – PPO

## 2012-07-19 VITALS — BP 136/89 | HR 105 | Temp 97.1°F

## 2012-07-19 DIAGNOSIS — C50519 Malignant neoplasm of lower-outer quadrant of unspecified female breast: Secondary | ICD-10-CM

## 2012-07-19 DIAGNOSIS — D709 Neutropenia, unspecified: Secondary | ICD-10-CM

## 2012-07-19 DIAGNOSIS — C50511 Malignant neoplasm of lower-outer quadrant of right female breast: Secondary | ICD-10-CM

## 2012-07-19 MED ORDER — FILGRASTIM 480 MCG/0.8ML IJ SOLN
480.0000 ug | Freq: Once | INTRAMUSCULAR | Status: AC
  Administered 2012-07-19: 480 ug via SUBCUTANEOUS

## 2012-07-21 ENCOUNTER — Ambulatory Visit (HOSPITAL_BASED_OUTPATIENT_CLINIC_OR_DEPARTMENT_OTHER): Payer: BC Managed Care – PPO

## 2012-07-21 VITALS — BP 147/93 | HR 85 | Temp 98.0°F

## 2012-07-21 DIAGNOSIS — C50519 Malignant neoplasm of lower-outer quadrant of unspecified female breast: Secondary | ICD-10-CM

## 2012-07-21 DIAGNOSIS — C50511 Malignant neoplasm of lower-outer quadrant of right female breast: Secondary | ICD-10-CM

## 2012-07-21 DIAGNOSIS — D709 Neutropenia, unspecified: Secondary | ICD-10-CM

## 2012-07-21 MED ORDER — FILGRASTIM 480 MCG/0.8ML IJ SOLN
480.0000 ug | Freq: Once | INTRAMUSCULAR | Status: AC
Start: 2012-07-21 — End: 2012-07-21
  Administered 2012-07-21: 480 ug via SUBCUTANEOUS
  Filled 2012-07-21: qty 0.8

## 2012-07-22 ENCOUNTER — Ambulatory Visit (HOSPITAL_BASED_OUTPATIENT_CLINIC_OR_DEPARTMENT_OTHER): Payer: BC Managed Care – PPO

## 2012-07-22 VITALS — BP 133/74 | HR 81 | Temp 98.4°F

## 2012-07-22 DIAGNOSIS — D709 Neutropenia, unspecified: Secondary | ICD-10-CM

## 2012-07-22 DIAGNOSIS — C50511 Malignant neoplasm of lower-outer quadrant of right female breast: Secondary | ICD-10-CM

## 2012-07-22 MED ORDER — FILGRASTIM 480 MCG/0.8ML IJ SOLN
480.0000 ug | Freq: Once | INTRAMUSCULAR | Status: AC
  Administered 2012-07-22: 480 ug via SUBCUTANEOUS
  Filled 2012-07-22: qty 0.8

## 2012-07-23 ENCOUNTER — Ambulatory Visit (HOSPITAL_BASED_OUTPATIENT_CLINIC_OR_DEPARTMENT_OTHER): Payer: BC Managed Care – PPO

## 2012-07-23 VITALS — BP 139/91 | HR 85 | Temp 98.3°F

## 2012-07-23 DIAGNOSIS — C50519 Malignant neoplasm of lower-outer quadrant of unspecified female breast: Secondary | ICD-10-CM

## 2012-07-23 DIAGNOSIS — C50511 Malignant neoplasm of lower-outer quadrant of right female breast: Secondary | ICD-10-CM

## 2012-07-23 DIAGNOSIS — D709 Neutropenia, unspecified: Secondary | ICD-10-CM

## 2012-07-23 MED ORDER — FILGRASTIM 480 MCG/0.8ML IJ SOLN
480.0000 ug | Freq: Once | INTRAMUSCULAR | Status: AC
  Administered 2012-07-23: 480 ug via SUBCUTANEOUS
  Filled 2012-07-23: qty 0.8

## 2012-07-25 ENCOUNTER — Ambulatory Visit (HOSPITAL_BASED_OUTPATIENT_CLINIC_OR_DEPARTMENT_OTHER): Payer: BC Managed Care – PPO

## 2012-07-25 ENCOUNTER — Other Ambulatory Visit (HOSPITAL_BASED_OUTPATIENT_CLINIC_OR_DEPARTMENT_OTHER): Payer: BC Managed Care – PPO

## 2012-07-25 ENCOUNTER — Ambulatory Visit (HOSPITAL_BASED_OUTPATIENT_CLINIC_OR_DEPARTMENT_OTHER): Payer: BC Managed Care – PPO | Admitting: Adult Health

## 2012-07-25 ENCOUNTER — Encounter: Payer: Self-pay | Admitting: Adult Health

## 2012-07-25 VITALS — BP 137/90 | HR 91 | Temp 98.4°F | Resp 20 | Ht 66.0 in | Wt 160.3 lb

## 2012-07-25 DIAGNOSIS — C50519 Malignant neoplasm of lower-outer quadrant of unspecified female breast: Secondary | ICD-10-CM

## 2012-07-25 DIAGNOSIS — Z170 Estrogen receptor positive status [ER+]: Secondary | ICD-10-CM

## 2012-07-25 DIAGNOSIS — G609 Hereditary and idiopathic neuropathy, unspecified: Secondary | ICD-10-CM

## 2012-07-25 DIAGNOSIS — C50511 Malignant neoplasm of lower-outer quadrant of right female breast: Secondary | ICD-10-CM

## 2012-07-25 DIAGNOSIS — Z5111 Encounter for antineoplastic chemotherapy: Secondary | ICD-10-CM

## 2012-07-25 DIAGNOSIS — D709 Neutropenia, unspecified: Secondary | ICD-10-CM

## 2012-07-25 LAB — CBC WITH DIFFERENTIAL/PLATELET
BASO%: 0.4 % (ref 0.0–2.0)
Basophils Absolute: 0.1 10*3/uL (ref 0.0–0.1)
HCT: 37.2 % (ref 34.8–46.6)
HGB: 11.6 g/dL (ref 11.6–15.9)
LYMPH%: 9.2 % — ABNORMAL LOW (ref 14.0–49.7)
MCH: 23.8 pg — ABNORMAL LOW (ref 25.1–34.0)
MCHC: 31.2 g/dL — ABNORMAL LOW (ref 31.5–36.0)
MONO#: 1.5 10*3/uL — ABNORMAL HIGH (ref 0.1–0.9)
NEUT%: 83.3 % — ABNORMAL HIGH (ref 38.4–76.8)
Platelets: 330 10*3/uL (ref 145–400)
WBC: 22 10*3/uL — ABNORMAL HIGH (ref 3.9–10.3)
lymph#: 2 10*3/uL (ref 0.9–3.3)

## 2012-07-25 LAB — COMPREHENSIVE METABOLIC PANEL (CC13)
AST: 33 U/L (ref 5–34)
Albumin: 3.6 g/dL (ref 3.5–5.0)
BUN: 8.6 mg/dL (ref 7.0–26.0)
Calcium: 9.2 mg/dL (ref 8.4–10.4)
Chloride: 107 mEq/L (ref 98–107)
Creatinine: 0.8 mg/dL (ref 0.6–1.1)
Glucose: 133 mg/dl — ABNORMAL HIGH (ref 70–99)

## 2012-07-25 MED ORDER — ONDANSETRON 8 MG/50ML IVPB (CHCC)
8.0000 mg | Freq: Once | INTRAVENOUS | Status: AC
  Administered 2012-07-25: 8 mg via INTRAVENOUS

## 2012-07-25 MED ORDER — SODIUM CHLORIDE 0.9 % IV SOLN
Freq: Once | INTRAVENOUS | Status: AC
  Administered 2012-07-25: 13:00:00 via INTRAVENOUS

## 2012-07-25 MED ORDER — PACLITAXEL PROTEIN-BOUND CHEMO INJECTION 100 MG
100.0000 mg/m2 | Freq: Once | INTRAVENOUS | Status: AC
  Administered 2012-07-25: 175 mg via INTRAVENOUS
  Filled 2012-07-25: qty 35

## 2012-07-25 MED ORDER — DEXAMETHASONE SODIUM PHOSPHATE 10 MG/ML IJ SOLN
10.0000 mg | Freq: Once | INTRAMUSCULAR | Status: AC
  Administered 2012-07-25: 10 mg via INTRAVENOUS

## 2012-07-25 MED ORDER — SODIUM CHLORIDE 0.9 % IJ SOLN
10.0000 mL | INTRAMUSCULAR | Status: DC | PRN
  Administered 2012-07-25: 10 mL
  Filled 2012-07-25: qty 10

## 2012-07-25 MED ORDER — HEPARIN SOD (PORK) LOCK FLUSH 100 UNIT/ML IV SOLN
500.0000 [IU] | Freq: Once | INTRAVENOUS | Status: AC | PRN
  Administered 2012-07-25: 500 [IU]
  Filled 2012-07-25: qty 5
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
## 2012-07-25 NOTE — Patient Instructions (Addendum)
Proceed with chemotherapy.  We will give you Neupogen after every treatment.

## 2012-07-25 NOTE — Patient Instructions (Addendum)
[facility] Discharge Instructions for Patients Receiving Chemotherapy  Today you received the following chemotherapy agents ABRAXANE To help prevent nausea and vomiting after your treatment, we encourage you to take your nausea medication   Take it as often as prescribed.   If you develop nausea and vomiting that is not controlled by your nausea medication, call the clinic. If it is after clinic hours your family physician or the after hours number for the clinic or go to the Emergency Department.   BELOW ARE SYMPTOMS THAT SHOULD BE REPORTED IMMEDIATELY:  *FEVER GREATER THAN 100.5 F  *CHILLS WITH OR WITHOUT FEVER  NAUSEA AND VOMITING THAT IS NOT CONTROLLED WITH YOUR NAUSEA MEDICATION  *UNUSUAL SHORTNESS OF BREATH  *UNUSUAL BRUISING OR BLEEDING  TENDERNESS IN MOUTH AND THROAT WITH OR WITHOUT PRESENCE OF ULCERS  *URINARY PROBLEMS  *BOWEL PROBLEMS  UNUSUAL RASH Items with * indicate a potential emergency and should be followed up as soon as possible.  If this is your first treatment one of the nurses will contact you 24 hours after your treatment. Please let the nurse know about any problems that you may have experienced. Feel free to call the clinic you have any questions or concerns. The clinic phone number is [phone].   I have been informed and understand all the instructions given to me. I know to contact the clinic, my physician, or go to the Emergency Department if any problems should occur. I do not have any questions at this time, but understand that I may call the clinic during office hours   should I have any questions or need assistance in obtaining follow up care.    __________________________________________  _____________  __________ Signature of Patient or Authorized Representative            Date                   Time    __________________________________________ Nurse's Signature

## 2012-07-26 ENCOUNTER — Ambulatory Visit (HOSPITAL_BASED_OUTPATIENT_CLINIC_OR_DEPARTMENT_OTHER): Payer: BC Managed Care – PPO

## 2012-07-26 VITALS — BP 124/88 | HR 71 | Temp 98.6°F | Resp 16

## 2012-07-26 DIAGNOSIS — C50511 Malignant neoplasm of lower-outer quadrant of right female breast: Secondary | ICD-10-CM

## 2012-07-26 DIAGNOSIS — D709 Neutropenia, unspecified: Secondary | ICD-10-CM

## 2012-07-26 MED ORDER — FILGRASTIM 480 MCG/0.8ML IJ SOLN
480.0000 ug | Freq: Once | INTRAMUSCULAR | Status: AC
  Administered 2012-07-26: 480 ug via SUBCUTANEOUS

## 2012-07-28 ENCOUNTER — Telehealth: Payer: Self-pay | Admitting: *Deleted

## 2012-07-28 ENCOUNTER — Ambulatory Visit (HOSPITAL_BASED_OUTPATIENT_CLINIC_OR_DEPARTMENT_OTHER): Payer: BC Managed Care – PPO

## 2012-07-28 VITALS — BP 140/88 | HR 92 | Temp 98.0°F

## 2012-07-28 DIAGNOSIS — D709 Neutropenia, unspecified: Secondary | ICD-10-CM

## 2012-07-28 DIAGNOSIS — C50511 Malignant neoplasm of lower-outer quadrant of right female breast: Secondary | ICD-10-CM

## 2012-07-28 MED ORDER — FILGRASTIM 480 MCG/0.8ML IJ SOLN
480.0000 ug | Freq: Once | INTRAMUSCULAR | Status: AC
  Administered 2012-07-28: 480 ug via SUBCUTANEOUS
  Filled 2012-07-28: qty 0.8

## 2012-07-28 NOTE — Telephone Encounter (Signed)
sw pt gv appts d/t for injections. Pt is aware.

## 2012-07-29 ENCOUNTER — Ambulatory Visit (HOSPITAL_BASED_OUTPATIENT_CLINIC_OR_DEPARTMENT_OTHER): Payer: BC Managed Care – PPO

## 2012-07-29 VITALS — BP 144/90 | HR 91 | Temp 98.3°F

## 2012-07-29 DIAGNOSIS — C50511 Malignant neoplasm of lower-outer quadrant of right female breast: Secondary | ICD-10-CM

## 2012-07-29 DIAGNOSIS — D709 Neutropenia, unspecified: Secondary | ICD-10-CM

## 2012-07-29 MED ORDER — FILGRASTIM 480 MCG/0.8ML IJ SOLN
480.0000 ug | Freq: Once | INTRAMUSCULAR | Status: AC
  Administered 2012-07-29: 480 ug via SUBCUTANEOUS
  Filled 2012-07-29: qty 0.8

## 2012-07-30 ENCOUNTER — Ambulatory Visit (HOSPITAL_BASED_OUTPATIENT_CLINIC_OR_DEPARTMENT_OTHER): Payer: BC Managed Care – PPO

## 2012-07-30 VITALS — BP 131/81 | HR 90 | Temp 98.1°F

## 2012-07-30 DIAGNOSIS — C50511 Malignant neoplasm of lower-outer quadrant of right female breast: Secondary | ICD-10-CM

## 2012-07-30 DIAGNOSIS — D709 Neutropenia, unspecified: Secondary | ICD-10-CM

## 2012-07-30 MED ORDER — FILGRASTIM 480 MCG/0.8ML IJ SOLN
480.0000 ug | Freq: Once | INTRAMUSCULAR | Status: AC
  Administered 2012-07-30: 480 ug via SUBCUTANEOUS
  Filled 2012-07-30: qty 0.8

## 2012-08-01 ENCOUNTER — Telehealth: Payer: Self-pay | Admitting: Oncology

## 2012-08-01 ENCOUNTER — Encounter: Payer: Self-pay | Admitting: Adult Health

## 2012-08-01 ENCOUNTER — Ambulatory Visit (HOSPITAL_BASED_OUTPATIENT_CLINIC_OR_DEPARTMENT_OTHER): Payer: BC Managed Care – PPO

## 2012-08-01 ENCOUNTER — Ambulatory Visit (HOSPITAL_BASED_OUTPATIENT_CLINIC_OR_DEPARTMENT_OTHER): Payer: BC Managed Care – PPO | Admitting: Adult Health

## 2012-08-01 ENCOUNTER — Other Ambulatory Visit (HOSPITAL_BASED_OUTPATIENT_CLINIC_OR_DEPARTMENT_OTHER): Payer: BC Managed Care – PPO | Admitting: Lab

## 2012-08-01 VITALS — BP 154/80 | HR 67 | Temp 97.8°F | Resp 20 | Ht 66.0 in | Wt 159.5 lb

## 2012-08-01 DIAGNOSIS — G589 Mononeuropathy, unspecified: Secondary | ICD-10-CM

## 2012-08-01 DIAGNOSIS — D709 Neutropenia, unspecified: Secondary | ICD-10-CM

## 2012-08-01 DIAGNOSIS — C50519 Malignant neoplasm of lower-outer quadrant of unspecified female breast: Secondary | ICD-10-CM

## 2012-08-01 DIAGNOSIS — C50511 Malignant neoplasm of lower-outer quadrant of right female breast: Secondary | ICD-10-CM

## 2012-08-01 DIAGNOSIS — Z170 Estrogen receptor positive status [ER+]: Secondary | ICD-10-CM

## 2012-08-01 DIAGNOSIS — Z5111 Encounter for antineoplastic chemotherapy: Secondary | ICD-10-CM

## 2012-08-01 LAB — CBC WITH DIFFERENTIAL/PLATELET
BASO%: 0.2 % (ref 0.0–2.0)
HCT: 35 % (ref 34.8–46.6)
LYMPH%: 27.9 % (ref 14.0–49.7)
MCH: 23.6 pg — ABNORMAL LOW (ref 25.1–34.0)
MCHC: 31.4 g/dL — ABNORMAL LOW (ref 31.5–36.0)
MONO#: 0.5 10*3/uL (ref 0.1–0.9)
NEUT%: 62.1 % (ref 38.4–76.8)
Platelets: 257 10*3/uL (ref 145–400)

## 2012-08-01 LAB — COMPREHENSIVE METABOLIC PANEL (CC13)
AST: 47 U/L — ABNORMAL HIGH (ref 5–34)
BUN: 7.2 mg/dL (ref 7.0–26.0)
Calcium: 9.2 mg/dL (ref 8.4–10.4)
Chloride: 107 mEq/L (ref 98–107)
Creatinine: 0.7 mg/dL (ref 0.6–1.1)

## 2012-08-01 MED ORDER — PACLITAXEL PROTEIN-BOUND CHEMO INJECTION 100 MG
100.0000 mg/m2 | Freq: Once | INTRAVENOUS | Status: AC
  Administered 2012-08-01: 175 mg via INTRAVENOUS
  Filled 2012-08-01: qty 35

## 2012-08-01 MED ORDER — SODIUM CHLORIDE 0.9 % IJ SOLN
10.0000 mL | INTRAMUSCULAR | Status: DC | PRN
  Administered 2012-08-01: 10 mL
  Filled 2012-08-01: qty 10

## 2012-08-01 MED ORDER — DEXAMETHASONE SODIUM PHOSPHATE 10 MG/ML IJ SOLN
10.0000 mg | Freq: Once | INTRAMUSCULAR | Status: AC
  Administered 2012-08-01: 10 mg via INTRAVENOUS

## 2012-08-01 MED ORDER — ONDANSETRON 8 MG/50ML IVPB (CHCC)
8.0000 mg | Freq: Once | INTRAVENOUS | Status: AC
  Administered 2012-08-01: 8 mg via INTRAVENOUS

## 2012-08-01 MED ORDER — HEPARIN SOD (PORK) LOCK FLUSH 100 UNIT/ML IV SOLN
500.0000 [IU] | Freq: Once | INTRAVENOUS | Status: AC | PRN
  Administered 2012-08-01: 500 [IU]
  Filled 2012-08-01: qty 5

## 2012-08-01 MED ORDER — SODIUM CHLORIDE 0.9 % IV SOLN
Freq: Once | INTRAVENOUS | Status: AC
  Administered 2012-08-01: 12:00:00 via INTRAVENOUS

## 2012-08-01 NOTE — Patient Instructions (Addendum)
[facility] Discharge Instructions for Patients Receiving Chemotherapy  Today you received the following chemotherapy agents ABRAXANE To help prevent nausea and vomiting after your treatment, we encourage you to take your nausea medication   Take it as often as prescribed.   If you develop nausea and vomiting that is not controlled by your nausea medication, call the clinic. If it is after clinic hours your family physician or the after hours number for the clinic or go to the Emergency Department.   BELOW ARE SYMPTOMS THAT SHOULD BE REPORTED IMMEDIATELY:  *FEVER GREATER THAN 100.5 F  *CHILLS WITH OR WITHOUT FEVER  NAUSEA AND VOMITING THAT IS NOT CONTROLLED WITH YOUR NAUSEA MEDICATION  *UNUSUAL SHORTNESS OF BREATH  *UNUSUAL BRUISING OR BLEEDING  TENDERNESS IN MOUTH AND THROAT WITH OR WITHOUT PRESENCE OF ULCERS  *URINARY PROBLEMS  *BOWEL PROBLEMS  UNUSUAL RASH Items with * indicate a potential emergency and should be followed up as soon as possible.   Feel free to call the clinic you have any questions or concerns. The clinic phone number is [phone].   I have been informed and understand all the instructions given to me. I know to contact the clinic, my physician, or go to the Emergency Department if any problems should occur. I do not have any questions at this time, but understand that I may call the clinic during office hours   should I have any questions or need assistance in obtaining follow up care.    __________________________________________  _____________  __________ Signature of Patient or Authorized Representative            Date                   Time    __________________________________________ Nurse's Signature
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
## 2012-08-01 NOTE — Patient Instructions (Addendum)
Doing well.  Proceed with chemotherapy.  Please call us if you have any questions or concerns.    

## 2012-08-02 ENCOUNTER — Ambulatory Visit (HOSPITAL_BASED_OUTPATIENT_CLINIC_OR_DEPARTMENT_OTHER): Payer: BC Managed Care – PPO

## 2012-08-02 VITALS — BP 145/95 | HR 79 | Temp 97.5°F

## 2012-08-02 DIAGNOSIS — C50512 Malignant neoplasm of lower-outer quadrant of left female breast: Secondary | ICD-10-CM

## 2012-08-02 DIAGNOSIS — D709 Neutropenia, unspecified: Secondary | ICD-10-CM

## 2012-08-02 MED ORDER — FILGRASTIM 480 MCG/0.8ML IJ SOLN
480.0000 ug | Freq: Once | INTRAMUSCULAR | Status: AC
  Administered 2012-08-02: 480 ug via SUBCUTANEOUS

## 2012-08-04 ENCOUNTER — Ambulatory Visit (HOSPITAL_BASED_OUTPATIENT_CLINIC_OR_DEPARTMENT_OTHER): Payer: BC Managed Care – PPO

## 2012-08-04 VITALS — BP 126/71 | HR 92 | Temp 98.3°F

## 2012-08-04 DIAGNOSIS — C50512 Malignant neoplasm of lower-outer quadrant of left female breast: Secondary | ICD-10-CM

## 2012-08-04 DIAGNOSIS — D709 Neutropenia, unspecified: Secondary | ICD-10-CM

## 2012-08-04 MED ORDER — FILGRASTIM 480 MCG/0.8ML IJ SOLN
480.0000 ug | Freq: Once | INTRAMUSCULAR | Status: AC
  Administered 2012-08-04: 480 ug via SUBCUTANEOUS
  Filled 2012-08-04: qty 0.8

## 2012-08-05 ENCOUNTER — Ambulatory Visit (HOSPITAL_BASED_OUTPATIENT_CLINIC_OR_DEPARTMENT_OTHER): Payer: BC Managed Care – PPO

## 2012-08-05 VITALS — BP 131/73 | HR 95 | Temp 98.1°F

## 2012-08-05 DIAGNOSIS — D709 Neutropenia, unspecified: Secondary | ICD-10-CM

## 2012-08-05 DIAGNOSIS — C50512 Malignant neoplasm of lower-outer quadrant of left female breast: Secondary | ICD-10-CM

## 2012-08-05 MED ORDER — FILGRASTIM 480 MCG/0.8ML IJ SOLN
480.0000 ug | Freq: Once | INTRAMUSCULAR | Status: AC
  Administered 2012-08-05: 480 ug via SUBCUTANEOUS
  Filled 2012-08-05: qty 0.8

## 2012-08-06 ENCOUNTER — Ambulatory Visit (HOSPITAL_BASED_OUTPATIENT_CLINIC_OR_DEPARTMENT_OTHER): Payer: BC Managed Care – PPO

## 2012-08-06 VITALS — BP 133/78 | HR 91 | Temp 98.2°F

## 2012-08-06 DIAGNOSIS — D709 Neutropenia, unspecified: Secondary | ICD-10-CM

## 2012-08-06 DIAGNOSIS — C50512 Malignant neoplasm of lower-outer quadrant of left female breast: Secondary | ICD-10-CM

## 2012-08-06 MED ORDER — FILGRASTIM 480 MCG/0.8ML IJ SOLN
480.0000 ug | Freq: Once | INTRAMUSCULAR | Status: AC
  Administered 2012-08-06: 480 ug via SUBCUTANEOUS
  Filled 2012-08-06: qty 0.8

## 2012-08-08 ENCOUNTER — Ambulatory Visit (HOSPITAL_BASED_OUTPATIENT_CLINIC_OR_DEPARTMENT_OTHER): Payer: BC Managed Care – PPO | Admitting: Adult Health

## 2012-08-08 ENCOUNTER — Encounter: Payer: Self-pay | Admitting: Adult Health

## 2012-08-08 ENCOUNTER — Ambulatory Visit (HOSPITAL_BASED_OUTPATIENT_CLINIC_OR_DEPARTMENT_OTHER): Payer: BC Managed Care – PPO

## 2012-08-08 ENCOUNTER — Other Ambulatory Visit (HOSPITAL_BASED_OUTPATIENT_CLINIC_OR_DEPARTMENT_OTHER): Payer: BC Managed Care – PPO | Admitting: Lab

## 2012-08-08 ENCOUNTER — Telehealth: Payer: Self-pay | Admitting: Oncology

## 2012-08-08 VITALS — BP 131/90 | HR 72 | Temp 98.1°F | Resp 20 | Ht 66.0 in | Wt 157.6 lb

## 2012-08-08 DIAGNOSIS — C773 Secondary and unspecified malignant neoplasm of axilla and upper limb lymph nodes: Secondary | ICD-10-CM

## 2012-08-08 DIAGNOSIS — D709 Neutropenia, unspecified: Secondary | ICD-10-CM

## 2012-08-08 DIAGNOSIS — C50511 Malignant neoplasm of lower-outer quadrant of right female breast: Secondary | ICD-10-CM

## 2012-08-08 DIAGNOSIS — G589 Mononeuropathy, unspecified: Secondary | ICD-10-CM

## 2012-08-08 DIAGNOSIS — C50519 Malignant neoplasm of lower-outer quadrant of unspecified female breast: Secondary | ICD-10-CM

## 2012-08-08 DIAGNOSIS — Z5112 Encounter for antineoplastic immunotherapy: Secondary | ICD-10-CM

## 2012-08-08 LAB — CBC WITH DIFFERENTIAL/PLATELET
BASO%: 0.2 % (ref 0.0–2.0)
EOS%: 0.3 % (ref 0.0–7.0)
MCH: 24 pg — ABNORMAL LOW (ref 25.1–34.0)
MCHC: 31.8 g/dL (ref 31.5–36.0)
NEUT%: 77.8 % — ABNORMAL HIGH (ref 38.4–76.8)
RDW: 18.5 % — ABNORMAL HIGH (ref 11.2–14.5)
lymph#: 1.5 10*3/uL (ref 0.9–3.3)

## 2012-08-08 LAB — COMPREHENSIVE METABOLIC PANEL (CC13)
ALT: 28 U/L (ref 0–55)
AST: 22 U/L (ref 5–34)
Calcium: 9.3 mg/dL (ref 8.4–10.4)
Chloride: 107 mEq/L (ref 98–107)
Creatinine: 0.7 mg/dL (ref 0.6–1.1)
Potassium: 3.7 mEq/L (ref 3.5–5.1)

## 2012-08-08 MED ORDER — HEPARIN SOD (PORK) LOCK FLUSH 100 UNIT/ML IV SOLN
500.0000 [IU] | Freq: Once | INTRAVENOUS | Status: AC | PRN
  Administered 2012-08-08: 500 [IU]
  Filled 2012-08-08: qty 5

## 2012-08-08 MED ORDER — SODIUM CHLORIDE 0.9 % IV SOLN
Freq: Once | INTRAVENOUS | Status: AC
  Administered 2012-08-08: 13:00:00 via INTRAVENOUS

## 2012-08-08 MED ORDER — PACLITAXEL PROTEIN-BOUND CHEMO INJECTION 100 MG
100.0000 mg/m2 | Freq: Once | INTRAVENOUS | Status: AC
  Administered 2012-08-08: 175 mg via INTRAVENOUS
  Filled 2012-08-08: qty 35

## 2012-08-08 MED ORDER — ONDANSETRON 8 MG/50ML IVPB (CHCC)
8.0000 mg | Freq: Once | INTRAVENOUS | Status: AC
  Administered 2012-08-08: 8 mg via INTRAVENOUS

## 2012-08-08 MED ORDER — SODIUM CHLORIDE 0.9 % IJ SOLN
10.0000 mL | INTRAMUSCULAR | Status: DC | PRN
  Administered 2012-08-08: 10 mL
  Filled 2012-08-08: qty 10

## 2012-08-08 MED ORDER — DEXAMETHASONE SODIUM PHOSPHATE 10 MG/ML IJ SOLN
10.0000 mg | Freq: Once | INTRAMUSCULAR | Status: AC
  Administered 2012-08-08: 10 mg via INTRAVENOUS

## 2012-08-08 NOTE — Patient Instructions (Addendum)
[facility] Discharge Instructions for Patients Receiving Chemotherapy  Today you received the following chemotherapy agents: Abraxane.  To help prevent nausea and vomiting after your treatment, we encourage you to take your nausea medication.    If you develop nausea and vomiting that is not controlled by your nausea medication, call the clinic.    BELOW ARE SYMPTOMS THAT SHOULD BE REPORTED IMMEDIATELY:  *FEVER GREATER THAN 100.5 F  *CHILLS WITH OR WITHOUT FEVER  NAUSEA AND VOMITING THAT IS NOT CONTROLLED WITH YOUR NAUSEA MEDICATION  *UNUSUAL SHORTNESS OF BREATH  *UNUSUAL BRUISING OR BLEEDING  TENDERNESS IN MOUTH AND THROAT WITH OR WITHOUT PRESENCE OF ULCERS  *URINARY PROBLEMS  *BOWEL PROBLEMS  UNUSUAL RASH Items with * indicate a potential emergency and should be followed up as soon as possible.    Please let the nurse know about any problems that you may have experienced. Feel free to call the clinic you have any questions or concerns. The clinic phone number is [phone].

## 2012-08-08 NOTE — Telephone Encounter (Signed)
, °

## 2012-08-08 NOTE — Patient Instructions (Addendum)
Doing well.  Proceed with chemotherapy.  Call me at [phone] if you decide you want to meet the financial care counselors next week.    Please call us if you have any questions or concerns.
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
## 2012-08-09 ENCOUNTER — Ambulatory Visit (HOSPITAL_BASED_OUTPATIENT_CLINIC_OR_DEPARTMENT_OTHER): Payer: BC Managed Care – PPO

## 2012-08-09 VITALS — BP 123/79 | HR 91 | Temp 97.9°F

## 2012-08-09 DIAGNOSIS — D709 Neutropenia, unspecified: Secondary | ICD-10-CM

## 2012-08-09 DIAGNOSIS — C50512 Malignant neoplasm of lower-outer quadrant of left female breast: Secondary | ICD-10-CM

## 2012-08-09 MED ORDER — FILGRASTIM 480 MCG/0.8ML IJ SOLN
480.0000 ug | Freq: Once | INTRAMUSCULAR | Status: AC
  Administered 2012-08-09: 480 ug via SUBCUTANEOUS

## 2012-08-09 NOTE — Patient Instructions (Signed)
Call MD for problems or concerns 

## 2012-08-11 ENCOUNTER — Ambulatory Visit (HOSPITAL_BASED_OUTPATIENT_CLINIC_OR_DEPARTMENT_OTHER): Payer: BC Managed Care – PPO

## 2012-08-11 VITALS — BP 128/72 | HR 83 | Temp 98.1°F

## 2012-08-11 DIAGNOSIS — C50512 Malignant neoplasm of lower-outer quadrant of left female breast: Secondary | ICD-10-CM

## 2012-08-11 DIAGNOSIS — D709 Neutropenia, unspecified: Secondary | ICD-10-CM

## 2012-08-11 MED ORDER — FILGRASTIM 480 MCG/0.8ML IJ SOLN
480.0000 ug | Freq: Once | INTRAMUSCULAR | Status: AC
  Administered 2012-08-11: 480 ug via SUBCUTANEOUS
  Filled 2012-08-11: qty 0.8

## 2012-08-12 ENCOUNTER — Ambulatory Visit (HOSPITAL_BASED_OUTPATIENT_CLINIC_OR_DEPARTMENT_OTHER): Payer: BC Managed Care – PPO

## 2012-08-12 VITALS — BP 134/73 | HR 100 | Temp 98.7°F

## 2012-08-12 DIAGNOSIS — C50512 Malignant neoplasm of lower-outer quadrant of left female breast: Secondary | ICD-10-CM

## 2012-08-12 DIAGNOSIS — D709 Neutropenia, unspecified: Secondary | ICD-10-CM

## 2012-08-12 MED ORDER — FILGRASTIM 480 MCG/0.8ML IJ SOLN
480.0000 ug | Freq: Once | INTRAMUSCULAR | Status: AC
  Administered 2012-08-12: 480 ug via SUBCUTANEOUS
  Filled 2012-08-12: qty 0.8

## 2012-08-13 ENCOUNTER — Ambulatory Visit (HOSPITAL_BASED_OUTPATIENT_CLINIC_OR_DEPARTMENT_OTHER): Payer: BC Managed Care – PPO

## 2012-08-13 VITALS — BP 138/79 | HR 83 | Temp 98.3°F

## 2012-08-13 DIAGNOSIS — D709 Neutropenia, unspecified: Secondary | ICD-10-CM

## 2012-08-13 DIAGNOSIS — C50512 Malignant neoplasm of lower-outer quadrant of left female breast: Secondary | ICD-10-CM

## 2012-08-13 MED ORDER — FILGRASTIM 480 MCG/0.8ML IJ SOLN
480.0000 ug | Freq: Once | INTRAMUSCULAR | Status: AC
  Administered 2012-08-13: 480 ug via SUBCUTANEOUS
  Filled 2012-08-13: qty 0.8

## 2012-08-15 ENCOUNTER — Telehealth: Payer: Self-pay | Admitting: Oncology

## 2012-08-15 ENCOUNTER — Other Ambulatory Visit (HOSPITAL_BASED_OUTPATIENT_CLINIC_OR_DEPARTMENT_OTHER): Payer: BC Managed Care – PPO | Admitting: Lab

## 2012-08-15 ENCOUNTER — Ambulatory Visit (HOSPITAL_BASED_OUTPATIENT_CLINIC_OR_DEPARTMENT_OTHER): Payer: BC Managed Care – PPO

## 2012-08-15 ENCOUNTER — Encounter: Payer: Self-pay | Admitting: Adult Health

## 2012-08-15 ENCOUNTER — Ambulatory Visit (HOSPITAL_BASED_OUTPATIENT_CLINIC_OR_DEPARTMENT_OTHER): Payer: BC Managed Care – PPO | Admitting: Adult Health

## 2012-08-15 VITALS — BP 124/86 | HR 87 | Temp 98.4°F | Resp 20 | Ht 66.0 in | Wt 161.0 lb

## 2012-08-15 DIAGNOSIS — G609 Hereditary and idiopathic neuropathy, unspecified: Secondary | ICD-10-CM

## 2012-08-15 DIAGNOSIS — D709 Neutropenia, unspecified: Secondary | ICD-10-CM

## 2012-08-15 DIAGNOSIS — Z5111 Encounter for antineoplastic chemotherapy: Secondary | ICD-10-CM

## 2012-08-15 DIAGNOSIS — C50519 Malignant neoplasm of lower-outer quadrant of unspecified female breast: Secondary | ICD-10-CM

## 2012-08-15 DIAGNOSIS — C50511 Malignant neoplasm of lower-outer quadrant of right female breast: Secondary | ICD-10-CM

## 2012-08-15 DIAGNOSIS — Z170 Estrogen receptor positive status [ER+]: Secondary | ICD-10-CM

## 2012-08-15 LAB — CBC WITH DIFFERENTIAL/PLATELET
Basophils Absolute: 0.1 10*3/uL (ref 0.0–0.1)
EOS%: 0.5 % (ref 0.0–7.0)
Eosinophils Absolute: 0 10*3/uL (ref 0.0–0.5)
HCT: 34.3 % — ABNORMAL LOW (ref 34.8–46.6)
HGB: 10.8 g/dL — ABNORMAL LOW (ref 11.6–15.9)
MCH: 24.2 pg — ABNORMAL LOW (ref 25.1–34.0)
NEUT#: 5.7 10*3/uL (ref 1.5–6.5)
NEUT%: 68.3 % (ref 38.4–76.8)
lymph#: 1.6 10*3/uL (ref 0.9–3.3)

## 2012-08-15 LAB — COMPREHENSIVE METABOLIC PANEL (CC13)
Albumin: 3.5 g/dL (ref 3.5–5.0)
Alkaline Phosphatase: 178 U/L — ABNORMAL HIGH (ref 40–150)
BUN: 8.2 mg/dL (ref 7.0–26.0)
CO2: 22 mEq/L (ref 22–29)
Calcium: 8.6 mg/dL (ref 8.4–10.4)
Chloride: 109 mEq/L — ABNORMAL HIGH (ref 98–107)
Glucose: 84 mg/dl (ref 70–99)
Potassium: 3.7 mEq/L (ref 3.5–5.1)
Sodium: 140 mEq/L (ref 136–145)
Total Protein: 7 g/dL (ref 6.4–8.3)

## 2012-08-15 MED ORDER — PACLITAXEL PROTEIN-BOUND CHEMO INJECTION 100 MG
100.0000 mg/m2 | Freq: Once | INTRAVENOUS | Status: AC
Start: 2012-08-15 — End: 2012-08-15
  Administered 2012-08-15: 175 mg via INTRAVENOUS
  Filled 2012-08-15: qty 35

## 2012-08-15 MED ORDER — ONDANSETRON 8 MG/50ML IVPB (CHCC): 8.0000 mg | Freq: Once | INTRAVENOUS | Status: DC

## 2012-08-15 MED ORDER — SODIUM CHLORIDE 0.9 % IV SOLN: Freq: Once | INTRAVENOUS | Status: DC

## 2012-08-15 MED ORDER — DEXAMETHASONE SODIUM PHOSPHATE 10 MG/ML IJ SOLN: 10.0000 mg | Freq: Once | INTRAMUSCULAR | Status: DC

## 2012-08-15 MED ORDER — LORAZEPAM 0.5 MG PO TABS: 0.5000 mg | ORAL_TABLET | Freq: Four times a day (QID) | ORAL | Status: DC | PRN

## 2012-08-15 MED ORDER — SODIUM CHLORIDE 0.9 % IJ SOLN
10.0000 mL | INTRAMUSCULAR | Status: DC | PRN
  Administered 2012-08-15: 10 mL
  Filled 2012-08-15: qty 10

## 2012-08-15 MED ORDER — HEPARIN SOD (PORK) LOCK FLUSH 100 UNIT/ML IV SOLN
500.0000 [IU] | Freq: Once | INTRAVENOUS | Status: AC | PRN
  Administered 2012-08-15: 500 [IU]
  Filled 2012-08-15: qty 5
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
## 2012-08-15 NOTE — Telephone Encounter (Signed)
, °

## 2012-08-15 NOTE — Patient Instructions (Signed)
Doing well.  Labs look good. Proceed with chemotherapy.  Please call us if you have any questions or concerns.

## 2012-08-16 ENCOUNTER — Ambulatory Visit (HOSPITAL_BASED_OUTPATIENT_CLINIC_OR_DEPARTMENT_OTHER): Payer: BC Managed Care – PPO

## 2012-08-16 ENCOUNTER — Other Ambulatory Visit: Payer: Self-pay | Admitting: Oncology

## 2012-08-16 DIAGNOSIS — C50519 Malignant neoplasm of lower-outer quadrant of unspecified female breast: Secondary | ICD-10-CM

## 2012-08-16 DIAGNOSIS — D709 Neutropenia, unspecified: Secondary | ICD-10-CM

## 2012-08-16 MED ORDER — FILGRASTIM 480 MCG/0.8ML IJ SOLN
480.0000 ug | Freq: Once | INTRAMUSCULAR | Status: AC
  Administered 2012-08-16: 480 ug via SUBCUTANEOUS

## 2012-08-18 ENCOUNTER — Ambulatory Visit (HOSPITAL_BASED_OUTPATIENT_CLINIC_OR_DEPARTMENT_OTHER): Payer: BC Managed Care – PPO

## 2012-08-18 VITALS — BP 128/73 | HR 90 | Temp 98.0°F

## 2012-08-18 DIAGNOSIS — C50519 Malignant neoplasm of lower-outer quadrant of unspecified female breast: Secondary | ICD-10-CM

## 2012-08-18 DIAGNOSIS — D709 Neutropenia, unspecified: Secondary | ICD-10-CM

## 2012-08-18 MED ORDER — FILGRASTIM 480 MCG/0.8ML IJ SOLN
480.0000 ug | Freq: Once | INTRAMUSCULAR | Status: AC
  Administered 2012-08-18: 480 ug via SUBCUTANEOUS
  Filled 2012-08-18: qty 0.8

## 2012-08-19 ENCOUNTER — Ambulatory Visit (HOSPITAL_BASED_OUTPATIENT_CLINIC_OR_DEPARTMENT_OTHER): Payer: BC Managed Care – PPO

## 2012-08-19 ENCOUNTER — Encounter: Payer: Self-pay | Admitting: *Deleted

## 2012-08-19 VITALS — BP 125/76 | HR 90 | Temp 98.1°F

## 2012-08-19 DIAGNOSIS — D709 Neutropenia, unspecified: Secondary | ICD-10-CM

## 2012-08-19 DIAGNOSIS — C50519 Malignant neoplasm of lower-outer quadrant of unspecified female breast: Secondary | ICD-10-CM

## 2012-08-19 MED ORDER — FILGRASTIM 480 MCG/0.8ML IJ SOLN
480.0000 ug | Freq: Once | INTRAMUSCULAR | Status: AC
  Administered 2012-08-19: 480 ug via SUBCUTANEOUS
  Filled 2012-08-19: qty 0.8

## 2012-08-19 NOTE — Progress Notes (Signed)
Location of Breast Cancer: right lower outer  Histology per Pathology Report: invasive ductal, DCIS w/calcifications  Receptor Status: ER(+), PR (+), Her2-neu (-)  Did patient present with symptoms (if so, please note symptoms) or was this found on screening mammography?: palpated   Past/Anticipated interventions by surgeon, if any: mastectomy, axiallry resection  Past/Anticipated interventions by medical oncology, if any: chemo began 03/13/12, AC x 4 cycles, taxol x 12 cycles, Abraxane weekly started on 06/27/12, next dose 08/22/12  Lymphedema issues, if any:  no  Pain issues, if any:   SAFETY ISSUES:  Prior radiation? no  Pacemaker/ICD? no  Possible current pregnancy? no  Is the patient on methotrexate? no  Current Complaints / other details:

## 2012-08-20 ENCOUNTER — Ambulatory Visit (HOSPITAL_BASED_OUTPATIENT_CLINIC_OR_DEPARTMENT_OTHER): Payer: BC Managed Care – PPO

## 2012-08-20 ENCOUNTER — Ambulatory Visit
Admission: RE | Admit: 2012-08-20 | Discharge: 2012-08-20 | Disposition: A | Discharge status: Home / Self Care | Payer: BC Managed Care – PPO | Source: Ambulatory Visit | Attending: Radiation Oncology | Admitting: Radiation Oncology

## 2012-08-20 ENCOUNTER — Other Ambulatory Visit: Payer: Self-pay | Admitting: Oncology

## 2012-08-20 VITALS — BP 126/79 | HR 97 | Temp 97.5°F | Wt 161.1 lb

## 2012-08-20 VITALS — BP 136/84 | HR 90 | Temp 97.9°F

## 2012-08-20 DIAGNOSIS — Z9010 Acquired absence of unspecified breast and nipple: Secondary | ICD-10-CM | POA: Insufficient documentation

## 2012-08-20 DIAGNOSIS — Z79899 Other long term (current) drug therapy: Secondary | ICD-10-CM | POA: Insufficient documentation

## 2012-08-20 DIAGNOSIS — C50919 Malignant neoplasm of unspecified site of unspecified female breast: Secondary | ICD-10-CM | POA: Insufficient documentation

## 2012-08-20 DIAGNOSIS — G609 Hereditary and idiopathic neuropathy, unspecified: Secondary | ICD-10-CM | POA: Insufficient documentation

## 2012-08-20 DIAGNOSIS — D709 Neutropenia, unspecified: Secondary | ICD-10-CM

## 2012-08-20 DIAGNOSIS — C50519 Malignant neoplasm of lower-outer quadrant of unspecified female breast: Secondary | ICD-10-CM

## 2012-08-20 DIAGNOSIS — C50511 Malignant neoplasm of lower-outer quadrant of right female breast: Secondary | ICD-10-CM

## 2012-08-20 DIAGNOSIS — T451X5A Adverse effect of antineoplastic and immunosuppressive drugs, initial encounter: Secondary | ICD-10-CM | POA: Insufficient documentation

## 2012-08-20 DIAGNOSIS — Z170 Estrogen receptor positive status [ER+]: Secondary | ICD-10-CM | POA: Insufficient documentation

## 2012-08-20 DIAGNOSIS — D702 Other drug-induced agranulocytosis: Secondary | ICD-10-CM | POA: Insufficient documentation

## 2012-08-20 MED ORDER — FILGRASTIM 480 MCG/0.8ML IJ SOLN
480.0000 ug | Freq: Once | INTRAMUSCULAR | Status: AC
Start: 2012-08-20 — End: 2012-08-20
  Administered 2012-08-20: 480 ug via SUBCUTANEOUS
  Filled 2012-08-20: qty 0.8

## 2012-08-20 NOTE — Progress Notes (Signed)
Tammie [name] here today for assessment for Invasive ductal Carcinoma of the right breast. She is still receiving Taxol and Abraxane last dose on Friday 18 th of April and will complete on this Friday.  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   She grades her peripheral neuropathy as a level 4 on a pain scale of 0-10.  Accompanied by her daughter.

## 2012-08-20 NOTE — Progress Notes (Signed)
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  She has completed almost all of her chemotherapy. Her chemotherapy course has been complicated by neutropenia and neuropathy. She has her final cycle of chemotherapy scheduled on Friday. Her mastectomy was performed in October which showed one out of 16 lymph nodes positive and a 5 to centimeter primary tumor with an additional 0.9 cm satellite lesion. Margins were negative with 0.6 cm from the deep margin and greater than 1 cm from the remaining margins. The tumor was estrogen and progesterone receptor positive. She has unfortunately lost her job in the past week. She also feels as though the anxiety related to her diagnosis is increased. She is accompanied by her daughter today. She would like to proceed on with radiation as soon as possible.  Allergies:  Allergies  Allergen Reactions  . Food Other (See Comments)    Onions or chives (if raw, swelling of face/throat and hives.  If cooked, GI "issues.")    Medications:  Current Outpatient Prescriptions  Medication Sig Dispense Refill  . atenolol-chlorthalidone (TENORETIC) 50-25 MG per tablet Take 1 tablet by mouth every morning.      . ferrous sulfate 325 (65 FE) MG tablet Take 325 mg by mouth 3 (three) times daily with meals.        . gabapentin (NEURONTIN) 300 MG capsule Take 1 capsule (300 mg total) by mouth 3 (three) times daily.  90 capsule  6  . HYDROcodone-acetaminophen (NORCO/VICODIN) 5-325 MG per tablet Take 1 tablet by mouth every 4 (four) hours as needed. For pain  30 tablet  0  . levocetirizine (XYZAL) 5 MG tablet Take 5 mg by mouth every evening.      . lidocaine-prilocaine (EMLA) cream Apply  topically as needed.  30 g  10  . LORazepam (ATIVAN) 0.5 MG tablet Take 1 tablet (0.5 mg total) by mouth every 6 (six) hours as needed (Nausea or vomiting).  30 tablet  0  . omeprazole (PRILOSEC) 40 MG capsule Take 1 capsule (40 mg total) by mouth daily.  30 capsule  3  . ondansetron (ZOFRAN ODT) 8 MG disintegrating tablet Take 1 tablet (8 mg total) by mouth every 8 (eight) hours as needed for nausea.  20 tablet  0  . rizatriptan (MAXALT) 10 MG tablet Take 10 mg by mouth as needed. May repeat in 2 hours if needed      . senna (SENOKOT) 8.6 MG tablet Take 1 tablet by mouth daily.      Marland Kitchen topiramate (TOPAMAX) 25 MG tablet Take 50 mg by mouth at bedtime.      Marland Kitchen UNABLE TO FIND Med Name:  Cranial Prothesis  1 each  0  . Zolpidem Tartrate (AMBIEN PO) Take 1 capsule by mouth as needed.       No current facility-administered medications for this encounter.    Physical Exam:  Filed Vitals:   08/20/12 1530  BP: 126/79  Pulse: 97  Temp: 97.5 F (36.4 C)   she is status post mastectomy on the right. She has 5 out of 5 strength in her bilateral upper extremities.  IMPRESSION: Tammie [name] is a 42 y.o. female status post mastectomy and axillary lymph node dissection  for a T2 N1 invasive ductal carcinoma of the right breast  PLAN:  I spoke with the patient today regarding the indications for postmastectomy radiation. She has a 5 cm tumor in a single node-positive. Despite the fact that she is hormone receptor positive she has both of these risk factors as well as her young age. For that reason I think she has about a 15-20% chance of local recurrence. I recommended radiation to the chest wall and supraclavicular lymph nodes. We discussed simulation next week and then starting her treatment in early May. We discussed 6 weeks of treatment as an outpatient. We discussed the possible side effects of treatment including but not limited to skin redness darkening and fatigue. We discussed increased complications related  to reconstruction and the necessity of autologous tissue reconstruction. I have recommended our survivorship class to her. I will also give her information regarding our after breast cancer class. She has signed informed consent and agree to proceed forward.    Lurline Hare, MD

## 2012-08-21 ENCOUNTER — Telehealth: Payer: Self-pay | Admitting: Medical Oncology

## 2012-08-21 NOTE — Telephone Encounter (Signed)
Patient called stating that she is receiving tx tomorrow but will be out of town d/t "her mother took a fall" and she needs to be with her, patient will not be able to come in for the neupogen injections and is asking if she could get a prescription/or the shots to take with her. Reviewed with Augustin Schooling, NP and this is ok, but would need to check with her insurance wether it is covered. Spoke with [facility] out patient pharmacy and according to them pt's insurance covers through "specialty pharmacy only." Spoke with managed care, patient has a neupogen credit card but since her insurance is covering 100% she would not be able to use it without an EOB and advised patient to call her insurance and explain her situation to see if they would cover the injections through our [facility] outpatient pharamacy d/t her situation. Patient stated she will call her insurance and let office know.

## 2012-08-22 ENCOUNTER — Other Ambulatory Visit (HOSPITAL_BASED_OUTPATIENT_CLINIC_OR_DEPARTMENT_OTHER): Payer: BC Managed Care – PPO | Admitting: Lab

## 2012-08-22 ENCOUNTER — Ambulatory Visit (HOSPITAL_BASED_OUTPATIENT_CLINIC_OR_DEPARTMENT_OTHER): Payer: BC Managed Care – PPO | Admitting: Adult Health

## 2012-08-22 ENCOUNTER — Ambulatory Visit (HOSPITAL_BASED_OUTPATIENT_CLINIC_OR_DEPARTMENT_OTHER): Payer: BC Managed Care – PPO

## 2012-08-22 ENCOUNTER — Encounter: Payer: Self-pay | Admitting: Adult Health

## 2012-08-22 ENCOUNTER — Telehealth: Payer: Self-pay | Admitting: Oncology

## 2012-08-22 VITALS — BP 126/76 | HR 74 | Temp 98.7°F | Resp 20 | Ht 66.0 in | Wt 161.9 lb

## 2012-08-22 DIAGNOSIS — M7989 Other specified soft tissue disorders: Secondary | ICD-10-CM

## 2012-08-22 DIAGNOSIS — C773 Secondary and unspecified malignant neoplasm of axilla and upper limb lymph nodes: Secondary | ICD-10-CM

## 2012-08-22 DIAGNOSIS — I890 Lymphedema, not elsewhere classified: Secondary | ICD-10-CM

## 2012-08-22 DIAGNOSIS — Z5111 Encounter for antineoplastic chemotherapy: Secondary | ICD-10-CM

## 2012-08-22 DIAGNOSIS — C50511 Malignant neoplasm of lower-outer quadrant of right female breast: Secondary | ICD-10-CM

## 2012-08-22 DIAGNOSIS — C50519 Malignant neoplasm of lower-outer quadrant of unspecified female breast: Secondary | ICD-10-CM

## 2012-08-22 DIAGNOSIS — G589 Mononeuropathy, unspecified: Secondary | ICD-10-CM

## 2012-08-22 LAB — CBC WITH DIFFERENTIAL/PLATELET
BASO%: 0.2 % (ref 0.0–2.0)
HCT: 32.8 % — ABNORMAL LOW (ref 34.8–46.6)
LYMPH%: 11.6 % — ABNORMAL LOW (ref 14.0–49.7)
MCH: 23.9 pg — ABNORMAL LOW (ref 25.1–34.0)
MCHC: 31.1 g/dL — ABNORMAL LOW (ref 31.5–36.0)
MONO#: 1 10*3/uL — ABNORMAL HIGH (ref 0.1–0.9)
NEUT%: 81.7 % — ABNORMAL HIGH (ref 38.4–76.8)
Platelets: 292 10*3/uL (ref 145–400)
WBC: 16 10*3/uL — ABNORMAL HIGH (ref 3.9–10.3)

## 2012-08-22 LAB — COMPREHENSIVE METABOLIC PANEL (CC13)
AST: 25 U/L (ref 5–34)
BUN: 9 mg/dL (ref 7.0–26.0)
Calcium: 9 mg/dL (ref 8.4–10.4)
Chloride: 110 mEq/L — ABNORMAL HIGH (ref 98–107)
Creatinine: 0.7 mg/dL (ref 0.6–1.1)
Glucose: 127 mg/dl — ABNORMAL HIGH (ref 70–99)

## 2012-08-22 MED ORDER — SODIUM CHLORIDE 0.9 % IJ SOLN
10.0000 mL | INTRAMUSCULAR | Status: DC | PRN
  Administered 2012-08-22: 10 mL
  Filled 2012-08-22: qty 10

## 2012-08-22 MED ORDER — PACLITAXEL PROTEIN-BOUND CHEMO INJECTION 100 MG
100.0000 mg/m2 | Freq: Once | INTRAVENOUS | Status: AC
Start: 2012-08-22 — End: 2012-08-22
  Administered 2012-08-22: 175 mg via INTRAVENOUS
  Filled 2012-08-22: qty 35

## 2012-08-22 MED ORDER — ONDANSETRON 8 MG/50ML IVPB (CHCC)
8.0000 mg | Freq: Once | INTRAVENOUS | Status: AC
  Administered 2012-08-22: 8 mg via INTRAVENOUS

## 2012-08-22 MED ORDER — HEPARIN SOD (PORK) LOCK FLUSH 100 UNIT/ML IV SOLN
500.0000 [IU] | Freq: Once | INTRAVENOUS | Status: AC | PRN
  Administered 2012-08-22: 500 [IU]
  Filled 2012-08-22: qty 5

## 2012-08-22 MED ORDER — SODIUM CHLORIDE 0.9 % IV SOLN
Freq: Once | INTRAVENOUS | Status: AC
  Administered 2012-08-22: 10:00:00 via INTRAVENOUS

## 2012-08-22 MED ORDER — DEXAMETHASONE SODIUM PHOSPHATE 10 MG/ML IJ SOLN
10.0000 mg | Freq: Once | INTRAMUSCULAR | Status: AC
  Administered 2012-08-22: 10 mg via INTRAVENOUS
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
## 2012-08-22 NOTE — Patient Instructions (Signed)
Doing well. Proceed with chemotherapy.  No neupogen after this cycle.  Start taking Cipro BID on Sunday.  Please call us if you have any questions or concerns.    We will see you back next week.

## 2012-08-22 NOTE — Patient Instructions (Addendum)

## 2012-08-25 ENCOUNTER — Ambulatory Visit: Payer: PRIVATE HEALTH INSURANCE | Attending: Oncology | Admitting: Physical Therapy

## 2012-08-25 DIAGNOSIS — Z9010 Acquired absence of unspecified breast and nipple: Secondary | ICD-10-CM | POA: Insufficient documentation

## 2012-08-25 DIAGNOSIS — C50919 Malignant neoplasm of unspecified site of unspecified female breast: Secondary | ICD-10-CM | POA: Insufficient documentation

## 2012-08-25 DIAGNOSIS — IMO0001 Reserved for inherently not codable concepts without codable children: Secondary | ICD-10-CM | POA: Insufficient documentation

## 2012-08-25 DIAGNOSIS — M24519 Contracture, unspecified shoulder: Secondary | ICD-10-CM | POA: Insufficient documentation

## 2012-08-25 DIAGNOSIS — R29898 Other symptoms and signs involving the musculoskeletal system: Secondary | ICD-10-CM | POA: Insufficient documentation

## 2012-08-25 DIAGNOSIS — R269 Unspecified abnormalities of gait and mobility: Secondary | ICD-10-CM | POA: Insufficient documentation

## 2012-08-26 ENCOUNTER — Ambulatory Visit
Admission: RE | Admit: 2012-08-26 | Discharge: 2012-08-26 | Disposition: A | Discharge status: Home / Self Care | Payer: BC Managed Care – PPO | Source: Ambulatory Visit | Attending: Radiation Oncology | Admitting: Radiation Oncology

## 2012-08-26 DIAGNOSIS — Z510 Encounter for antineoplastic radiation therapy: Secondary | ICD-10-CM | POA: Insufficient documentation

## 2012-08-26 DIAGNOSIS — C50519 Malignant neoplasm of lower-outer quadrant of unspecified female breast: Secondary | ICD-10-CM | POA: Insufficient documentation

## 2012-08-26 DIAGNOSIS — R5383 Other fatigue: Secondary | ICD-10-CM | POA: Insufficient documentation

## 2012-08-26 DIAGNOSIS — Y842 Radiological procedure and radiotherapy as the cause of abnormal reaction of the patient, or of later complication, without mention of misadventure at the time of the procedure: Secondary | ICD-10-CM | POA: Insufficient documentation

## 2012-08-26 DIAGNOSIS — C50511 Malignant neoplasm of lower-outer quadrant of right female breast: Secondary | ICD-10-CM

## 2012-08-26 DIAGNOSIS — L988 Other specified disorders of the skin and subcutaneous tissue: Secondary | ICD-10-CM | POA: Insufficient documentation

## 2012-08-26 DIAGNOSIS — R071 Chest pain on breathing: Secondary | ICD-10-CM | POA: Insufficient documentation

## 2012-08-26 DIAGNOSIS — R5381 Other malaise: Secondary | ICD-10-CM | POA: Insufficient documentation

## 2012-08-26 NOTE — Progress Notes (Signed)
Name: Tammie [name]   MRN: [id number]  Date:  08/26/2012  DOB: [birth year]  Status:outpatient    DIAGNOSIS: Breast cancer.  CONSENT VERIFIED: yes   SET UP: Patient is setup supine   IMMOBILIZATION:  The following immobilization was used:Custom Moldable Pillow, breast board.   NARRATIVE: [name] was brought to the CT Simulation planning suite.  Identity was confirmed.  All relevant records and images related to the planned course of therapy were reviewed.  Then, the patient was positioned in a stable reproducible clinical set-up for radiation therapy.  Wires were placed to delineate the clinical extent of breast tissue. A wire was placed on the scar as well.  CT images were obtained.  An isocenter was placed. Skin markings were placed.  The CT images were loaded into the planning software where the target and avoidance structures were contoured.  The radiation prescription was entered and confirmed. The patient was discharged in stable condition and tolerated simulation well.    TREATMENT PLANNING NOTE:  Treatment planning then occurred. I have requested : MLC's, isodose plan, basic dose calculation  I personally designed and supervised the construction of 4 medically necessary complex treatment devices for the protection of critical normal structures including the lungs and contralateral breast as well as the immobilization device which is necessary for set up certainty.

## 2012-08-29 ENCOUNTER — Encounter: Payer: Self-pay | Admitting: Oncology

## 2012-08-29 ENCOUNTER — Ambulatory Visit (HOSPITAL_BASED_OUTPATIENT_CLINIC_OR_DEPARTMENT_OTHER): Payer: BC Managed Care – PPO | Admitting: Oncology

## 2012-08-29 ENCOUNTER — Telehealth: Payer: Self-pay | Admitting: Oncology

## 2012-08-29 ENCOUNTER — Other Ambulatory Visit (HOSPITAL_BASED_OUTPATIENT_CLINIC_OR_DEPARTMENT_OTHER): Payer: BC Managed Care – PPO | Admitting: Lab

## 2012-08-29 VITALS — BP 131/83 | HR 72 | Temp 98.1°F | Resp 20 | Ht 66.0 in | Wt 160.0 lb

## 2012-08-29 DIAGNOSIS — G609 Hereditary and idiopathic neuropathy, unspecified: Secondary | ICD-10-CM

## 2012-08-29 DIAGNOSIS — C50519 Malignant neoplasm of lower-outer quadrant of unspecified female breast: Secondary | ICD-10-CM

## 2012-08-29 DIAGNOSIS — C773 Secondary and unspecified malignant neoplasm of axilla and upper limb lymph nodes: Secondary | ICD-10-CM

## 2012-08-29 DIAGNOSIS — C50511 Malignant neoplasm of lower-outer quadrant of right female breast: Secondary | ICD-10-CM

## 2012-08-29 DIAGNOSIS — D702 Other drug-induced agranulocytosis: Secondary | ICD-10-CM

## 2012-08-29 LAB — CBC WITH DIFFERENTIAL/PLATELET
BASO%: 1.1 % (ref 0.0–2.0)
EOS%: 1.1 % (ref 0.0–7.0)
HGB: 10.5 g/dL — ABNORMAL LOW (ref 11.6–15.9)
MCH: 23.8 pg — ABNORMAL LOW (ref 25.1–34.0)
MCHC: 30.7 g/dL — ABNORMAL LOW (ref 31.5–36.0)
RDW: 19.8 % — ABNORMAL HIGH (ref 11.2–14.5)
lymph#: 1.2 10*3/uL (ref 0.9–3.3)
nRBC: 0 % (ref 0–0)

## 2012-08-29 MED ORDER — CIPROFLOXACIN HCL 500 MG PO TABS: 500.0000 mg | ORAL_TABLET | Freq: Two times a day (BID) | ORAL | Status: DC
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
## 2012-08-29 NOTE — Patient Instructions (Addendum)
Proceed with radiation  tke cipro twice a day for 7 days.  I will see you back in June

## 2012-09-02 ENCOUNTER — Ambulatory Visit: Payer: BC Managed Care – PPO

## 2012-09-02 ENCOUNTER — Ambulatory Visit
Admission: RE | Admit: 2012-09-02 | Discharge: 2012-09-02 | Disposition: A | Discharge status: Home / Self Care | Payer: BC Managed Care – PPO | Source: Ambulatory Visit | Attending: Radiation Oncology | Admitting: Radiation Oncology

## 2012-09-02 ENCOUNTER — Encounter: Payer: Self-pay | Admitting: Radiation Oncology

## 2012-09-03 ENCOUNTER — Ambulatory Visit
Admission: RE | Admit: 2012-09-03 | Discharge: 2012-09-03 | Disposition: A | Discharge status: Home / Self Care | Payer: BC Managed Care – PPO | Source: Ambulatory Visit | Attending: Radiation Oncology | Admitting: Radiation Oncology

## 2012-09-03 DIAGNOSIS — C50511 Malignant neoplasm of lower-outer quadrant of right female breast: Secondary | ICD-10-CM

## 2012-09-03 MED ORDER — ALRA NON-METALLIC DEODORANT (RAD-ONC)
1.0000 "application " | Freq: Once | TOPICAL | Status: AC
  Administered 2012-09-03: 1 via TOPICAL

## 2012-09-03 MED ORDER — RADIAPLEXRX EX GEL
Freq: Once | CUTANEOUS | Status: AC
  Administered 2012-09-03: 17:00:00 via TOPICAL

## 2012-09-03 NOTE — Progress Notes (Signed)
Tammie [name] here for post sim education.  She was given the Radiation Therapy and You book and was educated on the potential side effects of radiation including skin changes and fatigue.  She was also instructed to follow a high protein diet.  She was given Alra Deoderant and Radiaplex gel.  She was instructed to apply the gel twice a day with the first application at least 4 hours before her treatment time and the second at bedtime.  She was advised to contact nursing with any questions or concerns.

## 2012-09-04 ENCOUNTER — Ambulatory Visit
Admission: RE | Admit: 2012-09-04 | Discharge: 2012-09-04 | Disposition: A | Discharge status: Home / Self Care | Payer: BC Managed Care – PPO | Source: Ambulatory Visit | Attending: Radiation Oncology | Admitting: Radiation Oncology

## 2012-09-05 ENCOUNTER — Other Ambulatory Visit: Payer: PRIVATE HEALTH INSURANCE | Admitting: Lab

## 2012-09-05 ENCOUNTER — Ambulatory Visit
Admission: RE | Admit: 2012-09-05 | Discharge: 2012-09-05 | Disposition: A | Discharge status: Home / Self Care | Payer: BC Managed Care – PPO | Source: Ambulatory Visit | Attending: Radiation Oncology | Admitting: Radiation Oncology

## 2012-09-08 ENCOUNTER — Ambulatory Visit
Admission: RE | Admit: 2012-09-08 | Discharge: 2012-09-08 | Disposition: A | Discharge status: Home / Self Care | Payer: BC Managed Care – PPO | Source: Ambulatory Visit | Attending: Radiation Oncology | Admitting: Radiation Oncology

## 2012-09-09 ENCOUNTER — Ambulatory Visit
Admission: RE | Admit: 2012-09-09 | Discharge: 2012-09-09 | Disposition: A | Discharge status: Home / Self Care | Payer: BC Managed Care – PPO | Source: Ambulatory Visit | Attending: Radiation Oncology | Admitting: Radiation Oncology

## 2012-09-09 DIAGNOSIS — C50511 Malignant neoplasm of lower-outer quadrant of right female breast: Secondary | ICD-10-CM

## 2012-09-09 NOTE — Progress Notes (Signed)
Weekly rad tx rt cwall 5 txs done, alert,oriented x3, no skin changes, using radiaplex 2x   Daily, nslight stinging occasionally "that's minor though", good appaetite, some fatigue 3:51 PM

## 2012-09-09 NOTE — Progress Notes (Signed)
Weekly Management Note Current Dose:  9 Gy  Projected Dose:45  Gy   Narrative:  The patient presents for routine under treatment assessment.  CBCT/MVCT images/Port film x-rays were reviewed.  The chart was checked. No skin changes. Using radiaplex.  Physical Findings: Weight:  . Unchanged  Impression:  The patient is tolerating radiation.  Plan:  Continue treatment as planned.

## 2012-09-10 ENCOUNTER — Ambulatory Visit
Admission: RE | Admit: 2012-09-10 | Discharge: 2012-09-10 | Disposition: A | Discharge status: Home / Self Care | Payer: BC Managed Care – PPO | Source: Ambulatory Visit | Attending: Radiation Oncology | Admitting: Radiation Oncology

## 2012-09-11 ENCOUNTER — Ambulatory Visit
Admission: RE | Admit: 2012-09-11 | Discharge: 2012-09-11 | Disposition: A | Discharge status: Home / Self Care | Payer: BC Managed Care – PPO | Source: Ambulatory Visit | Attending: Radiation Oncology | Admitting: Radiation Oncology

## 2012-09-12 ENCOUNTER — Ambulatory Visit
Admission: RE | Admit: 2012-09-12 | Discharge: 2012-09-12 | Disposition: A | Discharge status: Home / Self Care | Payer: BC Managed Care – PPO | Source: Ambulatory Visit | Attending: Radiation Oncology | Admitting: Radiation Oncology

## 2012-09-12 ENCOUNTER — Encounter: Payer: Self-pay | Admitting: Radiation Oncology

## 2012-09-12 VITALS — BP 144/76 | HR 76 | Temp 97.4°F | Resp 20 | Wt 164.2 lb

## 2012-09-12 DIAGNOSIS — C50511 Malignant neoplasm of lower-outer quadrant of right female breast: Secondary | ICD-10-CM

## 2012-09-12 MED ORDER — RADIAPLEXRX EX GEL
Freq: Once | CUTANEOUS | Status: AC
  Administered 2012-09-12: 16:00:00 via TOPICAL

## 2012-09-12 NOTE — Progress Notes (Signed)
Weekly rad txs rt cwall 8 txs completed, alert,oriented x3, uses radiaplex gel bid, slight fatigue,  Slight tanning, occasional stinging eating well, taking naps  After treatments, more fatigue, , wakes up at 2am even with Ativan

## 2012-09-12 NOTE — Progress Notes (Signed)
Weekly Management Note Current Dose: 14.4  Gy  Projected Dose: 60.4 Gy   Narrative:  The patient presents for routine under treatment assessment.  CBCT/MVCT images/Port film x-rays were reviewed.  The chart was checked. Doing well. Would like to know why we are treating supraclav and pictures of her plan and xrays. Also questions about reconstruction options.   Physical Findings: Weight: 164 lb 3.2 oz (74.481 kg). Slightly dark right chest wall.  Impression:  The patient is tolerating radiation.  Plan:  Continue treatment as planned. Continue radiaplex.Plans reviewed will print out patient plans and copy of xrays. Discussed plastic surgery and possible complications. Need for tram/lat flap.  Ok to get second opinion.

## 2012-09-15 ENCOUNTER — Ambulatory Visit
Admission: RE | Admit: 2012-09-15 | Discharge: 2012-09-15 | Disposition: A | Discharge status: Home / Self Care | Payer: BC Managed Care – PPO | Source: Ambulatory Visit | Attending: Radiation Oncology | Admitting: Radiation Oncology

## 2012-09-16 ENCOUNTER — Ambulatory Visit
Admission: RE | Admit: 2012-09-16 | Discharge: 2012-09-16 | Disposition: A | Discharge status: Home / Self Care | Payer: BC Managed Care – PPO | Source: Ambulatory Visit | Attending: Radiation Oncology | Admitting: Radiation Oncology

## 2012-09-17 ENCOUNTER — Ambulatory Visit
Admission: RE | Admit: 2012-09-17 | Discharge: 2012-09-17 | Disposition: A | Discharge status: Home / Self Care | Payer: BC Managed Care – PPO | Source: Ambulatory Visit | Attending: Radiation Oncology | Admitting: Radiation Oncology

## 2012-09-18 ENCOUNTER — Ambulatory Visit
Admission: RE | Admit: 2012-09-18 | Discharge: 2012-09-18 | Disposition: A | Discharge status: Home / Self Care | Payer: BC Managed Care – PPO | Source: Ambulatory Visit | Attending: Radiation Oncology | Admitting: Radiation Oncology

## 2012-09-19 ENCOUNTER — Encounter: Payer: Self-pay | Admitting: Oncology

## 2012-09-19 ENCOUNTER — Other Ambulatory Visit: Payer: Self-pay | Admitting: *Deleted

## 2012-09-19 ENCOUNTER — Ambulatory Visit
Admission: RE | Admit: 2012-09-19 | Discharge: 2012-09-19 | Disposition: A | Discharge status: Home / Self Care | Payer: BC Managed Care – PPO | Source: Ambulatory Visit | Attending: Radiation Oncology | Admitting: Radiation Oncology

## 2012-09-19 ENCOUNTER — Other Ambulatory Visit (HOSPITAL_BASED_OUTPATIENT_CLINIC_OR_DEPARTMENT_OTHER): Payer: BC Managed Care – PPO | Admitting: Lab

## 2012-09-19 VITALS — BP 127/85 | HR 81 | Temp 97.9°F | Ht 66.0 in | Wt 161.4 lb

## 2012-09-19 DIAGNOSIS — C50919 Malignant neoplasm of unspecified site of unspecified female breast: Secondary | ICD-10-CM

## 2012-09-19 DIAGNOSIS — C50519 Malignant neoplasm of lower-outer quadrant of unspecified female breast: Secondary | ICD-10-CM

## 2012-09-19 DIAGNOSIS — C50511 Malignant neoplasm of lower-outer quadrant of right female breast: Secondary | ICD-10-CM

## 2012-09-19 LAB — CBC WITH DIFFERENTIAL/PLATELET
BASO%: 0.9 % (ref 0.0–2.0)
Basophils Absolute: 0 10e3/uL (ref 0.0–0.1)
EOS%: 2.3 % (ref 0.0–7.0)
Eosinophils Absolute: 0.1 10e3/uL (ref 0.0–0.5)
HCT: 33.7 % — ABNORMAL LOW (ref 34.8–46.6)
HGB: 10.7 g/dL — ABNORMAL LOW (ref 11.6–15.9)
LYMPH%: 38.1 % (ref 14.0–49.7)
MCH: 23 pg — ABNORMAL LOW (ref 25.1–34.0)
MCHC: 31.7 g/dL (ref 31.5–36.0)
MCV: 72.7 fL — ABNORMAL LOW (ref 79.5–101.0)
MONO#: 0.4 10e3/uL (ref 0.1–0.9)
MONO%: 17.8 % — ABNORMAL HIGH (ref 0.0–14.0)
NEUT#: 0.9 10e3/uL — ABNORMAL LOW (ref 1.5–6.5)
NEUT%: 40.9 % (ref 38.4–76.8)
Platelets: 180 10e3/uL (ref 145–400)
RBC: 4.63 10e6/uL (ref 3.70–5.45)
RDW: 21.7 % — ABNORMAL HIGH (ref 11.2–14.5)
WBC: 2.2 10e3/uL — ABNORMAL LOW (ref 3.9–10.3)
lymph#: 0.8 10e3/uL — ABNORMAL LOW (ref 0.9–3.3)

## 2012-09-19 MED ORDER — CIPROFLOXACIN HCL 500 MG PO TABS: 500.0000 mg | ORAL_TABLET | Freq: Two times a day (BID) | ORAL | Status: DC

## 2012-09-19 NOTE — Progress Notes (Signed)
Ms. Mcclaran here for [facility] under treat visit with her daughter.  She has had 16 ractions to her right chest.  She denies pain and nausea.  She is very fatigued.  The skin on her right chest and underarm are intact with some hyperpigmentation.  She is using radiaplex twice a day.  She is reporting some discomfort with her port a cath in her left neck and to the side of it.

## 2012-09-19 NOTE — Progress Notes (Signed)
Redone EPP. Change of lifestyle. She is no longer working. All documents are scanned in. 100%ind 09/19/12-03/22/13. She has already signed the 600.00 and 400.00 grant forms. New letter and card to be mailed.

## 2012-09-19 NOTE — Telephone Encounter (Signed)
Received call from patient stating she was supposed to come in for labs on 09/05/12 to recheck her CBC because her white cell was low on the 08/29/12.  She states she forgot about the appt. And just remembered.  She states she is still here.  Instructed her to come and I would make her an appt.  To check her CBC and to wait for the results.  Patient denies any fevers or sickness.  Per Dr.Khan per lab results patient needs antibiotics x 1 week and recheck in 1 week. Instructed her to call with any fevers or sickness.  Patient verbalized understanding.  Rx called into the [facility] outpatient pharmacy and pt. Aware of her lab appt. 5/30 at 245.

## 2012-09-19 NOTE — Progress Notes (Signed)
Weekly Management Note Current Dose: 23.4  Gy  Projected Dose: 60.4 Gy   Narrative:  The patient presents for routine under treatment assessment.  CBCT/MVCT images/Port film x-rays were reviewed.  The chart was checked. Doing well. No pain except for portacath. Some fatigue.   Physical Findings: Weight: 161 lb 6.4 oz (73.211 kg). Slightly dark right chest wall.  Impression:  The patient is tolerating radiation.  Plan:  Continue treatment as planned. Patient given copies of beams' eye view and isodose lines at her request. Continue radiaplex.

## 2012-09-23 ENCOUNTER — Ambulatory Visit
Admission: RE | Admit: 2012-09-23 | Discharge: 2012-09-23 | Disposition: A | Discharge status: Home / Self Care | Payer: BC Managed Care – PPO | Source: Ambulatory Visit | Attending: Radiation Oncology | Admitting: Radiation Oncology

## 2012-09-24 ENCOUNTER — Ambulatory Visit
Admission: RE | Admit: 2012-09-24 | Discharge: 2012-09-24 | Disposition: A | Discharge status: Home / Self Care | Payer: BC Managed Care – PPO | Source: Ambulatory Visit | Attending: Radiation Oncology | Admitting: Radiation Oncology

## 2012-09-25 ENCOUNTER — Ambulatory Visit
Admission: RE | Admit: 2012-09-25 | Discharge: 2012-09-25 | Disposition: A | Discharge status: Home / Self Care | Payer: BC Managed Care – PPO | Source: Ambulatory Visit | Attending: Radiation Oncology | Admitting: Radiation Oncology

## 2012-09-26 ENCOUNTER — Ambulatory Visit
Admission: RE | Admit: 2012-09-26 | Discharge: 2012-09-26 | Disposition: A | Discharge status: Home / Self Care | Payer: BC Managed Care – PPO | Source: Ambulatory Visit | Attending: Radiation Oncology | Admitting: Radiation Oncology

## 2012-09-26 ENCOUNTER — Other Ambulatory Visit (HOSPITAL_BASED_OUTPATIENT_CLINIC_OR_DEPARTMENT_OTHER): Payer: BC Managed Care – PPO

## 2012-09-26 DIAGNOSIS — C50511 Malignant neoplasm of lower-outer quadrant of right female breast: Secondary | ICD-10-CM

## 2012-09-26 DIAGNOSIS — C50519 Malignant neoplasm of lower-outer quadrant of unspecified female breast: Secondary | ICD-10-CM

## 2012-09-26 LAB — COMPREHENSIVE METABOLIC PANEL (CC13)
ALT: 28 U/L (ref 0–55)
BUN: 8.8 mg/dL (ref 7.0–26.0)
CO2: 27 mEq/L (ref 22–29)
Calcium: 9.3 mg/dL (ref 8.4–10.4)
Chloride: 107 mEq/L (ref 98–107)
Creatinine: 0.8 mg/dL (ref 0.6–1.1)

## 2012-09-26 LAB — CBC WITH DIFFERENTIAL/PLATELET
Basophils Absolute: 0 10*3/uL (ref 0.0–0.1)
Eosinophils Absolute: 0 10*3/uL (ref 0.0–0.5)
HCT: 36.3 % (ref 34.8–46.6)
HGB: 11.5 g/dL — ABNORMAL LOW (ref 11.6–15.9)
MONO#: 0.2 10*3/uL (ref 0.1–0.9)
NEUT%: 53.6 % (ref 38.4–76.8)
WBC: 1.8 10*3/uL — ABNORMAL LOW (ref 3.9–10.3)
lymph#: 0.5 10*3/uL — ABNORMAL LOW (ref 0.9–3.3)

## 2012-09-29 ENCOUNTER — Encounter: Payer: Self-pay | Admitting: Oncology

## 2012-09-29 ENCOUNTER — Ambulatory Visit
Admission: RE | Admit: 2012-09-29 | Discharge: 2012-09-29 | Disposition: A | Discharge status: Home / Self Care | Payer: BC Managed Care – PPO | Source: Ambulatory Visit | Attending: Radiation Oncology | Admitting: Radiation Oncology

## 2012-09-29 NOTE — Progress Notes (Signed)
Called patient to advise to bring in copy of rental agreement that shows what she pays monthly. I have w- 9 from landlord. I can submit to get rent paid once all in.

## 2012-09-30 ENCOUNTER — Encounter: Payer: Self-pay | Admitting: Radiation Oncology

## 2012-09-30 ENCOUNTER — Ambulatory Visit
Admission: RE | Admit: 2012-09-30 | Discharge: 2012-09-30 | Disposition: A | Discharge status: Home / Self Care | Payer: BC Managed Care – PPO | Source: Ambulatory Visit | Attending: Radiation Oncology | Admitting: Radiation Oncology

## 2012-09-30 VITALS — BP 120/78 | HR 91 | Temp 98.4°F | Resp 20 | Wt 163.1 lb

## 2012-09-30 DIAGNOSIS — C50511 Malignant neoplasm of lower-outer quadrant of right female breast: Secondary | ICD-10-CM

## 2012-09-30 NOTE — Progress Notes (Signed)
Weekly rad tx right chest wall, 19/33 compleetd so far,dark tanning, on chest, c/o tightness and pain in ribs when she lays on right side at night, not always,, eating well, using radiaplex gel bid ,fatigue slight, tired, took 2.5 h nap after rad tx yesterday 3:27 PM

## 2012-09-30 NOTE — Progress Notes (Signed)
Weekly Management Note Current Dose: 34.2  Gy  Projected Dose: 50.4 Gy   Narrative:  The patient presents for routine under treatment assessment.  CBCT/MVCT images/Port film x-rays were reviewed.  The chart was checked. Doing well. Some fatigue but still trying to get up and go. Slight discomfort on her right side.  Physical Findings: Weight: 163 lb 1.6 oz (73.982 kg). Minimal tanning on chest wall. Increased in axilla. Skin intact.  Impression:  The patient is tolerating radiation.  Plan:  Continue treatment as planned. Continue radiaplex. Discussed survivorship issues.

## 2012-10-01 ENCOUNTER — Ambulatory Visit
Admission: RE | Admit: 2012-10-01 | Discharge: 2012-10-01 | Disposition: A | Discharge status: Home / Self Care | Payer: BC Managed Care – PPO | Source: Ambulatory Visit | Attending: Radiation Oncology | Admitting: Radiation Oncology

## 2012-10-02 ENCOUNTER — Ambulatory Visit
Admission: RE | Admit: 2012-10-02 | Discharge: 2012-10-02 | Disposition: A | Discharge status: Home / Self Care | Payer: BC Managed Care – PPO | Source: Ambulatory Visit | Attending: Radiation Oncology | Admitting: Radiation Oncology

## 2012-10-03 ENCOUNTER — Ambulatory Visit
Admission: RE | Admit: 2012-10-03 | Discharge: 2012-10-03 | Disposition: A | Discharge status: Home / Self Care | Payer: BC Managed Care – PPO | Source: Ambulatory Visit | Attending: Radiation Oncology | Admitting: Radiation Oncology

## 2012-10-03 ENCOUNTER — Encounter: Payer: Self-pay | Admitting: Radiation Oncology

## 2012-10-06 ENCOUNTER — Encounter: Payer: Self-pay | Admitting: Specialist

## 2012-10-06 ENCOUNTER — Ambulatory Visit
Admission: RE | Admit: 2012-10-06 | Discharge: 2012-10-06 | Disposition: A | Discharge status: Home / Self Care | Payer: BC Managed Care – PPO | Source: Ambulatory Visit | Attending: Radiation Oncology | Admitting: Radiation Oncology

## 2012-10-06 NOTE — Progress Notes (Signed)
Met with patient for individual counseling session at 9am on 6/9.  Lenoard Aden Counseling Intern Haroldine Laws

## 2012-10-07 ENCOUNTER — Ambulatory Visit
Admission: RE | Admit: 2012-10-07 | Discharge: 2012-10-07 | Disposition: A | Discharge status: Home / Self Care | Payer: BC Managed Care – PPO | Source: Ambulatory Visit | Attending: Radiation Oncology | Admitting: Radiation Oncology

## 2012-10-07 ENCOUNTER — Encounter: Payer: Self-pay | Admitting: Radiation Oncology

## 2012-10-07 VITALS — BP 114/89 | HR 87 | Temp 98.0°F | Resp 20 | Wt 162.7 lb

## 2012-10-07 DIAGNOSIS — C50511 Malignant neoplasm of lower-outer quadrant of right female breast: Secondary | ICD-10-CM

## 2012-10-07 NOTE — Progress Notes (Signed)
[facility]    Radiation Oncology [address] [place]     Maryln Gottron, M.D. [place], [place] [address]               Billie Lade, M.D., Ph.D. Phone: [phone]      Molli Hazard A. Kathrynn Running, M.D. Fax: [phone]      Radene Gunning, M.D., Ph.D.         Lurline Hare, M.D.         Grayland Jack, M.D Weekly Treatment Management Note  Name: Tammie [name]     MRN: [id number]        CSN: [id number] Date: 10/07/2012      DOB: 01/22/1971  CC: [name], MD         Paulino Rily    Status: Outpatient  Diagnosis: The encounter diagnosis was Cancer of lower-outer quadrant of female breast, right.  Current Dose: 43.2 Gy  Current Fraction: 24  Planned Dose: 60.4 Gy  Narrative: Tammie [name] was seen today for weekly treatment management. The chart was checked and port films  were reviewed. She is starting to have some fatigue at this time. She is also noticed some itching in the treatment area.  Food  Current Outpatient Prescriptions  Medication Sig Dispense Refill  . atenolol-chlorthalidone (TENORETIC) 50-25 MG per tablet Take 1 tablet by mouth every morning.      . ciprofloxacin (CIPRO) 500 MG tablet Take 1 tablet (500 mg total) by mouth 2 (two) times daily.  14 tablet  1  . ferrous sulfate 325 (65 FE) MG tablet Take 325 mg by mouth 3 (three) times daily with meals.        . gabapentin (NEURONTIN) 300 MG capsule Take 1 capsule (300 mg total) by mouth 3 (three) times daily.  90 capsule  6  . hyaluronate sodium (RADIAPLEXRX) GEL Apply 1 application topically 2 (two) times daily. Apply to breast after rad txs daily and bedtime, Mon=Fri, ,and Sat and Sun, just not 4 hours prior to rad txs      . HYDROcodone-acetaminophen (NORCO/VICODIN) 5-325 MG per tablet Take 1 tablet by mouth every 4 (four) hours as needed. For pain  30 tablet  0  . levocetirizine (XYZAL) 5 MG tablet Take 5 mg by mouth every evening.      . lidocaine-prilocaine (EMLA) cream Apply topically as needed.   30 g  10  . LORazepam (ATIVAN) 0.5 MG tablet Take 1 tablet (0.5 mg total) by mouth every 6 (six) hours as needed (Nausea or vomiting).  30 tablet  0  . non-metallic deodorant (ALRA) MISC Apply 1 application topically daily. Apply after rad tx daily      . omeprazole (PRILOSEC) 40 MG capsule Take 1 capsule (40 mg total) by mouth daily.  30 capsule  3  . ondansetron (ZOFRAN ODT) 8 MG disintegrating tablet Take 1 tablet (8 mg total) by mouth every 8 (eight) hours as needed for nausea.  20 tablet  0  . rizatriptan (MAXALT) 10 MG tablet Take 10 mg by mouth as needed. May repeat in 2 hours if needed      . senna (SENOKOT) 8.6 MG tablet Take 1 tablet by mouth daily.      Marland Kitchen topiramate (TOPAMAX) 25 MG tablet Take 50 mg by mouth at bedtime.      Marland Kitchen UNABLE TO FIND Med Name:  Cranial Prothesis  1 each  0  . Zolpidem Tartrate (AMBIEN PO) Take 1 capsule by mouth as needed.  No current facility-administered medications for this encounter.   Labs:  Lab Results  Component Value Date   WBC 1.8* 09/26/2012   HGB 11.5* 09/26/2012   HCT 36.3 09/26/2012   MCV 72.7* 09/26/2012   PLT 193 09/26/2012   Lab Results  Component Value Date   CREATININE 0.8 09/26/2012   BUN 8.8 09/26/2012   NA 143 09/26/2012   K 4.0 09/26/2012   CL 107 09/26/2012   CO2 27 09/26/2012   Lab Results  Component Value Date   ALT 28 09/26/2012   AST 26 09/26/2012   BILITOT 0.43 09/26/2012    Physical Examination:  weight is 162 lb 11.2 oz (73.8 kg). Her oral temperature is 98 F (36.7 C). Her blood pressure is 114/89 and her pulse is 87. Her respiration is 20.    Wt Readings from Last 3 Encounters:  10/07/12 162 lb 11.2 oz (73.8 kg)  09/30/12 163 lb 1.6 oz (73.982 kg)  09/19/12 161 lb 6.4 oz (73.211 kg)    The right chest wall area shows hyperpigmentation changes and dry desquamation but no moist desquamation. Lungs - Normal respiratory effort, chest expands symmetrically. Lungs are clear to auscultation, no crackles or wheezes.   Heart has regular rhythm and rate  Abdomen is soft and non tender with normal bowel sounds  Assessment:  Patient tolerating treatments well except for issues as above  Plan: Continue treatment per original radiation prescription

## 2012-10-07 NOTE — Progress Notes (Addendum)
Weekly rad txs right chest wall 24/33 completd, hyperpigmentation, tanning, skin intact, using radiaplex bid, some itching, and tired ,still taking naps, 1.5h yesterday,  3:31 PM

## 2012-10-08 ENCOUNTER — Ambulatory Visit
Admission: RE | Admit: 2012-10-08 | Discharge: 2012-10-08 | Disposition: A | Discharge status: Home / Self Care | Payer: BC Managed Care – PPO | Source: Ambulatory Visit | Attending: Radiation Oncology | Admitting: Radiation Oncology

## 2012-10-09 ENCOUNTER — Ambulatory Visit
Admission: RE | Admit: 2012-10-09 | Discharge: 2012-10-09 | Disposition: A | Discharge status: Home / Self Care | Payer: BC Managed Care – PPO | Source: Ambulatory Visit | Attending: Radiation Oncology | Admitting: Radiation Oncology

## 2012-10-10 ENCOUNTER — Ambulatory Visit
Admission: RE | Admit: 2012-10-10 | Discharge: 2012-10-10 | Disposition: A | Discharge status: Home / Self Care | Payer: BC Managed Care – PPO | Source: Ambulatory Visit | Attending: Radiation Oncology | Admitting: Radiation Oncology

## 2012-10-13 ENCOUNTER — Ambulatory Visit
Admission: RE | Admit: 2012-10-13 | Discharge: 2012-10-13 | Disposition: A | Discharge status: Home / Self Care | Payer: BC Managed Care – PPO | Source: Ambulatory Visit | Attending: Radiation Oncology | Admitting: Radiation Oncology

## 2012-10-14 ENCOUNTER — Ambulatory Visit
Admission: RE | Admit: 2012-10-14 | Discharge: 2012-10-14 | Disposition: A | Discharge status: Home / Self Care | Payer: BC Managed Care – PPO | Source: Ambulatory Visit | Attending: Radiation Oncology | Admitting: Radiation Oncology

## 2012-10-14 DIAGNOSIS — C50511 Malignant neoplasm of lower-outer quadrant of right female breast: Secondary | ICD-10-CM

## 2012-10-14 NOTE — Progress Notes (Signed)
Weekly Management Note Current Dose:  52.4 Gy  Projected Dose: 60.4 Gy   Narrative:  The patient presents for routine under treatment assessment.  CBCT/MVCT images/Port film x-rays were reviewed.  The chart was checked. Doing well. No complaints except some soreness under her arm.  Verified position of her electron field (lateral aspect was cut off due to skin dose there)  Physical Findings: Dry desquamation over chest wall.  Impression:  The patient is tolerating radiation.  Plan:  Continue treatment as planned. Continue radiaplex.

## 2012-10-14 NOTE — Progress Notes (Signed)
Patient seen in back by MD not sent to nursing for assessment 3:37 PM

## 2012-10-15 ENCOUNTER — Ambulatory Visit
Admission: RE | Admit: 2012-10-15 | Discharge: 2012-10-15 | Disposition: A | Discharge status: Home / Self Care | Payer: BC Managed Care – PPO | Source: Ambulatory Visit | Attending: Radiation Oncology | Admitting: Radiation Oncology

## 2012-10-16 ENCOUNTER — Ambulatory Visit
Admission: RE | Admit: 2012-10-16 | Discharge: 2012-10-16 | Disposition: A | Discharge status: Home / Self Care | Payer: BC Managed Care – PPO | Source: Ambulatory Visit | Attending: Radiation Oncology | Admitting: Radiation Oncology

## 2012-10-17 ENCOUNTER — Ambulatory Visit
Admission: RE | Admit: 2012-10-17 | Discharge: 2012-10-17 | Disposition: A | Discharge status: Home / Self Care | Payer: BC Managed Care – PPO | Source: Ambulatory Visit | Attending: Radiation Oncology | Admitting: Radiation Oncology

## 2012-10-17 ENCOUNTER — Encounter: Payer: Self-pay | Admitting: Radiation Oncology

## 2012-10-17 VITALS — BP 134/86 | HR 88 | Temp 98.0°F | Resp 20 | Wt 163.9 lb

## 2012-10-17 DIAGNOSIS — M898X9 Other specified disorders of bone, unspecified site: Secondary | ICD-10-CM

## 2012-10-17 DIAGNOSIS — C50511 Malignant neoplasm of lower-outer quadrant of right female breast: Secondary | ICD-10-CM

## 2012-10-17 MED ORDER — HYDROCODONE-ACETAMINOPHEN 5-325 MG PO TABS: 1.0000 | ORAL_TABLET | ORAL | Status: DC | PRN

## 2012-10-17 NOTE — Progress Notes (Signed)
Weekly Management Note Current Dose: 58.4  Gy  Projected Dose: 60.4 Gy   Narrative:  The patient presents for routine under treatment assessment.  CBCT/MVCT images/Port film x-rays were reviewed.  The chart was checked. She has some right chest wall pain. This is keeping her up at night. She's taking ibuprofen and Tylenol which basically keeps it under control but wonders if she could have something stronger. She is looking forward to completing treatment on Monday.  Physical Findings: Weight: 163 lb 14.4 oz (74.345 kg). She has hyperpigmented skin over the entire right chest wall. There is no moist desquamation.  Impression:  The patient is tolerating radiation.  Plan:  Continue treatment as planned. Followup in one month. It lotion with vitamin E. was discussed. She hasn't been referred for survivorship class is. She knows to call with any questions in the interim.

## 2012-10-17 NOTE — Progress Notes (Signed)
Weekly rad txs 32/33 right chest wall very dark hyper pigmentation, dry desquamation under axilla and chest , starting to flaky peel, swelling under axilla , patient states"Nerve pain in chest, "iright side chest wall,ribs? has started to hurt a lot when barely touching skin ", , she does take advil prn, f/u appt card given , using radiaplex gel bid, a couple weeks once finished suggested lotion wioth vitamin e, interested in [facility]  Next support group, will e-mail Lenell Antu,  3:31 PM

## 2012-10-20 ENCOUNTER — Ambulatory Visit
Admission: RE | Admit: 2012-10-20 | Discharge: 2012-10-20 | Disposition: A | Discharge status: Home / Self Care | Payer: BC Managed Care – PPO | Source: Ambulatory Visit | Attending: Radiation Oncology | Admitting: Radiation Oncology

## 2012-10-20 ENCOUNTER — Encounter: Payer: Self-pay | Admitting: Specialist

## 2012-10-20 ENCOUNTER — Encounter: Payer: Self-pay | Admitting: Radiation Oncology

## 2012-10-20 NOTE — Progress Notes (Signed)
Individual counseling appointment with client on 6/17 at 2pm.  Lenoard Aden [facility] Intern

## 2012-10-22 NOTE — Progress Notes (Signed)
  Radiation Oncology         [phone] ________________________________  Name: Tammie [name] MRN: [id number]  Date: 09/02/2012  DOB: 11-05-1970  Simulation Verification Note  Status:  outpatient  NARRATIVE: The patient was brought to the treatment unit and placed in the planned treatment position. The clinical setup was verified. Then port films were obtained and uploaded to the radiation oncology medical record software.  The treatment beams were carefully compared against the planned radiation fields. The position location and shape of the radiation fields was reviewed. The targeted volume of tissue appears appropriately covered by the radiation beams. Organs at risk appear to be excluded as planned.  Based on my personal review, I approved the simulation verification. The patient's treatment will proceed as planned.  ------------------------------------------------  Lurline Hare, MD

## 2012-10-22 NOTE — Progress Notes (Signed)
Name: Tammie [name]   MRN: [id number]  Date:  10/03/2012    DOB: 01-29-1971  Status:outpatient    DIAGNOSIS: Breast cancer.  CONSENT VERIFIED: yes   SET UP: Patient is setup supine   IMMOBILIZATION:  The following immobilization was used:Custom Moldable Pillow, breast board.   NARRATIVE: [name] underwent complex simulation and treatment planning for her boost treatment today.  She will be treated to her scar with margin which I marked out clinically on the treatment machine to a total dose of 10 Gy at 2 Gy per fraction.  6 MeV electrons will be prescribed to the 90% Isodose line.   A block will be used for beam modification purposes.  A special port plan is requested.

## 2012-10-22 NOTE — Progress Notes (Signed)
  Radiation Oncology         [phone] ________________________________  Name: Tammie [name] MRN: [id number]  Date: 10/20/2012  DOB: Jan 13, 1971  End of Treatment Note  Diagnosis:   T3N1 Right breast cancer    Indication for treatment:  Curative       Radiation treatment dates:  09/03/2012-10/20/2012  Site/dose:    Right chest wall 50.4 Gray @ 1.8 Wallace Cullens per fraction x 28 fractions Right supraclavicular fossa / 45 Gy @1 .8 Gy per fraction x 25 fractions Right scar boost / 10 Gray at [employer] per fraction x 5 fractions  Beams/energy:  Opposed Tangents / 6 M photons LAO / 6 MV photons Enface/ 6 MeV electrons  Narrative: The patient tolerated radiation treatment relatively well.   She ended her treatment with right chest wall pain and dry desquamation and hyperpigmentation. She declined narcotics and is able to control her pain with ibuprofen and Tylenol.  Plan: The patient has completed radiation treatment. The patient will return to radiation oncology clinic for routine followup in one month. I advised them to call or return sooner if they have any questions or concerns related to their recovery or treatment.  ------------------------------------------------  Lurline Hare, MD

## 2012-10-23 ENCOUNTER — Other Ambulatory Visit (HOSPITAL_BASED_OUTPATIENT_CLINIC_OR_DEPARTMENT_OTHER): Payer: BC Managed Care – PPO | Admitting: Lab

## 2012-10-23 ENCOUNTER — Telehealth: Payer: Self-pay | Admitting: Oncology

## 2012-10-23 ENCOUNTER — Ambulatory Visit (HOSPITAL_BASED_OUTPATIENT_CLINIC_OR_DEPARTMENT_OTHER): Payer: BC Managed Care – PPO | Admitting: Oncology

## 2012-10-23 ENCOUNTER — Encounter: Payer: Self-pay | Admitting: Oncology

## 2012-10-23 VITALS — BP 144/87 | HR 87 | Temp 98.1°F | Resp 20 | Ht 66.0 in | Wt 162.9 lb

## 2012-10-23 DIAGNOSIS — C50511 Malignant neoplasm of lower-outer quadrant of right female breast: Secondary | ICD-10-CM

## 2012-10-23 DIAGNOSIS — C50519 Malignant neoplasm of lower-outer quadrant of unspecified female breast: Secondary | ICD-10-CM

## 2012-10-23 LAB — CBC WITH DIFFERENTIAL/PLATELET
BASO%: 0.7 % (ref 0.0–2.0)
HCT: 34.4 % — ABNORMAL LOW (ref 34.8–46.6)
MCHC: 32.1 g/dL (ref 31.5–36.0)
MONO#: 0.2 10*3/uL (ref 0.1–0.9)
NEUT%: 44.1 % (ref 38.4–76.8)
WBC: 1.5 10*3/uL — ABNORMAL LOW (ref 3.9–10.3)
lymph#: 0.6 10*3/uL — ABNORMAL LOW (ref 0.9–3.3)

## 2012-10-23 MED ORDER — TAMOXIFEN CITRATE 20 MG PO TABS: 20.0000 mg | ORAL_TABLET | Freq: Every day | ORAL | Status: AC

## 2012-10-23 MED ORDER — LORAZEPAM 0.5 MG PO TABS: 0.5000 mg | ORAL_TABLET | Freq: Four times a day (QID) | ORAL | Status: DC | PRN
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
## 2012-10-23 NOTE — Patient Instructions (Addendum)
We discussed tamoxifen 20 mg daily  CT scans prior to next visit  Port removal  I will see you back in 3 months  Tamoxifen oral tablet What is this medicine? TAMOXIFEN (ta MOX i fen) blocks the effects of estrogen. It is commonly used to treat breast cancer. It is also used to decrease the chance of breast cancer coming back in women who have received treatment for the disease. It may also help prevent breast cancer in women who have a high risk of developing breast cancer. This medicine may be used for other purposes; ask your health care provider or pharmacist if you have questions. What should I tell my health care provider before I take this medicine? They need to know if you have any of these conditions: -blood clots -blood disease -cataracts or impaired eyesight -endometriosis -high calcium levels -high cholesterol -irregular menstrual cycles -liver disease -stroke -uterine fibroids -an unusual or allergic reaction to tamoxifen, other medicines, foods, dyes, or preservatives -pregnant or trying to get pregnant -breast-feeding How should I use this medicine? Take this medicine by mouth with a glass of water. Follow the directions on the prescription label. You can take it with or without food. Take your medicine at regular intervals. Do not take your medicine more often than directed. Do not stop taking except on your doctor's advice. A special MedGuide will be given to you by the pharmacist with each prescription and refill. Be sure to read this information carefully each time. Talk to your pediatrician regarding the use of this medicine in children. While this drug may be prescribed for selected conditions, precautions do apply. Overdosage: If you think you have taken too much of this medicine contact a poison control center or emergency room at once. NOTE: This medicine is only for you. Do not share this medicine with others. What if I miss a dose? If you miss a dose, take  it as soon as you can. If it is almost time for your next dose, take only that dose. Do not take double or extra doses. What may interact with this medicine? -aminoglutethimide -bromocriptine -chemotherapy drugs -female hormones, like estrogens and birth control pills -letrozole -medroxyprogesterone -phenobarbital -rifampin -warfarin This list may not describe all possible interactions. Give your health care provider a list of all the medicines, herbs, non-prescription drugs, or dietary supplements you use. Also tell them if you smoke, drink alcohol, or use illegal drugs. Some items may interact with your medicine. What should I watch for while using this medicine? Visit your doctor or health care professional for regular checks on your progress. You will need regular pelvic exams, breast exams, and mammograms. If you are taking this medicine to reduce your risk of getting breast cancer, you should know that this medicine does not prevent all types of breast cancer. If breast cancer or other problems occur, there is no guarantee that it will be found at an early stage. Do not become pregnant while taking this medicine or for 2 months after stopping this medicine. Stop taking this medicine if you get pregnant or think you are pregnant and contact your doctor. This medicine may harm your unborn baby. Women who can possibly become pregnant should use birth control methods that do not use hormones during tamoxifen treatment and for 2 months after therapy has stopped. Talk with your health care provider for birth control advice. Do not breast feed while taking this medicine. What side effects may I notice from receiving this medicine? Side  effects that you should report to your doctor or health care professional as soon as possible: -changes in vision (blurred vision) -changes in your menstrual cycle -difficulty breathing or shortness of breath -difficulty walking or talking -new breast  lumps -numbness -pelvic pain or pressure -redness, blistering, peeling or loosening of the skin, including inside the mouth -skin rash or itching (hives) -sudden chest pain -swelling of lips, face, or tongue -swelling, pain or tenderness in your calf or leg -unusual bruising or bleeding -vaginal discharge that is bloody, brown, or rust -weakness -yellowing of the whites of the eyes or skin Side effects that usually do not require medical attention (report to your doctor or health care professional if they continue or are bothersome): -fatigue -hair loss, although uncommon and is usually mild -headache -hot flashes -impotence (in men) -nausea, vomiting (mild) -vaginal discharge (white or clear) This list may not describe all possible side effects. Call your doctor for medical advice about side effects. You may report side effects to FDA at 1-800-FDA-1088. Where should I keep my medicine? Keep out of the reach of children. Store at room temperature between 20 and 25 degrees C (68 and 77 degrees F). Protect from light. Keep container tightly closed. Throw away any unused medicine after the expiration date. NOTE: This sheet is a summary. It may not cover all possible information. If you have questions about this medicine, talk to your doctor, pharmacist, or health care provider.  2012, [employer]vier/[employer] Standard. (01/01/2008 12:01:56 PM)

## 2012-10-27 ENCOUNTER — Other Ambulatory Visit: Payer: Self-pay | Admitting: Emergency Medicine

## 2012-10-27 DIAGNOSIS — C50519 Malignant neoplasm of lower-outer quadrant of unspecified female breast: Secondary | ICD-10-CM

## 2012-10-29 ENCOUNTER — Telehealth: Payer: Self-pay | Admitting: *Deleted

## 2012-10-29 ENCOUNTER — Encounter: Payer: Self-pay | Admitting: Specialist

## 2012-10-29 NOTE — Progress Notes (Signed)
Individual counseling session with client on 7/2 at 2pm.  Lenoard Aden [facility] Counseling Intern

## 2012-10-29 NOTE — Telephone Encounter (Signed)
Pt came by to cancel her lab for 11/05/12 and to rs. gv appt for 11/07/12 @1pm ...td

## 2012-10-30 ENCOUNTER — Telehealth: Payer: Self-pay | Admitting: *Deleted

## 2012-10-30 NOTE — Telephone Encounter (Signed)
PT. STATES SHE JUST NOTICED THE QUARTER SIZE "MUSHY" LUMP."IT IS NEAR A VEIN." NO PAIN, REDNESS,SWELLING,OR DRAINAGE IN THIS AREA. PT. STATES A SMALLER LUMP CAME UP NEAR HER WRIST WHEN SHE WAS ON CHEMO. INSTRUCTED PT. TO ELEVATE HER ARM AND APPLY WARM MOIST COMPRESSES FOUR TIMES A DAY. IF ANY CHANGES AS STATED ABOVE OCCUR SHE SHOULD GO TO AN URGENT CARE OR TO THE EMERGENCY ROOM TO HAVE THIS AREA EVALUATED. PT. VOICES UNDERSTANDING. SHE WILL CALL Monday TO GIVE DR.KHAN'S NURSE AN UPDATE.

## 2012-11-05 ENCOUNTER — Other Ambulatory Visit: Payer: BC Managed Care – PPO | Admitting: Lab

## 2012-11-07 ENCOUNTER — Encounter: Payer: Self-pay | Admitting: Medical Oncology

## 2012-11-07 ENCOUNTER — Other Ambulatory Visit (HOSPITAL_BASED_OUTPATIENT_CLINIC_OR_DEPARTMENT_OTHER): Payer: BC Managed Care – PPO | Admitting: Lab

## 2012-11-07 ENCOUNTER — Other Ambulatory Visit: Payer: Self-pay | Admitting: Adult Health

## 2012-11-07 ENCOUNTER — Ambulatory Visit (HOSPITAL_BASED_OUTPATIENT_CLINIC_OR_DEPARTMENT_OTHER): Payer: BC Managed Care – PPO

## 2012-11-07 DIAGNOSIS — C50519 Malignant neoplasm of lower-outer quadrant of unspecified female breast: Secondary | ICD-10-CM

## 2012-11-07 DIAGNOSIS — D709 Neutropenia, unspecified: Secondary | ICD-10-CM

## 2012-11-07 LAB — CBC WITH DIFFERENTIAL/PLATELET
Basophils Absolute: 0 10*3/uL (ref 0.0–0.1)
Eosinophils Absolute: 0 10*3/uL (ref 0.0–0.5)
HCT: 36.8 % (ref 34.8–46.6)
HGB: 11.7 g/dL (ref 11.6–15.9)
MCH: 22.6 pg — ABNORMAL LOW (ref 25.1–34.0)
MONO#: 0.4 10*3/uL (ref 0.1–0.9)
NEUT#: 0.8 10*3/uL — ABNORMAL LOW (ref 1.5–6.5)
NEUT%: 36.4 % — ABNORMAL LOW (ref 38.4–76.8)
WBC: 2.2 10*3/uL — ABNORMAL LOW (ref 3.9–10.3)
lymph#: 1 10*3/uL (ref 0.9–3.3)

## 2012-11-07 MED ORDER — PEGFILGRASTIM INJECTION 6 MG/0.6ML
6.0000 mg | Freq: Once | SUBCUTANEOUS | Status: AC
  Administered 2012-11-07: 6 mg via SUBCUTANEOUS
  Filled 2012-11-07: qty 0.6

## 2012-11-07 MED ORDER — PEGFILGRASTIM INJECTION 6 MG/0.6ML: 6.0000 mg | Freq: Once | SUBCUTANEOUS | Status: DC

## 2012-11-07 NOTE — Addendum Note (Signed)
Addended by: Merlene Laughter B on: 11/07/2012 02:01 PM   Modules accepted: Orders

## 2012-11-08 ENCOUNTER — Other Ambulatory Visit: Payer: Self-pay

## 2012-11-08 ENCOUNTER — Encounter (HOSPITAL_COMMUNITY): Payer: Self-pay | Admitting: Emergency Medicine

## 2012-11-08 ENCOUNTER — Emergency Department (HOSPITAL_COMMUNITY)
Admission: EM | Admit: 2012-11-08 | Discharge: 2012-11-09 | Disposition: A | Discharge status: Home / Self Care | Payer: BC Managed Care – PPO | Attending: Emergency Medicine | Admitting: Emergency Medicine

## 2012-11-08 ENCOUNTER — Emergency Department (HOSPITAL_COMMUNITY): Payer: BC Managed Care – PPO

## 2012-11-08 DIAGNOSIS — Z9221 Personal history of antineoplastic chemotherapy: Secondary | ICD-10-CM | POA: Insufficient documentation

## 2012-11-08 DIAGNOSIS — G4700 Insomnia, unspecified: Secondary | ICD-10-CM | POA: Insufficient documentation

## 2012-11-08 DIAGNOSIS — Z923 Personal history of irradiation: Secondary | ICD-10-CM | POA: Insufficient documentation

## 2012-11-08 DIAGNOSIS — R079 Chest pain, unspecified: Secondary | ICD-10-CM

## 2012-11-08 DIAGNOSIS — D649 Anemia, unspecified: Secondary | ICD-10-CM | POA: Insufficient documentation

## 2012-11-08 DIAGNOSIS — I10 Essential (primary) hypertension: Secondary | ICD-10-CM | POA: Insufficient documentation

## 2012-11-08 DIAGNOSIS — Z8739 Personal history of other diseases of the musculoskeletal system and connective tissue: Secondary | ICD-10-CM | POA: Insufficient documentation

## 2012-11-08 DIAGNOSIS — R5381 Other malaise: Secondary | ICD-10-CM | POA: Insufficient documentation

## 2012-11-08 DIAGNOSIS — R0602 Shortness of breath: Secondary | ICD-10-CM | POA: Insufficient documentation

## 2012-11-08 DIAGNOSIS — G43909 Migraine, unspecified, not intractable, without status migrainosus: Secondary | ICD-10-CM | POA: Insufficient documentation

## 2012-11-08 DIAGNOSIS — Z853 Personal history of malignant neoplasm of breast: Secondary | ICD-10-CM | POA: Insufficient documentation

## 2012-11-08 DIAGNOSIS — Z87828 Personal history of other (healed) physical injury and trauma: Secondary | ICD-10-CM | POA: Insufficient documentation

## 2012-11-08 DIAGNOSIS — M542 Cervicalgia: Secondary | ICD-10-CM | POA: Insufficient documentation

## 2012-11-08 DIAGNOSIS — Z79899 Other long term (current) drug therapy: Secondary | ICD-10-CM | POA: Insufficient documentation

## 2012-11-08 DIAGNOSIS — R0789 Other chest pain: Secondary | ICD-10-CM | POA: Insufficient documentation

## 2012-11-08 DIAGNOSIS — R011 Cardiac murmur, unspecified: Secondary | ICD-10-CM | POA: Insufficient documentation

## 2012-11-08 LAB — COMPREHENSIVE METABOLIC PANEL
ALT: 42 U/L — ABNORMAL HIGH (ref 0–35)
CO2: 24 mEq/L (ref 19–32)
Calcium: 9.6 mg/dL (ref 8.4–10.5)
Chloride: 105 mEq/L (ref 96–112)
Creatinine, Ser: 0.79 mg/dL (ref 0.50–1.10)
GFR calc Af Amer: >90 mL/min (ref >90)
GFR calc non Af Amer: >90 mL/min (ref >90)
Glucose, Bld: 84 mg/dL (ref 70–99)
Total Bilirubin: 0.3 mg/dL (ref 0.3–1.2)

## 2012-11-08 LAB — POCT I-STAT TROPONIN I: Troponin i, poc: 0.01 ng/mL (ref 0.00–0.08)

## 2012-11-08 MED ORDER — LORAZEPAM 1 MG PO TABS
1.0000 mg | ORAL_TABLET | Freq: Once | ORAL | Status: AC
  Administered 2012-11-08: 1 mg via ORAL
  Filled 2012-11-08: qty 1

## 2012-11-08 NOTE — ED Notes (Signed)
Bed:WA05<BR> Expected date:<BR> Expected time:<BR> Means of arrival:<BR> Comments:<BR> EMS
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
## 2012-11-08 NOTE — ED Notes (Signed)
Patient states that she has a left sided chest Port implanted. The patient reports that she has pain to her left chest and neck. The patient reports that the pain is still there after a day and half

## 2012-11-09 ENCOUNTER — Ambulatory Visit (HOSPITAL_COMMUNITY)
Admission: RE | Admit: 2012-11-09 | Discharge: 2012-11-09 | Disposition: A | Discharge status: Home / Self Care | Payer: BC Managed Care – PPO | Source: Ambulatory Visit | Attending: Emergency Medicine | Admitting: Emergency Medicine

## 2012-11-09 DIAGNOSIS — M7989 Other specified soft tissue disorders: Secondary | ICD-10-CM

## 2012-11-09 DIAGNOSIS — R0602 Shortness of breath: Secondary | ICD-10-CM

## 2012-11-09 DIAGNOSIS — M542 Cervicalgia: Secondary | ICD-10-CM

## 2012-11-09 LAB — CBC WITH DIFFERENTIAL/PLATELET
Basophils Relative: 0 % (ref 0–1)
Eosinophils Absolute: 0 10*3/uL (ref 0.0–0.7)
HCT: 35.4 % — ABNORMAL LOW (ref 36.0–46.0)
Hemoglobin: 10.9 g/dL — ABNORMAL LOW (ref 12.0–15.0)
MCH: 22.2 pg — ABNORMAL LOW (ref 26.0–34.0)
MCHC: 30.8 g/dL (ref 30.0–36.0)
MCV: 72 fL — ABNORMAL LOW (ref 78.0–100.0)
Monocytes Absolute: 1 10*3/uL (ref 0.1–1.0)
Neutro Abs: 14.5 10*3/uL — ABNORMAL HIGH (ref 1.7–7.7)

## 2012-11-09 MED ORDER — ENOXAPARIN SODIUM 80 MG/0.8ML ~~LOC~~ SOLN
1.0000 mg/kg | Freq: Once | SUBCUTANEOUS | Status: AC
  Administered 2012-11-09: 70 mg via SUBCUTANEOUS
  Filled 2012-11-09: qty 0.8

## 2012-11-09 NOTE — Progress Notes (Signed)
VASCULAR LAB PRELIMINARY  PRELIMINARY  PRELIMINARY  PRELIMINARY  Left upper extremity venous Doppler completed.    Preliminary report:  There is no DVT or SVT noted in the left upper extremity.  Zayyan Mullen, RVT 11/09/2012, 10:37 AM

## 2012-11-10 NOTE — ED Provider Notes (Signed)
Medical screening examination/treatment/procedure(s) were performed by non-physician practitioner and as supervising physician I was immediately available for consultation/collaboration.   Leialoha Hanna L Therin Vetsch, MD 11/10/12 1539 

## 2012-11-11 ENCOUNTER — Other Ambulatory Visit (HOSPITAL_COMMUNITY): Payer: Self-pay | Admitting: Emergency Medicine

## 2012-11-11 ENCOUNTER — Other Ambulatory Visit: Payer: Self-pay | Admitting: Emergency Medicine

## 2012-11-11 DIAGNOSIS — M542 Cervicalgia: Secondary | ICD-10-CM

## 2012-11-11 DIAGNOSIS — R0602 Shortness of breath: Secondary | ICD-10-CM

## 2012-11-12 ENCOUNTER — Telehealth: Payer: Self-pay | Admitting: *Deleted

## 2012-11-12 NOTE — Telephone Encounter (Signed)
appts made and printed...td 

## 2012-11-13 ENCOUNTER — Encounter: Payer: Self-pay | Admitting: Adult Health

## 2012-11-13 ENCOUNTER — Telehealth: Payer: Self-pay | Admitting: *Deleted

## 2012-11-13 ENCOUNTER — Other Ambulatory Visit (HOSPITAL_BASED_OUTPATIENT_CLINIC_OR_DEPARTMENT_OTHER): Payer: BC Managed Care – PPO | Admitting: Lab

## 2012-11-13 ENCOUNTER — Other Ambulatory Visit: Payer: Self-pay

## 2012-11-13 ENCOUNTER — Ambulatory Visit (HOSPITAL_BASED_OUTPATIENT_CLINIC_OR_DEPARTMENT_OTHER): Payer: BC Managed Care – PPO | Admitting: Adult Health

## 2012-11-13 ENCOUNTER — Encounter: Payer: Self-pay | Admitting: Specialist

## 2012-11-13 VITALS — BP 132/88 | HR 98 | Temp 98.2°F | Resp 20 | Ht 66.0 in | Wt 159.2 lb

## 2012-11-13 DIAGNOSIS — C50519 Malignant neoplasm of lower-outer quadrant of unspecified female breast: Secondary | ICD-10-CM

## 2012-11-13 DIAGNOSIS — C50511 Malignant neoplasm of lower-outer quadrant of right female breast: Secondary | ICD-10-CM

## 2012-11-13 LAB — CBC WITH DIFFERENTIAL/PLATELET
BASO%: 0.4 % (ref 0.0–2.0)
Basophils Absolute: 0.1 10*3/uL (ref 0.0–0.1)
EOS%: 0.2 % (ref 0.0–7.0)
Eosinophils Absolute: 0 10*3/uL (ref 0.0–0.5)
HCT: 35.5 % (ref 34.8–46.6)
HGB: 11.3 g/dL — ABNORMAL LOW (ref 11.6–15.9)
LYMPH%: 10.6 % — ABNORMAL LOW (ref 14.0–49.7)
MCH: 22.7 pg — ABNORMAL LOW (ref 25.1–34.0)
MCHC: 31.7 g/dL (ref 31.5–36.0)
MCV: 71.8 fL — ABNORMAL LOW (ref 79.5–101.0)
MONO#: 0.7 10*3/uL (ref 0.1–0.9)
MONO%: 4.6 % (ref 0.0–14.0)
NEUT#: 13.1 10*3/uL — ABNORMAL HIGH (ref 1.5–6.5)
NEUT%: 84.2 % — ABNORMAL HIGH (ref 38.4–76.8)
Platelets: 177 10*3/uL (ref 145–400)
RBC: 4.95 10*6/uL (ref 3.70–5.45)
RDW: 20.8 % — ABNORMAL HIGH (ref 11.2–14.5)
WBC: 15.5 10*3/uL — ABNORMAL HIGH (ref 3.9–10.3)
lymph#: 1.6 10*3/uL (ref 0.9–3.3)

## 2012-11-13 LAB — COMPREHENSIVE METABOLIC PANEL (CC13)
ALT: 63 U/L — ABNORMAL HIGH (ref 0–55)
AST: 48 U/L — ABNORMAL HIGH (ref 5–34)
CO2: 24 mEq/L (ref 22–29)
Calcium: 9.2 mg/dL (ref 8.4–10.4)
Chloride: 110 mEq/L — ABNORMAL HIGH (ref 98–109)
Potassium: 3.5 mEq/L (ref 3.5–5.1)
Sodium: 143 mEq/L (ref 136–145)
Total Protein: 7.2 g/dL (ref 6.4–8.3)

## 2012-11-13 NOTE — Telephone Encounter (Signed)
appts made and printed. Pt is aware that cs will give her a call...td

## 2012-11-13 NOTE — Progress Notes (Signed)
Individual counseling session 7/16 at 2pm.  [facility] Intern [facility]
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
## 2012-11-13 NOTE — Patient Instructions (Addendum)
Doing well.  Labs are stable.  Ultrasound was negative for a clot.  We will arrange for port removal.  Please call us if you have any questions or concerns.

## 2012-11-14 ENCOUNTER — Other Ambulatory Visit: Payer: Self-pay | Admitting: Radiology

## 2012-11-17 ENCOUNTER — Ambulatory Visit (HOSPITAL_COMMUNITY)
Admission: RE | Admit: 2012-11-17 | Discharge: 2012-11-17 | Disposition: A | Discharge status: Home / Self Care | Payer: BC Managed Care – PPO | Source: Ambulatory Visit | Attending: Adult Health | Admitting: Adult Health

## 2012-11-17 ENCOUNTER — Other Ambulatory Visit: Payer: Self-pay | Admitting: Adult Health

## 2012-11-17 ENCOUNTER — Encounter (HOSPITAL_COMMUNITY): Payer: Self-pay

## 2012-11-17 DIAGNOSIS — C50511 Malignant neoplasm of lower-outer quadrant of right female breast: Secondary | ICD-10-CM

## 2012-11-17 DIAGNOSIS — I10 Essential (primary) hypertension: Secondary | ICD-10-CM | POA: Insufficient documentation

## 2012-11-17 DIAGNOSIS — Z9221 Personal history of antineoplastic chemotherapy: Secondary | ICD-10-CM | POA: Insufficient documentation

## 2012-11-17 DIAGNOSIS — Z853 Personal history of malignant neoplasm of breast: Secondary | ICD-10-CM | POA: Insufficient documentation

## 2012-11-17 DIAGNOSIS — Z452 Encounter for adjustment and management of vascular access device: Secondary | ICD-10-CM | POA: Insufficient documentation

## 2012-11-17 MED ORDER — CEFAZOLIN SODIUM-DEXTROSE 2-3 GM-% IV SOLR
2.0000 g | INTRAVENOUS | Status: DC
  Filled 2012-11-17: qty 50

## 2012-11-17 NOTE — Procedures (Signed)
Successful removal of left anterior chest wall port-a-cath. No immediate post procedural complications.  
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
## 2012-11-18 ENCOUNTER — Encounter: Payer: Self-pay | Admitting: Specialist

## 2012-11-18 NOTE — Progress Notes (Signed)
Individual counseling session 7/22 at 2pm.  Lenoard Aden [facility] Counseling Intern

## 2012-11-27 ENCOUNTER — Ambulatory Visit
Admission: RE | Admit: 2012-11-27 | Payer: BC Managed Care – PPO | Source: Ambulatory Visit | Admitting: Radiation Oncology

## 2012-12-04 ENCOUNTER — Telehealth: Payer: Self-pay | Admitting: Medical Oncology

## 2012-12-04 NOTE — Telephone Encounter (Signed)
effexor 37.5 mg daily

## 2012-12-04 NOTE — Telephone Encounter (Signed)
Patient called reporting to be having crying episodes, believes it may be related to tamoxifen. Asking if she there is anything she can take to counter act the depression. Will review with MD.  Next sched appt with MD 01/21/13

## 2012-12-05 ENCOUNTER — Telehealth: Payer: Self-pay | Admitting: Medical Oncology

## 2012-12-05 MED ORDER — VENLAFAXINE HCL 37.5 MG PO TABS: 37.5000 mg | ORAL_TABLET | Freq: Every day | ORAL | Status: DC

## 2012-12-05 NOTE — Telephone Encounter (Signed)
Per MD, informed patient will send prescription to her listed pharmacy for Effexor 37.5 mg to be taken 1 tablet daily,  to help with the depression she has been experiencing. Pt verbalized understanding, encouraged pt to call office should she have any questions or concerns.

## 2012-12-11 ENCOUNTER — Encounter: Payer: Self-pay | Admitting: Radiation Oncology

## 2012-12-12 ENCOUNTER — Telehealth: Payer: Self-pay | Admitting: *Deleted

## 2012-12-12 ENCOUNTER — Ambulatory Visit
Admission: RE | Admit: 2012-12-12 | Discharge: 2012-12-12 | Disposition: A | Discharge status: Home / Self Care | Payer: BC Managed Care – PPO | Source: Ambulatory Visit | Attending: Radiation Oncology | Admitting: Radiation Oncology

## 2012-12-12 ENCOUNTER — Encounter: Payer: Self-pay | Admitting: Radiation Oncology

## 2012-12-12 VITALS — BP 129/72 | HR 72 | Temp 98.0°F | Resp 20 | Wt 163.1 lb

## 2012-12-12 DIAGNOSIS — C50511 Malignant neoplasm of lower-outer quadrant of right female breast: Secondary | ICD-10-CM

## 2012-12-12 HISTORY — DX: Personal history of irradiation: Z92.3

## 2012-12-12 NOTE — Telephone Encounter (Signed)
[facility] called, spoke with Lanora Manis, Singulair  10mg  daily and Xyzal  5mg  daily rxs with 3 refills on each for patient per Dr.Wentworth, patient at pharmacy waiting9:44 AM

## 2012-12-12 NOTE — Progress Notes (Signed)
Follow up rt cw rad txs 09/03/12-10/20/12,  Completed , well healed, still tanning, but some areas fading, no c/o pain or discomfort, does have fatigue, using cocoa butter and radiaplex lotion on rt side, started tamoxifen 20mg   Daily on  10/24/12 good appetite 8:38 AM

## 2012-12-12 NOTE — Progress Notes (Signed)
Department of Radiation Oncology  Phone:  [phone] Fax:        [phone]   Name: Tammie [name] MRN: [id number]  DOB: 1970-08-15  Date: 12/12/2012  Follow Up Visit Note  Diagnosis: T3N1 Right Breast Cancer  Summary and Interval since last radiation: Radiation to the right chest wall and supraclavicular fossa to 60.4 gray completed 10/20/2012  Interval History: Tammie [name] presents today for routine followup.  She is struggling a little bit emotionally. Her skin is hyperpigmented. She is using lotion on that. She is tolerating her tamoxifen well. She is considering a career change or perhaps moving. She has not been back to see her primary care physician and requests refills on her allergy medications.  Allergies:  Allergies  Allergen Reactions  . Food Other (See Comments)    Onions or chives (if raw, swelling of face/throat and hives.  If cooked, GI "issues.")    Medications:  Current Outpatient Prescriptions  Medication Sig Dispense Refill  . ferrous sulfate 325 (65 FE) MG tablet Take 325 mg by mouth 3 (three) times daily with meals.        . gabapentin (NEURONTIN) 300 MG capsule Take 1 capsule (300 mg total) by mouth 3 (three) times daily.  90 capsule  6  . levocetirizine (XYZAL) 5 MG tablet Take 5 mg by mouth every evening. [facility], with 3 refills per Dr.Jaquala Fuller      . LORazepam (ATIVAN) 0.5 MG tablet Take 1 tablet (0.5 mg total) by mouth every 6 (six) hours as needed (Nausea or vomiting).  30 tablet  3  . montelukast (SINGULAIR) 10 MG tablet Take 10 mg by mouth at bedtime. [facility] with 3 refills      . non-metallic deodorant (ALRA) MISC Apply 1 application topically daily. Apply after rad tx daily      . omeprazole (PRILOSEC) 40 MG capsule Take 1 capsule (40 mg total) by mouth daily.  30 capsule  3  . rizatriptan (MAXALT) 10 MG tablet Take 10 mg by mouth as needed. May repeat in 2 hours if needed      . senna (SENOKOT) 8.6  MG tablet Take 1 tablet by mouth daily.      . tamoxifen (NOLVADEX) 20 MG tablet Take 20 mg by mouth daily.      Marland Kitchen topiramate (TOPAMAX) 25 MG tablet Take 50 mg by mouth at bedtime.      Marland Kitchen venlafaxine (EFFEXOR) 37.5 MG tablet Take 1 tablet (37.5 mg total) by mouth daily.  30 tablet  1   No current facility-administered medications for this encounter.    Physical Exam:  Filed Vitals:   12/12/12 0832  BP: 129/72  Pulse: 72  Temp: 98 F (36.7 C)  Resp: 20   she is pleasant female in no distress sitting comfortably examining table. She has hyperpigmentation over her right chest wall. She has 5 out of 5 strength bilaterally. There is no palpable or visible signs of tumor recurrence on the chest wall or in the axilla.  IMPRESSION: Tammie [name] is a 42 y.o. female status post chemotherapy, mastectomy and adjuvant radiation to the right chest wall with resolving acute effects of treatment  PLAN:  We talked a lot about emotional healing through the process of cancer treatment. We talked about skin care in the use of vitamin E to decrease scarring. She has regularly scheduled followup with medical oncology. She will continue her tamoxifen and annual mammograms on her left breast.  Thea Silversmith, MD

## 2013-01-02 ENCOUNTER — Ambulatory Visit (INDEPENDENT_AMBULATORY_CARE_PROVIDER_SITE_OTHER): Payer: BC Managed Care – PPO | Admitting: Family

## 2013-01-02 ENCOUNTER — Encounter: Payer: Self-pay | Admitting: Family

## 2013-01-02 ENCOUNTER — Telehealth: Payer: Self-pay | Admitting: *Deleted

## 2013-01-02 VITALS — BP 133/93 | HR 81 | Temp 97.9°F | Resp 16 | Ht 67.5 in | Wt 159.1 lb

## 2013-01-02 DIAGNOSIS — C50519 Malignant neoplasm of lower-outer quadrant of unspecified female breast: Secondary | ICD-10-CM

## 2013-01-02 DIAGNOSIS — J309 Allergic rhinitis, unspecified: Secondary | ICD-10-CM

## 2013-01-02 DIAGNOSIS — K219 Gastro-esophageal reflux disease without esophagitis: Secondary | ICD-10-CM | POA: Insufficient documentation

## 2013-01-02 DIAGNOSIS — J302 Other seasonal allergic rhinitis: Secondary | ICD-10-CM

## 2013-01-02 DIAGNOSIS — G43909 Migraine, unspecified, not intractable, without status migrainosus: Secondary | ICD-10-CM

## 2013-01-02 DIAGNOSIS — C50512 Malignant neoplasm of lower-outer quadrant of left female breast: Secondary | ICD-10-CM

## 2013-01-02 DIAGNOSIS — Z23 Encounter for immunization: Secondary | ICD-10-CM

## 2013-01-02 MED ORDER — LEVOCETIRIZINE DIHYDROCHLORIDE 5 MG PO TABS: 5.0000 mg | ORAL_TABLET | Freq: Every evening | ORAL | Status: DC

## 2013-01-02 MED ORDER — OLOPATADINE HCL 0.1 % OP SOLN: 1.0000 [drp] | Freq: Two times a day (BID) | OPHTHALMIC | Status: DC

## 2013-01-02 NOTE — Patient Instructions (Signed)
Please schedule a complete physical in 1 month. Come fasting to this appointment. Welcome to [facility]!

## 2013-01-02 NOTE — Assessment & Plan Note (Signed)
Stable off of PPI  Monitor.  

## 2013-01-02 NOTE — Assessment & Plan Note (Signed)
S/p mastectomy. She is planning reconstruction. Follows with Dr. Park Breed.

## 2013-01-02 NOTE — Telephone Encounter (Signed)
Received fax from [facility] pharmacy requesting prior auth for pt's patanol Rx. Please review, complete and sign.

## 2013-01-02 NOTE — Assessment & Plan Note (Signed)
Well controlled on topamax.  Rarely needing maxalt.  Continue same.
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
## 2013-01-02 NOTE — Assessment & Plan Note (Signed)
Deteriorated. Cont current meds. Refer to allergist

## 2013-01-05 NOTE — Telephone Encounter (Signed)
Form faxed to [phone]. Awaiting approval / denial.

## 2013-01-05 NOTE — Telephone Encounter (Signed)
Signed.

## 2013-01-06 NOTE — Telephone Encounter (Signed)
Called [employer] to check status of PA. Was told that their dept does not handle pharmacy benefits for this pt. Called [employer] listed on the back of pt's insurance card and was given another # for [employer] [id number]). Spoke with Dietrich Pates, she asked me to fax current PA form to [phone]. Thinks it may require a different PA form and stated she cannot do PA over the phone. Form faxed # above and notified pharmacy of pending status.

## 2013-01-07 NOTE — Telephone Encounter (Signed)
"  correct" form received, completed, signed and faxed back to [phone]. Awaiting approval/denial status.

## 2013-01-07 NOTE — Telephone Encounter (Signed)
Received fax from [facility] that we have the incorrect form and they have faxed [place] the correct one. Have not seen another form come through today. CAlled and spoke with Dietrich Pates, she has re-faxed the form and asks that I write key# [id number] at the top of the new form so they will know it is not a duplicate. Notified pt of pending status.  Awaiting form.

## 2013-01-08 ENCOUNTER — Encounter: Payer: Self-pay | Admitting: Women's Health

## 2013-01-08 ENCOUNTER — Ambulatory Visit (INDEPENDENT_AMBULATORY_CARE_PROVIDER_SITE_OTHER): Payer: BC Managed Care – PPO | Admitting: Women's Health

## 2013-01-08 VITALS — BP 122/78 | Ht 67.0 in | Wt 160.2 lb

## 2013-01-08 DIAGNOSIS — Z01419 Encounter for gynecological examination (general) (routine) without abnormal findings: Secondary | ICD-10-CM

## 2013-01-08 DIAGNOSIS — N951 Menopausal and female climacteric states: Secondary | ICD-10-CM

## 2013-01-08 DIAGNOSIS — R7309 Other abnormal glucose: Secondary | ICD-10-CM

## 2013-01-08 LAB — HEMOGLOBIN A1C
Hgb A1c MFr Bld: 5.9 % — ABNORMAL HIGH (ref <5.7)
Mean Plasma Glucose: 123 mg/dL — ABNORMAL HIGH (ref <117)

## 2013-01-08 MED ORDER — VENLAFAXINE HCL ER 75 MG PO CP24: 75.0000 mg | ORAL_CAPSULE | Freq: Every day | ORAL | Status: DC

## 2013-01-08 NOTE — Progress Notes (Signed)
Tammie [name] 1970/11/06 [id number]    History:    New patient presents for annual exam. Right breast cancer diagnosed 12/2011 with positive node. +ER,PR, HER 2 neg. Mastectomy with +node/resecton, chemotherapy and has completed radiation therapy/[place], care now currently with Dr Welton Flakes. BRCA negative. TVH 01/2011 for fibroids causing anemia. BSO 07/2011 for benign ovarian cysts Dr Dion Body at [facility]. Planning right breast reconstructive surgery December 2014.   Past medical history, past surgical history, family history and social history were all reviewed and documented in the EPIC chart. Mental health counselor. History of a gastric bypass 2008, has lost greater than 100 pounds and has kept off.  Mother hypertension, diabetes, Alzheimer's.  Father stroke. 7 sisters, 2 with diabetes. No family history of cancer. Tammie [name] 20 doing well.   ROS:  A  ROS was performed and pertinent positives and negatives are included in the history.  Exam:  Filed Vitals:   01/08/13 0855  BP: 122/78    General appearance:  Normal Head/Neck:  Normal, without cervical or supraclavicular adenopathy. Thyroid:  Symmetrical, normal in size, without palpable masses or nodularity. Respiratory  Effort:  Normal  Auscultation:  Clear without wheezing or rhonchi Cardiovascular  Auscultation:  Regular rate, without rubs, murmurs or gallops  Edema/varicosities:  Not grossly evident Abdominal  Soft,nontender, without masses, guarding or rebound.  Liver/spleen:  No organomegaly noted  Hernia:  None appreciated  Skin  Inspection:  Grossly normal  Palpation:  Grossly normal Neurologic/psychiatric  Orientation:  Normal with appropriate conversation.  Mood/affect:  Normal  Genitourinary    Breasts: Examined lying and sitting.     Right: Mastectomy     Left: Without masses, retractions, discharge or axillary adenopathy/healed scars from reduction.   Inguinal/mons:  Normal without inguinal  adenopathy  External genitalia:  Normal  BUS/Urethra/Skene's glands:  Normal  Bladder:  Normal  Vagina:  Normal  Cervix: Absent  Uterus:  Absent  Adnexa/parametria:     Rt: Without masses or tenderness.   Lt: Without masses or tenderness.  Anus and perineum: Normal  Digital rectal exam: Normal sphincter tone without palpated masses or tenderness  Assessment/Plan:  42 y.o. SBF G1P1 for annual exam with complaint of hot flushes causing poor sleep.  Menopausal symptoms Right breast cancer 2013 on tamoxifen-Dr. Welton Flakes managing Elevated glucose on metabolic panel 10/2012 152, normal prior   Plan: Hemoglobin A1c. Options reviewed reviewed for hot flushes, will increase Effexor tooth 75 mg daily. Instructed to call if no relief of hot flushes. Topamax which was started when severely anemic and having headaches, is no longer anemic will wean off. SBE's, annual mammogram, encouraged to increase regular exercise, calcium rich diet, vitamin D 2000 daily encouraged. DEXA will schedule here.  Harrington Challenger [facility], 10:33 AM 01/08/2013

## 2013-01-08 NOTE — Patient Instructions (Addendum)

## 2013-01-09 MED ORDER — AZELASTINE HCL 0.05 % OP SOLN: 1.0000 [drp] | Freq: Two times a day (BID) | OPHTHALMIC | Status: DC

## 2013-01-09 NOTE — Telephone Encounter (Signed)
Received denial from [facility] stating pt has not tried one of their formulary alternatives such as: Elestat or Optivar.  Please advise if one of these would be appropriate alternative.

## 2013-01-09 NOTE — Telephone Encounter (Signed)
Rx sent for optivar.

## 2013-01-09 NOTE — Telephone Encounter (Signed)
Notified pt. 

## 2013-01-19 ENCOUNTER — Ambulatory Visit (HOSPITAL_COMMUNITY)
Admission: RE | Admit: 2013-01-19 | Discharge: 2013-01-19 | Disposition: A | Discharge status: Home / Self Care | Payer: BC Managed Care – PPO | Source: Ambulatory Visit | Attending: Oncology | Admitting: Oncology

## 2013-01-19 ENCOUNTER — Encounter (HOSPITAL_COMMUNITY): Payer: Self-pay

## 2013-01-19 ENCOUNTER — Other Ambulatory Visit (HOSPITAL_BASED_OUTPATIENT_CLINIC_OR_DEPARTMENT_OTHER): Payer: BC Managed Care – PPO | Admitting: Lab

## 2013-01-19 DIAGNOSIS — Z9884 Bariatric surgery status: Secondary | ICD-10-CM | POA: Insufficient documentation

## 2013-01-19 DIAGNOSIS — C50519 Malignant neoplasm of lower-outer quadrant of unspecified female breast: Secondary | ICD-10-CM | POA: Insufficient documentation

## 2013-01-19 DIAGNOSIS — N3289 Other specified disorders of bladder: Secondary | ICD-10-CM | POA: Insufficient documentation

## 2013-01-19 DIAGNOSIS — Z9010 Acquired absence of unspecified breast and nipple: Secondary | ICD-10-CM | POA: Insufficient documentation

## 2013-01-19 DIAGNOSIS — R110 Nausea: Secondary | ICD-10-CM | POA: Insufficient documentation

## 2013-01-19 DIAGNOSIS — Z923 Personal history of irradiation: Secondary | ICD-10-CM | POA: Insufficient documentation

## 2013-01-19 DIAGNOSIS — C50511 Malignant neoplasm of lower-outer quadrant of right female breast: Secondary | ICD-10-CM

## 2013-01-19 DIAGNOSIS — Z90710 Acquired absence of both cervix and uterus: Secondary | ICD-10-CM | POA: Insufficient documentation

## 2013-01-19 DIAGNOSIS — R079 Chest pain, unspecified: Secondary | ICD-10-CM | POA: Insufficient documentation

## 2013-01-19 DIAGNOSIS — Z9221 Personal history of antineoplastic chemotherapy: Secondary | ICD-10-CM | POA: Insufficient documentation

## 2013-01-19 LAB — CBC WITH DIFFERENTIAL/PLATELET
Basophils Absolute: 0 10*3/uL (ref 0.0–0.1)
EOS%: 0.5 % (ref 0.0–7.0)
HGB: 12.1 g/dL (ref 11.6–15.9)
LYMPH%: 52.3 % — ABNORMAL HIGH (ref 14.0–49.7)
MCH: 22.8 pg — ABNORMAL LOW (ref 25.1–34.0)
MCV: 72.9 fL — ABNORMAL LOW (ref 79.5–101.0)
MONO%: 10.5 % (ref 0.0–14.0)
Platelets: 214 10*3/uL (ref 145–400)
RBC: 5.28 10*6/uL (ref 3.70–5.45)
RDW: 18.5 % — ABNORMAL HIGH (ref 11.2–14.5)

## 2013-01-19 LAB — COMPREHENSIVE METABOLIC PANEL (CC13)
AST: 43 U/L — ABNORMAL HIGH (ref 5–34)
Albumin: 3.5 g/dL (ref 3.5–5.0)
Alkaline Phosphatase: 110 U/L (ref 40–150)
BUN: 7.3 mg/dL (ref 7.0–26.0)
Glucose: 82 mg/dl (ref 70–140)
Potassium: 4 mEq/L (ref 3.5–5.1)
Total Bilirubin: 0.32 mg/dL (ref 0.20–1.20)

## 2013-01-19 MED ORDER — IOHEXOL 300 MG/ML  SOLN
100.0000 mL | Freq: Once | INTRAMUSCULAR | Status: AC | PRN
  Administered 2013-01-19: 100 mL via INTRAVENOUS

## 2013-01-21 ENCOUNTER — Ambulatory Visit (HOSPITAL_BASED_OUTPATIENT_CLINIC_OR_DEPARTMENT_OTHER): Payer: BC Managed Care – PPO | Admitting: Oncology

## 2013-01-21 ENCOUNTER — Encounter: Payer: Self-pay | Admitting: Oncology

## 2013-01-21 ENCOUNTER — Telehealth: Payer: Self-pay | Admitting: *Deleted

## 2013-01-21 ENCOUNTER — Encounter: Payer: Self-pay | Admitting: Radiation Oncology

## 2013-01-21 VITALS — BP 166/86 | HR 81 | Temp 98.3°F | Resp 20 | Ht 67.0 in | Wt 161.3 lb

## 2013-01-21 DIAGNOSIS — C50519 Malignant neoplasm of lower-outer quadrant of unspecified female breast: Secondary | ICD-10-CM

## 2013-01-21 DIAGNOSIS — C50511 Malignant neoplasm of lower-outer quadrant of right female breast: Secondary | ICD-10-CM
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
## 2013-01-21 NOTE — Telephone Encounter (Signed)
appts made and printed...td 

## 2013-01-21 NOTE — Progress Notes (Signed)
Patient dropped off letter and paperwork from [employer] (disability).  Patient states that this has been sent before, but I did not see any notations of receipt prior to today.  Patient understand Dr. Michell Heinrich will be out starting tomorrow and would like this filled out if possible.  Will forward to her nurse for completion and then to Dr. Michell Heinrich for signature.

## 2013-01-21 NOTE — Progress Notes (Signed)
Put social security disability forms on nurse's desk; filled out what I could based on office notes.

## 2013-01-27 ENCOUNTER — Telehealth: Payer: Self-pay | Admitting: *Deleted

## 2013-01-27 ENCOUNTER — Telehealth: Payer: Self-pay | Admitting: Family

## 2013-01-27 ENCOUNTER — Telehealth: Payer: Self-pay | Admitting: Emergency Medicine

## 2013-01-27 NOTE — Telephone Encounter (Signed)
She should discuss this with her oncologist please.

## 2013-01-27 NOTE — Telephone Encounter (Signed)
Received message from pt stating she has been attending a support group for cancer survivors and it was suggested that she talk with her primary care Provider about a possible bone marrow biopsy as pt's white blood cell count continues to be low 4 months after treatment. Pt wants to know if she should discuss this with her oncologist or primary care?  Please advise.

## 2013-01-27 NOTE — Telephone Encounter (Signed)
Left message on voicemail to return my call.  

## 2013-01-27 NOTE — Telephone Encounter (Signed)
Patient called with concerns about her decreasing WBC counts. Patient requesting to be seen by Dr Welton Flakes asap to discuss this with her.

## 2013-01-27 NOTE — Telephone Encounter (Signed)
Received medical records from [facility] °

## 2013-01-28 ENCOUNTER — Other Ambulatory Visit: Payer: Self-pay | Admitting: Emergency Medicine

## 2013-01-28 ENCOUNTER — Ambulatory Visit: Payer: BC Managed Care – PPO | Admitting: Oncology

## 2013-01-28 DIAGNOSIS — C50919 Malignant neoplasm of unspecified site of unspecified female breast: Secondary | ICD-10-CM

## 2013-01-28 NOTE — Telephone Encounter (Signed)
Please have her see LA

## 2013-01-29 ENCOUNTER — Encounter: Payer: Self-pay | Admitting: Specialist

## 2013-01-29 NOTE — Progress Notes (Signed)
I met with Tammie [name] on October 1 in an exam room, because there had been a request from Dr. Milta Deiters office that the patient was tearful and upset.  I spent about 30 minutes with Tammie [name]; she was tearful most of the time.  She explained that crying is not a normal thing for her.  She attributes her emotion to a series of losses associated with her cancer diagnosis: her health; the security of believing that if she works hard and lives a good life, her life will go as planned; the sense of control; a body part; her job; her Environmental manager.  Tammie [name] is currently enrolled in our survivorship program, Finding Your New Normal, which is designed to address the issues faced when patients complete treatment.  I listened, supported her, talked about some specific steps she might take, which included seeing one of our counselors; assessing the effectiveness of her current medication for anxiety and depression; and enrolling in the [place] program at the [facility] which is scheduled to begin this month.  I will ask one of our counseling interns to make an appointment with Tammie [name]; she was very willing to see a counselor.

## 2013-01-30 NOTE — Telephone Encounter (Signed)
Left message on voicemail to return my call.  

## 2013-02-02 ENCOUNTER — Ambulatory Visit (INDEPENDENT_AMBULATORY_CARE_PROVIDER_SITE_OTHER): Payer: BC Managed Care – PPO | Admitting: Family

## 2013-02-02 ENCOUNTER — Encounter: Payer: Self-pay | Admitting: Family

## 2013-02-02 VITALS — BP 120/86 | HR 70 | Temp 97.7°F | Resp 16 | Ht 67.5 in | Wt 161.0 lb

## 2013-02-02 DIAGNOSIS — Z0000 Encounter for general adult medical examination without abnormal findings: Secondary | ICD-10-CM | POA: Insufficient documentation

## 2013-02-02 DIAGNOSIS — F32A Depression, unspecified: Secondary | ICD-10-CM | POA: Insufficient documentation

## 2013-02-02 DIAGNOSIS — Z23 Encounter for immunization: Secondary | ICD-10-CM

## 2013-02-02 DIAGNOSIS — F329 Major depressive disorder, single episode, unspecified: Secondary | ICD-10-CM

## 2013-02-02 LAB — LIPID PANEL
HDL: 61 mg/dL (ref >39)
LDL Cholesterol: 87 mg/dL (ref 0–99)
Total CHOL/HDL Ratio: 2.7 Ratio
Triglycerides: 92 mg/dL (ref <150)

## 2013-02-02 LAB — TSH: TSH: 1.405 u[IU]/mL (ref 0.350–4.500)

## 2013-02-02 LAB — URINALYSIS, ROUTINE W REFLEX MICROSCOPIC
Bilirubin Urine: NEGATIVE
Glucose, UA: NEGATIVE mg/dL
Hgb urine dipstick: NEGATIVE
Ketones, ur: NEGATIVE mg/dL
Nitrite: NEGATIVE
Specific Gravity, Urine: 1.016 (ref 1.005–1.030)
Urobilinogen, UA: 1 mg/dL (ref 0.0–1.0)

## 2013-02-02 MED ORDER — CITALOPRAM HYDROBROMIDE 20 MG PO TABS: ORAL_TABLET | ORAL | Status: DC

## 2013-02-02 NOTE — Telephone Encounter (Signed)
Pt in office today and confirmed receipt of message below.
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
## 2013-02-02 NOTE — Patient Instructions (Addendum)
Please complete lab work prior to leaving. Citalopram 20mg .  1/2 tablet by mouth daily for 1 week, then increase to full tablet once daily on week two. Follow up in 1 month.

## 2013-02-02 NOTE — Assessment & Plan Note (Signed)
Tdap today. We discussed addition of regular exercise 30 minutes 5 days a week.

## 2013-02-02 NOTE — Assessment & Plan Note (Signed)
Uncontrolled.  Will continue effexor and add citalopram.   I instructed pt to start 1/2 tablet once daily for 1 week and then increase to a full tablet once daily on week two as tolerated.  We discussed common side effects such as nausea, drowsiness and weight gain.  Also discussed rare but serious side effect of suicide ideation.  She is instructed to discontinue medication go directly to ED if this occurs.  Pt verbalizes understanding.  Plan follow up in 1 month to evaluate progress.  Also discuss, rare interaction with maxalt (seratonin syndrome).  She reports using maxalt rarely and I advised to use sparingly.

## 2013-02-03 ENCOUNTER — Telehealth: Payer: Self-pay

## 2013-02-03 NOTE — Telephone Encounter (Signed)
Patient is not a candidate for disability from radiation standpoint.If needs further explanation may schedule follow up appointment to discuss with Dr.Wentworth.Informed Angela Halbrook(financial advocate).

## 2013-02-04 ENCOUNTER — Encounter: Payer: Self-pay | Admitting: Family

## 2013-02-04 DIAGNOSIS — R1012 Left upper quadrant pain: Secondary | ICD-10-CM

## 2013-02-20 ENCOUNTER — Ambulatory Visit
Admission: RE | Admit: 2013-02-20 | Discharge: 2013-02-20 | Disposition: A | Discharge status: Home / Self Care | Payer: BC Managed Care – PPO | Source: Ambulatory Visit | Attending: Oncology | Admitting: Oncology

## 2013-02-20 ENCOUNTER — Other Ambulatory Visit: Payer: Self-pay | Admitting: Oncology

## 2013-02-20 DIAGNOSIS — C50511 Malignant neoplasm of lower-outer quadrant of right female breast: Secondary | ICD-10-CM

## 2013-02-24 ENCOUNTER — Ambulatory Visit (INDEPENDENT_AMBULATORY_CARE_PROVIDER_SITE_OTHER): Payer: BC Managed Care – PPO | Admitting: Internal Medicine

## 2013-02-24 ENCOUNTER — Encounter: Payer: Self-pay | Admitting: Internal Medicine

## 2013-02-24 ENCOUNTER — Other Ambulatory Visit: Payer: BC Managed Care – PPO

## 2013-02-24 VITALS — BP 138/84 | HR 87 | Ht 66.5 in | Wt 158.4 lb

## 2013-02-24 DIAGNOSIS — J302 Other seasonal allergic rhinitis: Secondary | ICD-10-CM

## 2013-02-24 DIAGNOSIS — J309 Allergic rhinitis, unspecified: Secondary | ICD-10-CM

## 2013-02-24 DIAGNOSIS — T788XXS Other adverse effects, not elsewhere classified, sequela: Secondary | ICD-10-CM

## 2013-02-24 DIAGNOSIS — T781XXA Other adverse food reactions, not elsewhere classified, initial encounter: Secondary | ICD-10-CM

## 2013-02-24 MED ORDER — AZELASTINE-FLUTICASONE 137-50 MCG/ACT NA SUSP: 1.0000 | Freq: Every day | NASAL | Status: DC

## 2013-02-24 NOTE — Patient Instructions (Addendum)
Order- lab Allergy profile    Dx allergic rhinitis             Food IgE profile    Dx Food allergy  Sample Dymista nasal spray   Try using 1-2 puffs each nostril once daily at bedtime. See if using this every day can help your nose.  When you return we can discuss [place] and allergy shots as options
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
## 2013-02-25 LAB — ALLERGY FULL PROFILE
Alternaria Alternata: <0.1 kU/L
Aspergillus fumigatus, m3: <0.1 kU/L
Bahia Grass: <0.1 kU/L
Cat Dander: <0.1 kU/L
Common Ragweed: <0.1 kU/L
D. farinae: <0.1 kU/L
Elm IgE: <0.1 kU/L
G009 Red Top: <0.1 kU/L
Goldenrod: <0.1 kU/L
Lamb's Quarters: <0.1 kU/L
Plantain: <0.1 kU/L
Stemphylium Botryosum: <0.1 kU/L
Sycamore Tree: <0.1 kU/L

## 2013-02-25 LAB — ALLERGEN FOOD PROFILE SPECIFIC IGE
Corn: <0.1 kU/L
Fish Cod: <0.1 kU/L
IgE (Immunoglobulin E), Serum: 5.8 IU/mL (ref 0.0–180.0)
Milk IgE: 0.15 kU/L — ABNORMAL HIGH
Orange: <0.1 kU/L
Peanut IgE: <0.1 kU/L
Soybean IgE: <0.1 kU/L
Tuna IgE: <0.1 kU/L
Wheat IgE: <0.1 kU/L

## 2013-03-02 ENCOUNTER — Ambulatory Visit: Payer: BC Managed Care – PPO | Admitting: Family

## 2013-03-02 ENCOUNTER — Telehealth: Payer: Self-pay | Admitting: *Deleted

## 2013-03-02 MED ORDER — CITALOPRAM HYDROBROMIDE 20 MG PO TABS: 20.0000 mg | ORAL_TABLET | Freq: Every day | ORAL | Status: DC

## 2013-03-02 NOTE — Telephone Encounter (Signed)
Faxed refill request received from pharmacy for Citalopram Last filled by MD on 10.06.14, #[id number] [new] Last AEX - 10.06.14 Next AEX - 1 Month [11.03.14 Canceled, has appt scheduled for 11.04.14] Please Advise/SLS

## 2013-03-03 ENCOUNTER — Ambulatory Visit (INDEPENDENT_AMBULATORY_CARE_PROVIDER_SITE_OTHER): Payer: BC Managed Care – PPO | Admitting: Family

## 2013-03-03 ENCOUNTER — Encounter: Payer: Self-pay | Admitting: Family

## 2013-03-03 VITALS — BP 140/90 | HR 68 | Temp 97.5°F | Resp 16 | Ht 67.5 in | Wt 160.0 lb

## 2013-03-03 DIAGNOSIS — R110 Nausea: Secondary | ICD-10-CM | POA: Insufficient documentation

## 2013-03-03 DIAGNOSIS — R109 Unspecified abdominal pain: Secondary | ICD-10-CM

## 2013-03-03 DIAGNOSIS — F329 Major depressive disorder, single episode, unspecified: Secondary | ICD-10-CM

## 2013-03-03 LAB — HEPATIC FUNCTION PANEL
ALT: 49 U/L — ABNORMAL HIGH (ref 0–35)
AST: 64 U/L — ABNORMAL HIGH (ref 0–37)
Albumin: 3.8 g/dL (ref 3.5–5.2)
Bilirubin, Direct: 0.1 mg/dL (ref 0.0–0.3)
Total Protein: 6.7 g/dL (ref 6.0–8.3)

## 2013-03-03 LAB — LIPASE: Lipase: 28 U/L (ref 0–75)

## 2013-03-03 MED ORDER — CITALOPRAM HYDROBROMIDE 20 MG PO TABS: 20.0000 mg | ORAL_TABLET | Freq: Every day | ORAL | Status: DC
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
## 2013-03-03 NOTE — Assessment & Plan Note (Signed)
Mild.  Suspect constipation versus musculoskeletal. Check lipase/lft.  Recommended dose of miralax.  Follow up if symptoms worsen or if symptoms do not improve.

## 2013-03-03 NOTE — Assessment & Plan Note (Signed)
Improved with addition of citalopram. Continue same along with effexor.  We discussed increasing citalopram to 30mg  but she wishes to give 20mg  some more time.

## 2013-03-03 NOTE — Patient Instructions (Signed)
Please complete your lab work prior to leaving. Follow up in 3 months, call if abdominal discomfort worsens or if not resolved in 1 week.

## 2013-03-04 NOTE — Telephone Encounter (Signed)
Liver function testing is mildly elevated.  Given her abdominal discomfort, I would like to refer her for abdominal ultrasound.

## 2013-03-05 ENCOUNTER — Telehealth: Payer: Self-pay | Admitting: Family

## 2013-03-05 ENCOUNTER — Ambulatory Visit (HOSPITAL_BASED_OUTPATIENT_CLINIC_OR_DEPARTMENT_OTHER)
Admission: RE | Admit: 2013-03-05 | Discharge: 2013-03-05 | Disposition: A | Discharge status: Home / Self Care | Payer: BC Managed Care – PPO | Source: Ambulatory Visit | Attending: Family | Admitting: Family

## 2013-03-05 DIAGNOSIS — R1012 Left upper quadrant pain: Secondary | ICD-10-CM | POA: Insufficient documentation

## 2013-03-05 DIAGNOSIS — R110 Nausea: Secondary | ICD-10-CM | POA: Insufficient documentation

## 2013-03-05 DIAGNOSIS — Z853 Personal history of malignant neoplasm of breast: Secondary | ICD-10-CM | POA: Insufficient documentation

## 2013-03-05 NOTE — Progress Notes (Signed)
Quick Note:  Advised pt of labs per CY. Pt verbalized understanding and has no further questions at this time ______ 

## 2013-03-05 NOTE — Telephone Encounter (Signed)
See my chart message

## 2013-03-10 ENCOUNTER — Telehealth: Payer: Self-pay | Admitting: Family

## 2013-03-10 ENCOUNTER — Encounter: Payer: Self-pay | Admitting: Internal Medicine

## 2013-03-10 DIAGNOSIS — Z91018 Allergy to other foods: Secondary | ICD-10-CM | POA: Insufficient documentation

## 2013-03-10 NOTE — Assessment & Plan Note (Signed)
Onions/ scallions- hives

## 2013-03-10 NOTE — Telephone Encounter (Signed)
Please see 11/6 my chart note.  Please contact pt and inform.

## 2013-03-10 NOTE — Assessment & Plan Note (Signed)
Plan-Allergy Profile, food IgE profile

## 2013-03-10 NOTE — Telephone Encounter (Signed)
Left message for pt to return my call.

## 2013-03-11 NOTE — Telephone Encounter (Signed)
Pt left 2 messages returning my call and stated I could leave a detailed message on her voicemail.  Left detailed message of need to return for additional blood tests for further work up and to call if any questions. Pt returned my call and was notified of instructions. Pt voices understanding.

## 2013-03-12 LAB — HEPATITIS C ANTIBODY: HCV Ab: NEGATIVE

## 2013-03-12 LAB — IRON AND TIBC
%SAT: 11 % — ABNORMAL LOW (ref 20–55)
TIBC: 470 ug/dL (ref 250–470)

## 2013-03-12 LAB — FERRITIN: Ferritin: 6 ng/mL — ABNORMAL LOW (ref 10–291)

## 2013-03-14 LAB — CERULOPLASMIN: Ceruloplasmin: 55 mg/dL (ref 20–60)

## 2013-03-18 ENCOUNTER — Encounter: Payer: Self-pay | Admitting: Family

## 2013-03-23 ENCOUNTER — Encounter (HOSPITAL_COMMUNITY): Payer: Self-pay

## 2013-03-25 ENCOUNTER — Other Ambulatory Visit: Payer: Self-pay | Admitting: Plastic Surgery

## 2013-03-25 DIAGNOSIS — Z9011 Acquired absence of right breast and nipple: Secondary | ICD-10-CM

## 2013-03-25 NOTE — H&P (Signed)
This document contains confidential information from a [facility] medical record system and may be unauthenticated. Release may be made only with a valid authorization or in accordance with applicable policies of Medical Center or its affiliates. This document must be maintained in a secure manner or discarded/destroyed as required by Medical Center policy or by a confidential means such as shredding.     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 female.  HPI The patient is a 42 yrs old female here for history and physical for right breast reconstruction. She was diagnosed with right breast invasive ductal carcinoma. She underwent a right mastectomy with sentinel lymph node biopsy performed at [facility] by Dr. Nancy Damore (12/2011). It was ER/PR positive, HER-2/neu negative. The path showed multifocal cancer (5 cm and 0.9 cm) with lymphovascular invasion and associated DCIS, one sentinel node was positive for metastatic disease and went on to have a lymph node dissection but all others were negative. She underwent chemotherapy treatment with Adriamycin and Cytoxan for 4 cycles, followed by paclitaxel ending 04/2012. The paclitaxel was stopped early due to neuropathies and Abraxane was started and ended 07/2012. Radiation was given by Dr. Stacy Wentworth and completed 10/20/2012. She is now on tamoxifen 20 mg. She has hypertension, heart murmur, arthritis, chronic back pain, neck pain, and migraines. She is 5 feet 7 inches tall, weighs 158 pounds  and wore a 34 A-B bra. She underwent a breast reduction in 2003. She has several incisions on her abdomen from lap surgery. Her abdomen is small.  The following portions of the patient's history were reviewed and updated as appropriate: allergies, current medications, past family history, past medical history, past social history, past surgical history and problem list.  Review of Systems  All other systems reviewed and are negative.   Objective:    Physical Exam  Constitutional: She is oriented to person, place, and time. She appears well-developed and well-nourished. No distress.  HENT:   Head: Normocephalic and atraumatic.   Nose: Nose normal.   Mouth/Throat: Oropharynx is clear and moist.  Eyes: Conjunctivae and EOM are normal. Pupils are equal, round, and reactive to light.  Neck: Normal range of motion. Neck supple. No tracheal deviation present. No thyromegaly present.  Cardiovascular: Normal rate, regular rhythm, normal heart sounds and intact distal pulses.  Exam reveals no gallop and no friction rub.    No murmur heard. Pulmonary/Chest: Effort normal and breath sounds normal. No stridor. No respiratory distress. She has no wheezes.  Right mastectomy scarring without signs of infection  Abdominal: Soft. Bowel sounds are normal.  Musculoskeletal: Normal range of motion. She exhibits no edema and no tenderness.  Neurological: She is alert and oriented to person, place, and time. No cranial nerve deficit. She exhibits normal muscle tone. Coordination normal.  Skin: Skin is warm and dry. No rash noted. No erythema.    Psychiatric: She has a normal mood and affect. Her behavior is normal. Judgment and thought content normal.   Assessment:   1.  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  Breast cancer, right (HCC)         Plan:      The patient would like to undergo a right breast reconstruction using the right latissimus muscle with expander placement.  The plan will be to  place an implant on the left at the time of the implant placement on the right. The procedure, possible risks, benefits and complications were discussed with the patient and she desires to proceed and consent was obtained.       Medications Ordered This Encounter      HYDROcodone-acetaminophen (NORCO) 5-325 mg per tablet Take 1 tablet by mouth every 6 (six) hours as needed for up to 10 days for Pain.    cephalexin (KEFLEX) 500 MG capsule Take 1 capsule (500 mg total) by mouth 4 times daily.    diazepam (VALIUM) 2 MG tablet Take 1 tablet (2 mg total) by mouth every 6 (six) hours as needed for up to 10 days for Anxiety.      

## 2013-03-28 NOTE — Pre-Procedure Instructions (Addendum)
Tammie [name]  03/28/2013   Your procedure is scheduled on:  December 8  Report to [facility] Entrance "A" [address] at [employer] AM.  Call this number if you have problems the morning of surgery: [phone]   Remember:   Do not eat food or drink liquids after midnight.   Take these medicines the morning of surgery with A SIP OF WATER: Eye Drops, Nasal Spray, , Gabapentin, Lorazepam (if needed), Tamoxifen,   STOP/ Do not take Aspirin, Aleve, Naproxen, Advil, Ibuprofen, Vitamin, Herbs, and Supplements starting today   Do not wear jewelry, make-up or nail polish.  Do not wear lotions, powders, or perfumes. You may wear deodorant.  Do not shave 48 hours prior to surgery. Men may shave face and neck.  Do not bring valuables to the hospital.  [facility] is not responsible                  for any belongings or valuables.               Contacts, dentures or bridgework may not be worn into surgery.  Leave suitcase in the car. After surgery it may be brought to your room.  For patients admitted to the hospital, discharge time is determined by your                treatment team.               Special Instructions: Shower using CHG 2 nights before surgery and the night before surgery.  If you shower the day of surgery use CHG.  Use special wash - you have one bottle of CHG for all showers.  You should use approximately 1/3 of the bottle for each shower.   Please read over the following fact sheets that you were given: Pain Booklet, Coughing and Deep Breathing and Surgical Site Infection Prevention

## 2013-03-30 ENCOUNTER — Encounter (HOSPITAL_COMMUNITY): Payer: Self-pay

## 2013-03-30 ENCOUNTER — Encounter (HOSPITAL_COMMUNITY)
Admission: RE | Admit: 2013-03-30 | Discharge: 2013-03-30 | Disposition: A | Discharge status: Home / Self Care | Payer: BC Managed Care – PPO | Source: Ambulatory Visit | Attending: Plastic Surgery | Admitting: Plastic Surgery

## 2013-03-30 ENCOUNTER — Other Ambulatory Visit (HOSPITAL_COMMUNITY): Payer: Self-pay | Admitting: *Deleted

## 2013-03-30 VITALS — BP 137/75 | HR 63 | Temp 97.6°F | Resp 20 | Ht 67.0 in | Wt 160.3 lb

## 2013-03-30 DIAGNOSIS — Z01812 Encounter for preprocedural laboratory examination: Secondary | ICD-10-CM | POA: Insufficient documentation

## 2013-03-30 DIAGNOSIS — Z01818 Encounter for other preprocedural examination: Secondary | ICD-10-CM | POA: Insufficient documentation

## 2013-03-30 DIAGNOSIS — Z9011 Acquired absence of right breast and nipple: Secondary | ICD-10-CM

## 2013-03-30 HISTORY — DX: Anxiety disorder, unspecified: F41.9

## 2013-03-30 HISTORY — DX: Pneumonia, unspecified organism: J18.9

## 2013-03-30 HISTORY — DX: Gastro-esophageal reflux disease without esophagitis: K21.9

## 2013-03-30 LAB — CBC
HCT: 38.1 % (ref 36.0–46.0)
Hemoglobin: 12.3 g/dL (ref 12.0–15.0)
MCH: 24.5 pg — ABNORMAL LOW (ref 26.0–34.0)
MCHC: 32.3 g/dL (ref 30.0–36.0)
Platelets: 232 10*3/uL (ref 150–400)
WBC: 2.7 10*3/uL — ABNORMAL LOW (ref 4.0–10.5)

## 2013-03-30 LAB — BASIC METABOLIC PANEL
CO2: 25 mEq/L (ref 19–32)
Calcium: 8.9 mg/dL (ref 8.4–10.5)
Creatinine, Ser: 0.71 mg/dL (ref 0.50–1.10)
GFR calc Af Amer: >90 mL/min (ref >90)
Glucose, Bld: 92 mg/dL (ref 70–99)
Potassium: 3.5 mEq/L (ref 3.5–5.1)
Sodium: 143 mEq/L (ref 135–145)

## 2013-04-05 MED ORDER — CEFAZOLIN SODIUM-DEXTROSE 2-3 GM-% IV SOLR
2.0000 g | INTRAVENOUS | Status: AC
  Administered 2013-04-06: 2 g via INTRAVENOUS
  Filled 2013-04-05: qty 50

## 2013-04-06 ENCOUNTER — Inpatient Hospital Stay (HOSPITAL_COMMUNITY)
Admission: RE | Admit: 2013-04-06 | Discharge: 2013-04-09 | DRG: 585 | Disposition: A | Discharge status: Home / Self Care | Payer: BC Managed Care – PPO | Source: Ambulatory Visit | Attending: Plastic Surgery | Admitting: Plastic Surgery

## 2013-04-06 ENCOUNTER — Inpatient Hospital Stay (HOSPITAL_COMMUNITY): Payer: BC Managed Care – PPO | Admitting: Certified Registered Nurse Anesthetist

## 2013-04-06 ENCOUNTER — Other Ambulatory Visit: Payer: Self-pay | Admitting: Plastic Surgery

## 2013-04-06 ENCOUNTER — Encounter (HOSPITAL_COMMUNITY): Payer: Self-pay | Admitting: *Deleted

## 2013-04-06 ENCOUNTER — Encounter (HOSPITAL_COMMUNITY)
Admission: RE | Disposition: A | Discharge status: Home / Self Care | Payer: Self-pay | Source: Ambulatory Visit | Attending: Plastic Surgery

## 2013-04-06 ENCOUNTER — Encounter (HOSPITAL_COMMUNITY): Payer: BC Managed Care – PPO | Admitting: Certified Registered Nurse Anesthetist

## 2013-04-06 DIAGNOSIS — Z9010 Acquired absence of unspecified breast and nipple: Secondary | ICD-10-CM

## 2013-04-06 DIAGNOSIS — F329 Major depressive disorder, single episode, unspecified: Secondary | ICD-10-CM | POA: Diagnosis present

## 2013-04-06 DIAGNOSIS — Z853 Personal history of malignant neoplasm of breast: Secondary | ICD-10-CM

## 2013-04-06 DIAGNOSIS — Z9011 Acquired absence of right breast and nipple: Secondary | ICD-10-CM

## 2013-04-06 DIAGNOSIS — C50919 Malignant neoplasm of unspecified site of unspecified female breast: Principal | ICD-10-CM | POA: Diagnosis present

## 2013-04-06 DIAGNOSIS — F3289 Other specified depressive episodes: Secondary | ICD-10-CM | POA: Diagnosis present

## 2013-04-06 DIAGNOSIS — K219 Gastro-esophageal reflux disease without esophagitis: Secondary | ICD-10-CM | POA: Diagnosis present

## 2013-04-06 HISTORY — DX: Prediabetes: R73.03

## 2013-04-06 HISTORY — DX: Polyneuropathy, unspecified: G62.9

## 2013-04-06 HISTORY — PX: BREAST RECONSTRUCTION WITH PLACEMENT OF TISSUE EXPANDER AND FLEX HD (ACELLULAR HYDRATED DERMIS): SHX6295

## 2013-04-06 HISTORY — PX: RECONSTRUCTION BREAST W/ LATISSIMUS DORSI FLAP: SUR1078

## 2013-04-06 HISTORY — PX: TISSUE EXPANDER PLACEMENT: SHX2530

## 2013-04-06 SURGERY — BREAST RECONSTRUCTION WITH PLACEMENT OF TISSUE EXPANDER AND FLEX HD (ACELLULAR HYDRATED DERMIS)
Anesthesia: General | Site: Breast | Laterality: Right

## 2013-04-06 MED ORDER — VENLAFAXINE HCL ER 75 MG PO CP24
75.0000 mg | ORAL_CAPSULE | Freq: Every day | ORAL | Status: DC
  Administered 2013-04-07 – 2013-04-09 (×3): 75 mg via ORAL
  Filled 2013-04-06 (×3): qty 1

## 2013-04-06 MED ORDER — SENNA 8.6 MG PO TABS
1.0000 | ORAL_TABLET | Freq: Two times a day (BID) | ORAL | Status: DC
  Administered 2013-04-06 – 2013-04-09 (×6): 8.6 mg via ORAL
  Filled 2013-04-06 (×8): qty 1

## 2013-04-06 MED ORDER — ROCURONIUM BROMIDE 100 MG/10ML IV SOLN
INTRAVENOUS | Status: DC | PRN
  Administered 2013-04-06 (×2): 10 mg via INTRAVENOUS
  Administered 2013-04-06: 50 mg via INTRAVENOUS
  Administered 2013-04-06: 10 mg via INTRAVENOUS

## 2013-04-06 MED ORDER — GLYCOPYRROLATE 0.2 MG/ML IJ SOLN
INTRAMUSCULAR | Status: DC | PRN
  Administered 2013-04-06: 0.6 mg via INTRAVENOUS
  Administered 2013-04-06: 0.2 mg via INTRAVENOUS

## 2013-04-06 MED ORDER — ONDANSETRON HCL 4 MG/2ML IJ SOLN: 4.0000 mg | Freq: Four times a day (QID) | INTRAMUSCULAR | Status: DC | PRN

## 2013-04-06 MED ORDER — LIDOCAINE-EPINEPHRINE (PF) 1 %-1:200000 IJ SOLN
INTRAMUSCULAR | Status: DC | PRN
  Administered 2013-04-06: 10 mL

## 2013-04-06 MED ORDER — AZELASTINE HCL 0.1 % NA SOLN
2.0000 | Freq: Every day | NASAL | Status: DC
  Administered 2013-04-07 – 2013-04-09 (×3): 2 via NASAL
  Filled 2013-04-06: qty 30

## 2013-04-06 MED ORDER — SODIUM CHLORIDE 0.9 % IV SOLN
3.0000 g | Freq: Four times a day (QID) | INTRAVENOUS | Status: DC
  Administered 2013-04-06 – 2013-04-09 (×10): 3 g via INTRAVENOUS
  Filled 2013-04-06 (×14): qty 3

## 2013-04-06 MED ORDER — AZELASTINE-FLUTICASONE 137-50 MCG/ACT NA SUSP: 1.0000 | Freq: Every day | NASAL | Status: DC

## 2013-04-06 MED ORDER — LIDOCAINE-EPINEPHRINE (PF) 1 %-1:200000 IJ SOLN
INTRAMUSCULAR | Status: AC
  Filled 2013-04-06: qty 10

## 2013-04-06 MED ORDER — FENTANYL CITRATE 0.05 MG/ML IJ SOLN
INTRAMUSCULAR | Status: DC | PRN
  Administered 2013-04-06: 75 ug via INTRAVENOUS
  Administered 2013-04-06: 50 ug via INTRAVENOUS
  Administered 2013-04-06: 125 ug via INTRAVENOUS

## 2013-04-06 MED ORDER — HYDROMORPHONE HCL PF 1 MG/ML IJ SOLN
INTRAMUSCULAR | Status: AC
  Filled 2013-04-06: qty 1

## 2013-04-06 MED ORDER — HYDROMORPHONE HCL PF 1 MG/ML IJ SOLN
0.2500 mg | INTRAMUSCULAR | Status: DC | PRN
  Administered 2013-04-06 (×4): 0.5 mg via INTRAVENOUS

## 2013-04-06 MED ORDER — GABAPENTIN 300 MG PO CAPS
300.0000 mg | ORAL_CAPSULE | Freq: Three times a day (TID) | ORAL | Status: DC
  Administered 2013-04-06 – 2013-04-09 (×8): 300 mg via ORAL
  Filled 2013-04-06 (×11): qty 1

## 2013-04-06 MED ORDER — SODIUM CHLORIDE 0.9 % IR SOLN
Status: DC | PRN
  Administered 2013-04-06: 14:00:00

## 2013-04-06 MED ORDER — FERROUS SULFATE 325 (65 FE) MG PO TABS
325.0000 mg | ORAL_TABLET | Freq: Three times a day (TID) | ORAL | Status: DC
  Administered 2013-04-07 – 2013-04-09 (×7): 325 mg via ORAL
  Filled 2013-04-06 (×12): qty 1

## 2013-04-06 MED ORDER — CETIRIZINE HCL 10 MG PO TABS
10.0000 mg | ORAL_TABLET | Freq: Every day | ORAL | Status: DC
  Administered 2013-04-06 – 2013-04-08 (×3): 10 mg via ORAL
  Filled 2013-04-06 (×4): qty 1

## 2013-04-06 MED ORDER — MONTELUKAST SODIUM 10 MG PO TABS
10.0000 mg | ORAL_TABLET | Freq: Every day | ORAL | Status: DC
  Administered 2013-04-06 – 2013-04-08 (×3): 10 mg via ORAL
  Filled 2013-04-06 (×5): qty 1

## 2013-04-06 MED ORDER — NEOSTIGMINE METHYLSULFATE 1 MG/ML IJ SOLN
INTRAMUSCULAR | Status: DC | PRN
  Administered 2013-04-06: 4 mg via INTRAVENOUS

## 2013-04-06 MED ORDER — PANTOPRAZOLE SODIUM 40 MG PO TBEC
40.0000 mg | DELAYED_RELEASE_TABLET | Freq: Every day | ORAL | Status: DC
  Administered 2013-04-07 – 2013-04-09 (×4): 40 mg via ORAL
  Filled 2013-04-06 (×4): qty 1

## 2013-04-06 MED ORDER — MORPHINE SULFATE (PF) 1 MG/ML IV SOLN
INTRAVENOUS | Status: DC
  Administered 2013-04-06: 18:00:00 via INTRAVENOUS
  Administered 2013-04-07: 4.5 mg via INTRAVENOUS
  Administered 2013-04-07: 6 mg via INTRAVENOUS

## 2013-04-06 MED ORDER — ACETAMINOPHEN 325 MG PO TABS
650.0000 mg | ORAL_TABLET | Freq: Four times a day (QID) | ORAL | Status: DC | PRN
  Filled 2013-04-06: qty 2

## 2013-04-06 MED ORDER — NALOXONE HCL 0.4 MG/ML IJ SOLN: 0.4000 mg | INTRAMUSCULAR | Status: DC | PRN

## 2013-04-06 MED ORDER — ACETAMINOPHEN 650 MG RE SUPP: 650.0000 mg | Freq: Four times a day (QID) | RECTAL | Status: DC | PRN

## 2013-04-06 MED ORDER — MORPHINE SULFATE (PF) 1 MG/ML IV SOLN
INTRAVENOUS | Status: AC
  Filled 2013-04-06: qty 25

## 2013-04-06 MED ORDER — KCL IN DEXTROSE-NACL 20-5-0.45 MEQ/L-%-% IV SOLN
INTRAVENOUS | Status: DC
  Administered 2013-04-06: 100 mL/h via INTRAVENOUS
  Administered 2013-04-07 – 2013-04-08 (×2): via INTRAVENOUS
  Administered 2013-04-09: 100 mL/h via INTRAVENOUS
  Filled 2013-04-06 (×9): qty 1000

## 2013-04-06 MED ORDER — LIDOCAINE HCL (CARDIAC) 20 MG/ML IV SOLN
INTRAVENOUS | Status: DC | PRN
  Administered 2013-04-06: 85 mg via INTRAVENOUS

## 2013-04-06 MED ORDER — SODIUM CHLORIDE 0.9 % IJ SOLN: 9.0000 mL | INTRAMUSCULAR | Status: DC | PRN

## 2013-04-06 MED ORDER — OXYCODONE HCL 5 MG PO TABS: 5.0000 mg | ORAL_TABLET | Freq: Once | ORAL | Status: DC | PRN

## 2013-04-06 MED ORDER — EVICEL 5 ML EX KIT
PACK | CUTANEOUS | Status: DC | PRN
  Administered 2013-04-06 (×2): 5 mL

## 2013-04-06 MED ORDER — LACTATED RINGERS IV SOLN
INTRAVENOUS | Status: DC | PRN
  Administered 2013-04-06 (×3): via INTRAVENOUS

## 2013-04-06 MED ORDER — 0.9 % SODIUM CHLORIDE (POUR BTL) OPTIME
TOPICAL | Status: DC | PRN
  Administered 2013-04-06 (×2): 1000 mL

## 2013-04-06 MED ORDER — OXYCODONE HCL 5 MG/5ML PO SOLN: 5.0000 mg | Freq: Once | ORAL | Status: DC | PRN

## 2013-04-06 MED ORDER — DIPHENHYDRAMINE HCL 12.5 MG/5ML PO ELIX: 12.5000 mg | ORAL_SOLUTION | Freq: Four times a day (QID) | ORAL | Status: DC | PRN

## 2013-04-06 MED ORDER — FLUTICASONE PROPIONATE 50 MCG/ACT NA SUSP
2.0000 | Freq: Every day | NASAL | Status: DC
  Filled 2013-04-06: qty 16

## 2013-04-06 MED ORDER — LEVOCETIRIZINE DIHYDROCHLORIDE 5 MG PO TABS: 5.0000 mg | ORAL_TABLET | Freq: Every evening | ORAL | Status: DC

## 2013-04-06 MED ORDER — PROPOFOL 10 MG/ML IV BOLUS
INTRAVENOUS | Status: DC | PRN
  Administered 2013-04-06: 155 mg via INTRAVENOUS

## 2013-04-06 MED ORDER — OXYCODONE HCL 5 MG PO TABS
ORAL_TABLET | ORAL | Status: AC
  Administered 2013-04-06: 5 mg
  Filled 2013-04-06: qty 1

## 2013-04-06 MED ORDER — PROMETHAZINE HCL 25 MG/ML IJ SOLN: 6.2500 mg | INTRAMUSCULAR | Status: DC | PRN

## 2013-04-06 MED ORDER — DIPHENHYDRAMINE HCL 50 MG/ML IJ SOLN
12.5000 mg | Freq: Four times a day (QID) | INTRAMUSCULAR | Status: DC | PRN
  Administered 2013-04-06 – 2013-04-07 (×2): 12.5 mg via INTRAVENOUS
  Filled 2013-04-06 (×2): qty 1

## 2013-04-06 MED ORDER — MIDAZOLAM HCL 5 MG/5ML IJ SOLN
INTRAMUSCULAR | Status: DC | PRN
  Administered 2013-04-06 (×2): 2 mg via INTRAVENOUS

## 2013-04-06 MED ORDER — EVICEL 5 ML EX KIT
PACK | CUTANEOUS | Status: AC
  Filled 2013-04-06: qty 2

## 2013-04-06 MED ORDER — ARTIFICIAL TEARS OP OINT
TOPICAL_OINTMENT | OPHTHALMIC | Status: DC | PRN
  Administered 2013-04-06: 1 via OPHTHALMIC

## 2013-04-06 MED ORDER — DIAZEPAM 2 MG PO TABS
2.0000 mg | ORAL_TABLET | Freq: Two times a day (BID) | ORAL | Status: DC | PRN
  Administered 2013-04-07 – 2013-04-08 (×2): 2 mg via ORAL
  Filled 2013-04-06 (×2): qty 1

## 2013-04-06 SURGICAL SUPPLY — 58 items
BAG DECANTER FOR FLEXI CONT (MISCELLANEOUS) ×2 IMPLANT
BINDER BREAST LRG (GAUZE/BANDAGES/DRESSINGS) ×2 IMPLANT
BINDER BREAST XLRG (GAUZE/BANDAGES/DRESSINGS) IMPLANT
BIOPATCH RED 1 DISK 7.0 (GAUZE/BANDAGES/DRESSINGS) ×6 IMPLANT
CANISTER SUCTION 2500CC (MISCELLANEOUS) ×2 IMPLANT
CHLORAPREP W/TINT 26ML (MISCELLANEOUS) ×4 IMPLANT
CLOTH BEACON ORANGE TIMEOUT ST (SAFETY) IMPLANT
CONT SPEC 4OZ CLIKSEAL STRL BL (MISCELLANEOUS) ×2 IMPLANT
COVER SURGICAL LIGHT HANDLE (MISCELLANEOUS) ×2 IMPLANT
CPX 4 with suture tabs, medium height breast tissu (Breast) ×2 IMPLANT
DERMABOND ADVANCED (GAUZE/BANDAGES/DRESSINGS) ×2
DERMABOND ADVANCED .7 DNX12 (GAUZE/BANDAGES/DRESSINGS) ×2 IMPLANT
DRAIN CHANNEL 19F RND (DRAIN) ×6 IMPLANT
DRAPE ORTHO SPLIT 77X108 STRL (DRAPES) ×4
DRAPE PROXIMA HALF (DRAPES) ×6 IMPLANT
DRAPE SURG 17X23 STRL (DRAPES) ×8 IMPLANT
DRAPE SURG ORHT 6 SPLT 77X108 (DRAPES) ×4 IMPLANT
DRAPE WARM FLUID 44X44 (DRAPE) ×2 IMPLANT
DRSG MEPILEX BORDER 4X12 (GAUZE/BANDAGES/DRESSINGS) ×2 IMPLANT
DRSG PAD ABDOMINAL 8X10 ST (GAUZE/BANDAGES/DRESSINGS) ×4 IMPLANT
ELECT BLADE 4.0 EZ CLEAN MEGAD (MISCELLANEOUS) ×2
ELECT REM PT RETURN 9FT ADLT (ELECTROSURGICAL) ×2
ELECTRODE BLDE 4.0 EZ CLN MEGD (MISCELLANEOUS) ×1 IMPLANT
ELECTRODE REM PT RTRN 9FT ADLT (ELECTROSURGICAL) ×1 IMPLANT
EVACUATOR SILICONE 100CC (DRAIN) ×6 IMPLANT
GLOVE BIO SURGEON STRL SZ 6.5 (GLOVE) ×10 IMPLANT
GLOVE BIO SURGEON STRL SZ8.5 (GLOVE) ×4 IMPLANT
GLOVE BIOGEL PI IND STRL 7.0 (GLOVE) ×1 IMPLANT
GLOVE BIOGEL PI INDICATOR 7.0 (GLOVE) ×1
GLOVE SURG SS PI 6.5 STRL IVOR (GLOVE) ×2 IMPLANT
GOWN BRE IMP SLV AUR XL STRL (GOWN DISPOSABLE) ×2 IMPLANT
GOWN STRL NON-REIN LRG LVL3 (GOWN DISPOSABLE) ×8 IMPLANT
GOWN STRL REIN 2XL XLG LVL4 (GOWN DISPOSABLE) ×4 IMPLANT
KIT BASIN OR (CUSTOM PROCEDURE TRAY) ×2 IMPLANT
KIT ROOM TURNOVER OR (KITS) ×2 IMPLANT
NEEDLE 21 GA WING INFUSION (NEEDLE) ×2 IMPLANT
NEEDLE HYPO 25GX1X1/2 BEV (NEEDLE) ×2 IMPLANT
NS IRRIG 1000ML POUR BTL (IV SOLUTION) ×4 IMPLANT
PACK GENERAL/GYN (CUSTOM PROCEDURE TRAY) ×2 IMPLANT
PAD ARMBOARD 7.5X6 YLW CONV (MISCELLANEOUS) ×6 IMPLANT
PIN SAFETY STERILE (MISCELLANEOUS) ×2 IMPLANT
SET ASEPTIC TRANSFER (MISCELLANEOUS) ×2 IMPLANT
SPONGE GAUZE 4X4 12PLY (GAUZE/BANDAGES/DRESSINGS) ×2 IMPLANT
STAPLER VISISTAT 35W (STAPLE) ×2 IMPLANT
SUT MNCRL AB 4-0 PS2 18 (SUTURE) ×6 IMPLANT
SUT MON AB 3-0 SH 27 (SUTURE) ×7
SUT MON AB 3-0 SH27 (SUTURE) ×7 IMPLANT
SUT MON AB 5-0 PS2 18 (SUTURE) ×6 IMPLANT
SUT PDS AB 2-0 CT1 27 (SUTURE) IMPLANT
SUT SILK 3 0 SH 30 (SUTURE) ×6 IMPLANT
SUT VIC AB 3-0 SH 27 (SUTURE) ×3
SUT VIC AB 3-0 SH 27X BRD (SUTURE) ×3 IMPLANT
SUT VIC AB 3-0 SH 8-18 (SUTURE) ×2 IMPLANT
SUT VICRYL 4-0 PS2 18IN ABS (SUTURE) ×4 IMPLANT
SYR CONTROL 10ML LL (SYRINGE) ×2 IMPLANT
TOWEL OR 17X24 6PK STRL BLUE (TOWEL DISPOSABLE) ×4 IMPLANT
TOWEL OR 17X26 10 PK STRL BLUE (TOWEL DISPOSABLE) ×4 IMPLANT
TRAY FOLEY CATH 14FRSI W/METER (CATHETERS) ×2 IMPLANT

## 2013-04-06 NOTE — Brief Op Note (Signed)
04/06/2013  4:23 PM  PATIENT:  Tammie [name]  42 y.o. female  PRE-OPERATIVE DIAGNOSIS:  BREAST CANCER, acquired absence of right breast.   POST-OPERATIVE DIAGNOSIS:  BREAST CANCER, acquired absence of right breast.   PROCEDURE:  Procedure(s): RIGHT BREAST RECONSTRUCTION WITH LATISSIMUS MYOCUTANEOUS MUSCLE FLAP AND PLACEMENT OF TISSUE EXPANDER (Right)  SURGEON:  Surgeon(s) and Role:    * Claire Sanger, DO - Primary  PHYSICIAN ASSISTANT: Shawn Rayburn, PA  ASSISTANTS: none   ANESTHESIA:   general  EBL:  Total I/O In: 2000 [I.V.:2000] Out: 615 [Urine:600; Blood:15]  BLOOD ADMINISTERED:none  DRAINS: (3) Jackson-Pratt drain(s) with closed bulb suction in the back (2) and one in the right breast pocket.   LOCAL MEDICATIONS USED:  LIDOCAINE   SPECIMEN:  Source of Specimen:  right breast tissue  DISPOSITION OF SPECIMEN:  PATHOLOGY  COUNTS:  YES  TOURNIQUET:  * No tourniquets in log *  DICTATION: .Dragon Dictation  PLAN OF CARE: Admit to inpatient   PATIENT DISPOSITION:  PACU - hemodynamically stable.   Delay start of Pharmacological VTE agent (>24hrs) due to surgical blood loss or risk of bleeding: no

## 2013-04-06 NOTE — Anesthesia Preprocedure Evaluation (Addendum)
Anesthesia Evaluation  Patient identified by MRN, date of birth, ID band Patient awake    Reviewed: Allergy & Precautions, H&P , NPO status , Patient's Chart, lab work & pertinent test results  History of Anesthesia Complications (+) DIFFICULT IV STICK / SPECIAL LINE and history of anesthetic complications  Airway Mallampati: II TM Distance: >3 FB Neck ROM: Full    Dental  (+) Teeth Intact and Dental Advisory Given   Pulmonary pneumonia -, resolved,    Pulmonary exam normal       Cardiovascular hypertension,     Neuro/Psych  Headaches, PSYCHIATRIC DISORDERS Anxiety Depression  Neuromuscular disease    GI/Hepatic negative GI ROS, Neg liver ROS, GERD-  ,  Endo/Other  negative endocrine ROS  Renal/GU negative Renal ROS     Musculoskeletal   Abdominal   Peds  Hematology  (+) anemia ,   Anesthesia Other Findings   Reproductive/Obstetrics                         Anesthesia Physical Anesthesia Plan  ASA: II  Anesthesia Plan: General   Post-op Pain Management:    Induction: Intravenous  Airway Management Planned: Oral ETT  Additional Equipment:   Intra-op Plan:   Post-operative Plan: Extubation in OR  Informed Consent: I have reviewed the patients History and Physical, chart, labs and discussed the procedure including the risks, benefits and alternatives for the proposed anesthesia with the patient or authorized representative who has indicated his/her understanding and acceptance.   Dental advisory given  Plan Discussed with: CRNA, Anesthesiologist and Surgeon  Anesthesia Plan Comments: (Pt has hx of difficult IV access.  Multiple attempts today ultimately using ultrasound to obtain an antecubital iv which infiltrated on arrival to OR.  CVL was inserted in OR.)       Anesthesia Quick Evaluation

## 2013-04-06 NOTE — H&P (View-Only) (Signed)
This document contains confidential information from a [facility] medical record system and may be unauthenticated. Release may be made only with a valid authorization or in accordance with applicable policies of Medical Center or its affiliates. This document must be maintained in a secure manner or discarded/destroyed as required by Medical Center policy or by a confidential means such as shredding.     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 female.  HPI The patient is a 42 yrs old female here for history and physical for right breast reconstruction. She was diagnosed with right breast invasive ductal carcinoma. She underwent a right mastectomy with sentinel lymph node biopsy performed at [facility] by Dr. Janee Morn (12/2011). It was ER/PR positive, HER-2/neu negative. The path showed multifocal cancer (5 cm and 0.9 cm) with lymphovascular invasion and associated DCIS, one sentinel node was positive for metastatic disease and went on to have a lymph node dissection but all others were negative. She underwent chemotherapy treatment with Adriamycin and Cytoxan for 4 cycles, followed by paclitaxel ending 04/2012. The paclitaxel was stopped early due to neuropathies and Abraxane was started and ended 07/2012. Radiation was given by Dr. Lurline Hare and completed 10/20/2012. She is now on tamoxifen 20 mg. She has hypertension, heart murmur, arthritis, chronic back pain, neck pain, and migraines. She is 5 feet 7 inches tall, weighs 158 pounds  and wore a 34 A-B bra. She underwent a breast reduction in 2003. She has several incisions on her abdomen from lap surgery. Her abdomen is small.  The following portions of the patient's history were reviewed and updated as appropriate: allergies, current medications, past family history, past medical history, past social history, past surgical history and problem list.  Review of Systems  All other systems reviewed and are negative.   Objective:    Physical Exam  Constitutional: She is oriented to person, place, and time. She appears well-developed and well-nourished. No distress.  HENT:   Head: Normocephalic and atraumatic.   Nose: Nose normal.   Mouth/Throat: Oropharynx is clear and moist.  Eyes: Conjunctivae and EOM are normal. Pupils are equal, round, and reactive to light.  Neck: Normal range of motion. Neck supple. No tracheal deviation present. No thyromegaly present.  Cardiovascular: Normal rate, regular rhythm, normal heart sounds and intact distal pulses.  Exam reveals no gallop and no friction rub.    No murmur heard. Pulmonary/Chest: Effort normal and breath sounds normal. No stridor. No respiratory distress. She has no wheezes.  Right mastectomy scarring without signs of infection  Abdominal: Soft. Bowel sounds are normal.  Musculoskeletal: Normal range of motion. She exhibits no edema and no tenderness.  Neurological: She is alert and oriented to person, place, and time. No cranial nerve deficit. She exhibits normal muscle tone. Coordination normal.  Skin: Skin is warm and dry. No rash noted. No erythema.  Psychiatric: She has a normal mood and affect. Her behavior is normal. Judgment and thought content normal.   Assessment:   1.  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  Breast cancer, right [facility])         Plan:      The patient would like to undergo a right breast reconstruction using the right latissimus muscle with expander placement.  The plan will be to  place an implant on the left at the time of the implant placement on the right. The procedure, possible risks, benefits and complications were discussed with the patient and she desires to proceed and consent was obtained.       Medications Ordered This Encounter      HYDROcodone-acetaminophen (NORCO) 5-325 mg per tablet Take 1 tablet by mouth every 6 (six) hours as needed for up to 10 days for Pain.    cephalexin (KEFLEX) 500 MG capsule Take 1 capsule (500 mg total) by mouth 4 times daily.    diazepam (VALIUM) 2 MG tablet Take 1 tablet (2 mg total) by mouth every 6 (six) hours as needed for up to 10 days for Anxiety.

## 2013-04-06 NOTE — Interval H&P Note (Signed)
History and Physical Interval Note:  04/06/2013 9:20 AM  Tammie [name]  has presented today for surgery, with the diagnosis of BREAST CANCER  The various methods of treatment have been discussed with the patient and family. After consideration of risks, benefits and other options for treatment, the patient has consented to  Procedure(s): RIGHT BREAST RECONSTRUCTION WITH LATISSIMUS MYOCUTANEOUS MUSCLE FLAP AND PLACEMENT OF TISSUE EXPANDER (Right) as a surgical intervention .  The patient's history has been reviewed, patient examined, no change in status, stable for surgery.  I have reviewed the patient's chart and labs.  Questions were answered to the patient's satisfaction.     SANGER,CLAIRE

## 2013-04-06 NOTE — Transfer of Care (Signed)
Immediate Anesthesia Transfer of Care Note  Patient: Tammie [name]  Procedure(s) Performed: Procedure(s): RIGHT BREAST RECONSTRUCTION WITH LATISSIMUS MYOCUTANEOUS MUSCLE FLAP AND PLACEMENT OF TISSUE EXPANDER (Right)  Patient Location: PACU  Anesthesia Type:General  Level of Consciousness: awake, alert , oriented and patient cooperative  Airway & Oxygen Therapy: Patient Spontanous Breathing and Patient connected to nasal cannula oxygen  Post-op Assessment: Report given to PACU RN, Post -op Vital signs reviewed and stable and Patient moving all extremities X 4  Post vital signs: Reviewed and stable  Complications: No apparent anesthesia complications

## 2013-04-06 NOTE — Op Note (Addendum)
DATE OF OPERATION: 04/06/2013  LOCATION: [facility]  PREOPERATIVE DIAGNOSIS: Breast Cancer s/p right mastectomy   POSTOPERATIVE DIAGNOSIS: Same  PROCEDURE: 1. Latissimus flap on the right to reconstruct the breast CPT 19361 2. Tissue expander placement CPT 19357  SURGEON: Alan Ripper Sanger  ASSISTANT: Shawn Rayburn, PA  EBL: 100 cc  SPECIMEN: None  DRAINS: 3 total 19 blake round drains  CONDITION: Stable  COMPLICATIONS: None  INDICATION: The patient, Tammie [name], is a 41 y.o. female born on 1970-05-13, is here for treatment after a mastectomy.    PROCEDURE DETAILS:  Patient was seen on the morning of her surgery and marked out for her flap. She was then taken to the operating room and given a general anesthetic. She was given an IV and IV antibiotics. An adequate time out was done. She has SCD's and a foley catheter placed. She was placed into the left lateral decubitus position with all key points padded. She was then prepped and draped in the standard sterile fashion using a chloroprep. The paddle design and position was confirmed. The procedure began by incising the margins of the paddle and dissecting out until all four margins of the muscle were identified. The muscle was then released inferiorly and anteriorly and care was taken not to pick up the serratus anteriorly or the paraspinous muscles posteriorly. The flap was then raised to the scapula and released and rotated medially. Care was taken to protect the vascular pedicle throughout this portion of the procedure. The paddle and muscle looked healthy throughout. The old breast reduction scar was opened and raised the flaps on top of the pectoralis muscle as there was a reasonable amount of skin. The posterior and anterior pockets were then connected in the plane above the muscle. The muscle from the back and the skin paddle were then rotated into the chest pocket. The flap pedicle was inspected and there was no tension. The back  pocket was hemostased and Evicel placed. Two #19 blake round drains were place and secured with 3-0 Silk. The back incision was closed with buried 3-0 Vicryl, followed by 4-0 Monocryl and 5-0 Monocryl.  Dermabond and a protective dressing was applied.   The patient was then repositioned onto her back and the chest was prepped and draped with a betadine. The breast pocket was opened from the breast reduction incision. Electrocautery was used to obtain chemostasis. The muscle was then secured superiorly to the pectoralis muscle with 3-0 Monocryl. A 275 cc expander was chosen and placed under the pectoralis muscle and secured with a 3-0 Monocryl using the tabs. It was soaked in triple antibiotic solution and evacuated of air and then filled with 50 cc of sterile saline. The inferior portion of the muscle was tacked to the inframammary fold with 3-0 Vicryl.  One drain was placed on this side and secured with 3-0 Silk. The flap was then closed with 3-0 Monocryl deep, followed by 4-0 Monocryl and the skin closed with 5-0 Monocryl.  Dermabond, ABDs and a breast binder was applied.  The patient was allowed to wake up and taken to recovery room in stable condition at the end of the case. The family was notified at the end of the case as well.

## 2013-04-06 NOTE — Anesthesia Procedure Notes (Addendum)
Procedure Name: Intubation Date/Time: 04/06/2013 1:19 PM Performed by: Angelica Pou Pre-anesthesia Checklist: Patient identified, Timeout performed, Emergency Drugs available, Suction available and Patient being monitored Patient Re-evaluated:Patient Re-evaluated prior to inductionOxygen Delivery Method: Circle system utilized Preoxygenation: Pre-oxygenation with 100% oxygen Intubation Type: IV induction Ventilation: Mask ventilation without difficulty Laryngoscope Size: Mac and 3 Tube type: Oral Tube size: 7.0 mm Number of attempts: 1 Airway Equipment and Method: Stylet Placement Confirmation: ETT inserted through vocal cords under direct vision,  breath sounds checked- equal and bilateral and positive ETCO2 Secured at: 22 cm Tube secured with: Tape Dental Injury: Teeth and Oropharynx as per pre-operative assessment  Comments: Smooth IV induction by Dr Krista Blue; easy atraumatic intubation by Pasty Arch CRNA.    Pt arrived to OR and iv was noted to be infiltrated.  Pt has had difficult IV access in the past and CVL was the best option at this point.  Discussed with Dr. Kelly Splinter who asked that the CVL be placed in the left side. The patient was identified and consent obtained.  TO was performed, and full barrier precautions were used.  The skin was anesthetized with lidocaine.  Once the vein was located with the 22 ga., the wire was inserted into the vein. The insertion site was dilated and the CVL was carefully inserted and sutured in place. The patient tolerated the procedure well.  CXR was ordered for PACU.Ultrasound guidance: relevant anatomy identified, needle position confirmed, vessel patent, wire seen inside the vessel.  Images printed for the medical record.  Start: 1305 End: 1315 J. Claybon Jabs, MD

## 2013-04-06 NOTE — Preoperative (Signed)
Beta Blockers   Reason not to administer Beta Blockers:Not Applicable 

## 2013-04-07 ENCOUNTER — Inpatient Hospital Stay (HOSPITAL_COMMUNITY): Payer: BC Managed Care – PPO

## 2013-04-07 ENCOUNTER — Encounter (HOSPITAL_COMMUNITY): Payer: Self-pay | Admitting: General Practice

## 2013-04-07 LAB — BASIC METABOLIC PANEL
BUN: 4 mg/dL — ABNORMAL LOW (ref 6–23)
CO2: 23 mEq/L (ref 19–32)
Calcium: 8.7 mg/dL (ref 8.4–10.5)
Creatinine, Ser: 0.72 mg/dL (ref 0.50–1.10)
GFR calc Af Amer: >90 mL/min (ref >90)
GFR calc non Af Amer: >90 mL/min (ref >90)
Glucose, Bld: 129 mg/dL — ABNORMAL HIGH (ref 70–99)
Sodium: 135 mEq/L (ref 135–145)

## 2013-04-07 LAB — CBC
HCT: 37.1 % (ref 36.0–46.0)
MCH: 24.6 pg — ABNORMAL LOW (ref 26.0–34.0)
MCHC: 32.1 g/dL (ref 30.0–36.0)
MCV: 76.8 fL — ABNORMAL LOW (ref 78.0–100.0)
RDW: 15.6 % — ABNORMAL HIGH (ref 11.5–15.5)

## 2013-04-07 MED ORDER — TAMOXIFEN CITRATE 10 MG PO TABS
20.0000 mg | ORAL_TABLET | Freq: Every day | ORAL | Status: DC
  Administered 2013-04-07 – 2013-04-09 (×3): 20 mg via ORAL
  Filled 2013-04-07 (×3): qty 2

## 2013-04-07 MED ORDER — DIPHENHYDRAMINE HCL 25 MG PO CAPS
25.0000 mg | ORAL_CAPSULE | Freq: Four times a day (QID) | ORAL | Status: DC | PRN
  Administered 2013-04-07 – 2013-04-09 (×5): 25 mg via ORAL
  Filled 2013-04-07 (×5): qty 1

## 2013-04-07 MED ORDER — HYDROCODONE-ACETAMINOPHEN 5-325 MG PO TABS
1.0000 | ORAL_TABLET | Freq: Four times a day (QID) | ORAL | Status: DC | PRN
  Administered 2013-04-07 – 2013-04-09 (×8): 1 via ORAL
  Filled 2013-04-07 (×8): qty 1

## 2013-04-07 MED ORDER — SODIUM CHLORIDE 0.9 % IJ SOLN
10.0000 mL | INTRAMUSCULAR | Status: DC | PRN
  Administered 2013-04-09: 10 mL

## 2013-04-07 MED ORDER — OXYCODONE HCL ER 10 MG PO T12A
10.0000 mg | EXTENDED_RELEASE_TABLET | Freq: Two times a day (BID) | ORAL | Status: DC
  Administered 2013-04-07 – 2013-04-08 (×3): 10 mg via ORAL
  Filled 2013-04-07 (×3): qty 1

## 2013-04-07 MED ORDER — MORPHINE SULFATE 2 MG/ML IJ SOLN
2.0000 mg | INTRAMUSCULAR | Status: DC | PRN
  Administered 2013-04-08: 2 mg via INTRAVENOUS
  Filled 2013-04-07: qty 1

## 2013-04-07 NOTE — Progress Notes (Signed)
1 Day Post-Op  Subjective: The patient is in the room with her friend and doing well.  She is post operative day 1.  No complaints  Objective: Vital signs in last 24 hours: Temp:  [95.9 F (35.5 C)-98.9 F (37.2 C)] 98.9 F (37.2 C) (12/09 0523) Pulse Rate:  [64-92] 89 (12/09 0523) Resp:  [10-26] 17 (12/09 0523) BP: (105-171)/(64-110) 105/64 mmHg (12/09 0523) SpO2:  [2 %-100 %] 100 % (12/09 0523) FiO2 (%):  [100 %] 100 % (12/09 0456)    Intake/Output from previous day: 12/08 0701 - 12/09 0700 In: 2003.3 [I.V.:2003.3] Out: 975 [Urine:600; Drains:360; Blood:15] Intake/Output this shift:    General appearance: alert, cooperative and no distress Incision/Wound: warm and dry with good capillary refill.  Lab Results:  No results found for this basename: WBC, HGB, HCT, PLT,  in the last 72 hours BMET No results found for this basename: NA, K, CL, CO2, GLUCOSE, BUN, CREATININE, CALCIUM,  in the last 72 hours PT/INR No results found for this basename: LABPROT, INR,  in the last 72 hours ABG No results found for this basename: PHART, PCO2, PO2, HCO3,  in the last 72 hours  Studies/Results: No results found.  Anti-infectives: Anti-infectives   Start     Dose/Rate Route Frequency Ordered Stop   04/06/13 2200  Ampicillin-Sulbactam (UNASYN) 3 g in sodium chloride 0.9 % 100 mL IVPB     3 g 100 mL/hr over 60 Minutes Intravenous Every 6 hours 04/06/13 1637     04/06/13 1356  polymyxin B 500,000 Units, bacitracin 50,000 Units in sodium chloride irrigation 0.9 % 500 mL irrigation  Status:  Discontinued       As needed 04/06/13 1357 04/06/13 1635   04/06/13 0600  ceFAZolin (ANCEF) IVPB 2 g/50 mL premix     2 g 100 mL/hr over 30 Minutes Intravenous On call to O.R. 04/05/13 1247 04/06/13 1320      Assessment/Plan: s/p Procedure(s): RIGHT BREAST RECONSTRUCTION WITH LATISSIMUS MYOCUTANEOUS MUSCLE FLAP AND PLACEMENT OF TISSUE EXPANDER (Right) Discontinue PCA and add prn pain meds.   Increase activity.  LOS: 1 day    [facility] 04/07/2013

## 2013-04-07 NOTE — Anesthesia Postprocedure Evaluation (Signed)
Anesthesia Post Note  Patient: Tammie [name]  Procedure(s) Performed: Procedure(s) (LRB): RIGHT BREAST RECONSTRUCTION WITH LATISSIMUS MYOCUTANEOUS MUSCLE FLAP AND PLACEMENT OF TISSUE EXPANDER (Right)  Anesthesia type: general  Patient location: Pt room  Post pain: Pain level controlled  Post assessment: Patient's Cardiovascular Status Stable  Last Vitals:  Filed Vitals:   04/07/13 0756  BP: 134/65  Pulse: 88  Temp: 37.1 C  Resp: 18    Post vital signs: Reviewed and stable  Level of consciousness: alert  Complications: No apparent anesthesia complications

## 2013-04-08 MED ORDER — OXYCODONE HCL ER 15 MG PO T12A
15.0000 mg | EXTENDED_RELEASE_TABLET | Freq: Two times a day (BID) | ORAL | Status: DC
  Administered 2013-04-08 – 2013-04-09 (×2): 15 mg via ORAL
  Filled 2013-04-08 (×2): qty 1

## 2013-04-08 NOTE — Progress Notes (Signed)
2 Days Post-Op  Subjective: The patient is post op day 2 and doing well.  Her pain is improving.  Objective: Vital signs in last 24 hours: Temp:  [97.8 F (36.6 C)-99.1 F (37.3 C)] 97.8 F (36.6 C) (12/10 1350) Pulse Rate:  [79-94] 94 (12/10 1350) Resp:  [16-18] 16 (12/10 1350) BP: (93-120)/(47-57) 120/57 mmHg (12/10 1350) SpO2:  [94 %-99 %] 98 % (12/10 1350) Last BM Date: 04/06/13  Intake/Output from previous day: 12/09 0701 - 12/10 0700 In: 3590 [P.O.:590; I.V.:2500; IV Piggyback:500] Out: 3690 [Urine:3400; Drains:290] Intake/Output this shift: Total I/O In: 240 [P.O.:240] Out: -   General appearance: alert, cooperative and no distress Incision/Wound:flaps are warm and dry and no sign of infection.  Lab Results:   Recent Labs  04/07/13 0845  WBC 7.8  HGB 11.9*  HCT 37.1  PLT 196   BMET  Recent Labs  04/07/13 0845  NA 135  K 4.0  CL 100  CO2 23  GLUCOSE 129*  BUN 4*  CREATININE 0.72  CALCIUM 8.7   PT/INR No results found for this basename: LABPROT, INR,  in the last 72 hours ABG No results found for this basename: PHART, PCO2, PO2, HCO3,  in the last 72 hours  Studies/Results: Dg Chest Port 1 View  04/07/2013   CLINICAL DATA:  Status post central line placement  EXAM: PORTABLE CHEST - 1 VIEW  COMPARISON:  11/08/2012  FINDINGS: There are multiple surgical drains identified on the right. Postsurgical changes consistent with an tissue expander are noted on the right is well. A left central venous line is seen with catheter tip superimposed over the mid superior vena cava. No pneumothorax is noted. The patient is somewhat rotated to the left. No definitive infiltrate is seen.  IMPRESSION: No evidence of pneumothorax following central line placement.  Postsurgical changes on the right as described.   Electronically Signed   By: Alcide Clever M.D.   On: 04/07/2013 10:37    Anti-infectives: Anti-infectives   Start     Dose/Rate Route Frequency Ordered Stop   04/06/13 2200  Ampicillin-Sulbactam (UNASYN) 3 g in sodium chloride 0.9 % 100 mL IVPB     3 g 100 mL/hr over 60 Minutes Intravenous Every 6 hours 04/06/13 1637     04/06/13 1356  polymyxin B 500,000 Units, bacitracin 50,000 Units in sodium chloride irrigation 0.9 % 500 mL irrigation  Status:  Discontinued       As needed 04/06/13 1357 04/06/13 1635   04/06/13 0600  ceFAZolin (ANCEF) IVPB 2 g/50 mL premix     2 g 100 mL/hr over 30 Minutes Intravenous On call to O.R. 04/05/13 1247 04/06/13 1320      Assessment/Plan: s/p Procedure(s): RIGHT BREAST RECONSTRUCTION WITH LATISSIMUS MYOCUTANEOUS MUSCLE FLAP AND PLACEMENT OF TISSUE EXPANDER (Right) Increase activity and stop PCA.  LOS: 2 days    [facility] 04/08/2013

## 2013-04-08 NOTE — Progress Notes (Signed)
Pt has hx of difficult IV access.  Multiple attempts today ultimately using ultrasound to obtain an antecubital iv which infiltrated on arrival to OR.  CVL was inserted in OR. Would recommend Ultrasound or CVL if surgery needed in future.

## 2013-04-09 ENCOUNTER — Encounter (HOSPITAL_COMMUNITY): Payer: Self-pay | Admitting: Plastic Surgery

## 2013-04-09 NOTE — Discharge Summary (Signed)
Physician Discharge Summary  Patient ID: Tammie [name] MRN: [id number] DOB/AGE: 06/14/1970 42 y.o.  Admit date: 04/06/2013 Discharge date: 04/09/2013  Admission Diagnoses:  Discharge Diagnoses:  Active Problems:   Acquired absence of breast and absent nipple   Discharged Condition: good  Hospital Course: The patient was taken to the OR and underwent a right breast reconstruction with a latissimus muscle flap and expander placement.  She has done well throughout her stay.  She was on the PCA for pain management for the first night and switched to oral and IV pain meds.  She had some itching which resolved with Benadryl.  She was walking, eating and pain well controlled at the time of discharge.  Consults: None  Significant Diagnostic Studies: none  Treatments: surgery: right breast reconstruction  Discharge Exam: Blood pressure 116/59, pulse 80, temperature 98.5 F (36.9 C), temperature source Oral, resp. rate 15, height 5\' 7"  (1.702 m), weight 72.712 kg (160 lb 4.8 oz), last menstrual period 01/03/2011, SpO2 100.00%. General appearance: alert and cooperative  Disposition: 01-Home or Self Care   Future Appointments Provider Department Dept Phone   05/25/2013 9:15 AM Victorino December, MD [facility] MEDICAL ONCOLOGY [phone]   05/26/2013 9:30 AM Waymon Budge, MD [facility] [phone]   06/01/2013 9:30 AM Sandford Craze, NP [facility] [phone]       Medication List    STOP taking these medications       montelukast 10 MG tablet  Commonly known as:  SINGULAIR      TAKE these medications       azelastine 0.05 % ophthalmic solution  Commonly known as:  OPTIVAR  Place 1 drop into both eyes 2 (two) times daily.     Azelastine-Fluticasone 137-50 MCG/ACT Susp  Commonly known as:  DYMISTA  Place 1-2 sprays into the nose daily.     citalopram 20 MG tablet  Commonly known as:  CELEXA  Take 1 tablet (20 mg total)  by mouth daily.     ferrous sulfate 325 (65 FE) MG tablet  Take 325 mg by mouth 3 (three) times daily with meals.     gabapentin 300 MG capsule  Commonly known as:  NEURONTIN  Take 1 capsule (300 mg total) by mouth 3 (three) times daily.     levocetirizine 5 MG tablet  Commonly known as:  XYZAL  Take 1 tablet (5 mg total) by mouth every evening. [facility], with 3 refills per Dr.Wentworth     LORazepam 0.5 MG tablet  Commonly known as:  ATIVAN  Take 1 tablet (0.5 mg total) by mouth every 6 (six) hours as needed (Nausea or vomiting).     non-metallic deodorant Misc  Commonly known as:  ALRA  Apply 1 application topically daily. Apply after rad tx daily     omeprazole 20 MG capsule  Commonly known as:  PRILOSEC  Take 20 mg by mouth daily.     rizatriptan 10 MG tablet  Commonly known as:  MAXALT  Take 10 mg by mouth as needed. May repeat in 2 hours if needed     senna 8.6 MG tablet  Commonly known as:  SENOKOT  Take 1 tablet by mouth daily as needed.     tamoxifen 20 MG tablet  Commonly known as:  NOLVADEX  Take 20 mg by mouth daily.     venlafaxine XR 75 MG 24 hr capsule  Commonly known as:  EFFEXOR XR  Take 1 capsule (75 mg total) by mouth daily.           Follow-up Information   Follow up with [facility], DO In 1 week.   Specialty:  Plastic Surgery   Contact information:   [address] [place] [address] [phone]       Signed: Wayland Denis 04/09/2013, 11:15 AM

## 2013-04-09 NOTE — Progress Notes (Signed)
CSW received a consult for shower chair. CSW will inform CM of the need.     Leron Croak [facility]  [address];  [phone] Phone: [phone]

## 2013-04-09 NOTE — Discharge Planning (Signed)
Patient discharged home in stable condition. Verbalizes understanding of all discharge instructions, including home medications and follow up appointments. 

## 2013-04-28 ENCOUNTER — Other Ambulatory Visit: Payer: Self-pay | Admitting: Oncology

## 2013-04-28 DIAGNOSIS — C50511 Malignant neoplasm of lower-outer quadrant of right female breast: Secondary | ICD-10-CM

## 2013-05-04 ENCOUNTER — Telehealth: Payer: Self-pay | Admitting: Family

## 2013-05-04 DIAGNOSIS — C50911 Malignant neoplasm of unspecified site of right female breast: Secondary | ICD-10-CM

## 2013-05-04 DIAGNOSIS — Z9010 Acquired absence of unspecified breast and nipple: Secondary | ICD-10-CM

## 2013-05-04 NOTE — Telephone Encounter (Signed)
Patient states that she has a new insurance that requires a referral to all other physicians that she sees. She states that she has upcoming appointments with her oncologist and plastic surgeon and would like referral sent to them.

## 2013-05-25 ENCOUNTER — Ambulatory Visit: Payer: BC Managed Care – PPO | Admitting: Oncology

## 2013-05-26 ENCOUNTER — Ambulatory Visit: Payer: BC Managed Care – PPO | Admitting: Internal Medicine

## 2013-06-01 ENCOUNTER — Ambulatory Visit (INDEPENDENT_AMBULATORY_CARE_PROVIDER_SITE_OTHER): Payer: No Typology Code available for payment source | Admitting: Family

## 2013-06-01 ENCOUNTER — Encounter: Payer: Self-pay | Admitting: Family

## 2013-06-01 VITALS — BP 104/74 | HR 83 | Temp 97.9°F | Ht 67.5 in | Wt 159.1 lb

## 2013-06-01 DIAGNOSIS — C50919 Malignant neoplasm of unspecified site of unspecified female breast: Secondary | ICD-10-CM

## 2013-06-01 DIAGNOSIS — F32A Depression, unspecified: Secondary | ICD-10-CM

## 2013-06-01 DIAGNOSIS — F3289 Other specified depressive episodes: Secondary | ICD-10-CM

## 2013-06-01 DIAGNOSIS — N760 Acute vaginitis: Secondary | ICD-10-CM

## 2013-06-01 DIAGNOSIS — R739 Hyperglycemia, unspecified: Secondary | ICD-10-CM | POA: Insufficient documentation

## 2013-06-01 DIAGNOSIS — R7309 Other abnormal glucose: Secondary | ICD-10-CM

## 2013-06-01 DIAGNOSIS — F329 Major depressive disorder, single episode, unspecified: Secondary | ICD-10-CM

## 2013-06-01 DIAGNOSIS — R109 Unspecified abdominal pain: Secondary | ICD-10-CM

## 2013-06-01 DIAGNOSIS — N898 Other specified noninflammatory disorders of vagina: Secondary | ICD-10-CM

## 2013-06-01 MED ORDER — MONTELUKAST SODIUM 10 MG PO TABS: 10.0000 mg | ORAL_TABLET | Freq: Every day | ORAL | Status: DC

## 2013-06-01 MED ORDER — RANITIDINE HCL 75 MG PO TABS: 75.0000 mg | ORAL_TABLET | Freq: Two times a day (BID) | ORAL | Status: DC

## 2013-06-01 MED ORDER — CITALOPRAM HYDROBROMIDE 20 MG PO TABS: 20.0000 mg | ORAL_TABLET | Freq: Every day | ORAL | Status: DC
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
## 2013-06-01 NOTE — Progress Notes (Signed)
Pre visit review using our clinic review tool, if applicable. No additional management support is needed unless otherwise documented below in the visit note. 

## 2013-06-01 NOTE — Assessment & Plan Note (Signed)
Clinically stable. Plan A1C next visit.

## 2013-06-01 NOTE — Patient Instructions (Signed)
Please restart citalopram.  If you are still feeling "flat" restart effexor in a few weeks. Follow up in 3 months, sooner if problems/concerns.

## 2013-06-01 NOTE — Assessment & Plan Note (Signed)
Deteriorated since she discontinued effexor and citalopram.  She wants to be on the least amount of meds possible.  I advised her to resume citalopram at 1/2 tab daily x 1 week, then increase for full tab week two. If still not feeling well in a few weeks, she can add back in effexor. She is to contact us if symptoms worsen or if symptoms do not improve.

## 2013-06-01 NOTE — Assessment & Plan Note (Signed)
Patient is on tamoxifen which is likely leading to some vaginal atrophy. She is not a candidate for estrogen therapy. Advised pt to try replens vaginal moisturizer + good vaginal lubricant prior to intercourse.

## 2013-06-01 NOTE — Assessment & Plan Note (Signed)
Resolved. Is likely related to constipation.

## 2013-06-02 ENCOUNTER — Ambulatory Visit: Payer: BC Managed Care – PPO | Admitting: Internal Medicine

## 2013-06-02 LAB — GC/CHLAMYDIA PROBE AMP
CT PROBE, AMP APTIMA: NEGATIVE
GC PROBE AMP APTIMA: NEGATIVE

## 2013-06-02 LAB — WET PREP BY MOLECULAR PROBE
CANDIDA SPECIES: POSITIVE — AB
Gardnerella vaginalis: NEGATIVE
Trichomonas vaginosis: NEGATIVE

## 2013-06-03 ENCOUNTER — Telehealth: Payer: Self-pay | Admitting: Family

## 2013-06-03 MED ORDER — FLUCONAZOLE 150 MG PO TABS: 150.0000 mg | ORAL_TABLET | Freq: Once | ORAL | Status: DC

## 2013-06-03 NOTE — Telephone Encounter (Signed)
Notified pt and she states she has already tried the monistat 7 prior to coming to [place]. Wants to know if she can temporarily stop whatever medication may interact with diflucan?  Please advise.

## 2013-06-03 NOTE — Telephone Encounter (Signed)
Notified pt, she states she has not restarted citalopram yet. Pt voices understanding re: below instructions and Rx sent.

## 2013-06-03 NOTE — Telephone Encounter (Signed)
The medication with potential interaction is citalopram. She should hold for 3 days after taking last dose of diflucan.  I believe she was about to restart this med so she can delay restart.

## 2013-06-03 NOTE — Telephone Encounter (Signed)
Wet prep shows yeast.  Due to drug interaction, with diflucan, I recommend otc monistat 7

## 2013-06-08 ENCOUNTER — Encounter (HOSPITAL_BASED_OUTPATIENT_CLINIC_OR_DEPARTMENT_OTHER): Payer: Self-pay | Admitting: *Deleted

## 2013-06-08 NOTE — Progress Notes (Signed)
No labs needed

## 2013-06-09 ENCOUNTER — Other Ambulatory Visit: Payer: Self-pay | Admitting: Plastic Surgery

## 2013-06-09 DIAGNOSIS — N6489 Other specified disorders of breast: Secondary | ICD-10-CM

## 2013-06-09 DIAGNOSIS — Z9010 Acquired absence of unspecified breast and nipple: Secondary | ICD-10-CM

## 2013-06-09 NOTE — H&P (Signed)
  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 female.  HPI The patient is a 42 yrs old female here for follow up following for right breast reconstruction with right latissimus flap and expander placement. She was diagnosed with right breast invasive ductal carcinoma. She underwent a right mastectomy with sentinel lymph node biopsy performed at [facility] by Dr. Nancy Damore (12/2011). It was ER/PR positive, HER-2/neu negative. The path showed multifocal cancer (5 cm and 0.9 cm) with lymphovascular invasion and associated DCIS, one sentinel node was positive for metastatic disease and went on to have a lymph node dissection but all others were negative. She underwent chemotherapy treatment. Radiation was given by Dr. Stacy Wentworth and completed 10/20/2012. She is on tamoxifen 20 mg. She has hypertension, heart murmur, arthritis, chronic back pain, neck pain, and migraines. She is 5 feet 7 inches tall, weighs 158 pounds and wore a 34 A-B bra. She underwent a breast reduction in 2003. She has several incisions on her abdomen from lap surgery. All incisions are clean, dry and intact and without signs of infection. The flap is viable. The right expander has 220 cc of saline. She is pleased with her size.  She would like an implant placed on the left for symmetry.   The following portions of the patient's history were reviewed and updated as appropriate: allergies, current medications, past family history, past medical history, past social history, past  surgical history and problem list.  Review of Systems  All other systems reviewed and are negative.     Objective:    Physical Exam  Constitutional: She appears well-developed and well-nourished.  HENT:   Head: Normocephalic and atraumatic.  Eyes: Conjunctivae and EOM are normal. Pupils are equal, round, and reactive to light.  Pulmonary/Chest: Effort normal.  Musculoskeletal: Normal range of motion.  Neurological: She is alert.  Skin: Skin is warm.  Psychiatric: She has a normal mood and affect. Her behavior is normal. Thought content normal.      Assessment:   1.  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    

## 2013-06-11 ENCOUNTER — Encounter (HOSPITAL_BASED_OUTPATIENT_CLINIC_OR_DEPARTMENT_OTHER): Payer: Self-pay | Admitting: Plastic Surgery

## 2013-06-11 ENCOUNTER — Ambulatory Visit (HOSPITAL_BASED_OUTPATIENT_CLINIC_OR_DEPARTMENT_OTHER)
Admission: RE | Admit: 2013-06-11 | Discharge: 2013-06-11 | Disposition: A | Discharge status: Home / Self Care | Payer: 59 | Source: Ambulatory Visit | Attending: Plastic Surgery | Admitting: Plastic Surgery

## 2013-06-11 ENCOUNTER — Encounter (HOSPITAL_BASED_OUTPATIENT_CLINIC_OR_DEPARTMENT_OTHER)
Admission: RE | Disposition: A | Discharge status: Home / Self Care | Payer: Self-pay | Source: Ambulatory Visit | Attending: Plastic Surgery

## 2013-06-11 ENCOUNTER — Ambulatory Visit (HOSPITAL_BASED_OUTPATIENT_CLINIC_OR_DEPARTMENT_OTHER): Payer: 59 | Admitting: Anesthesiology

## 2013-06-11 ENCOUNTER — Encounter (HOSPITAL_BASED_OUTPATIENT_CLINIC_OR_DEPARTMENT_OTHER): Payer: 59 | Admitting: Anesthesiology

## 2013-06-11 DIAGNOSIS — M129 Arthropathy, unspecified: Secondary | ICD-10-CM | POA: Insufficient documentation

## 2013-06-11 DIAGNOSIS — Z9010 Acquired absence of unspecified breast and nipple: Secondary | ICD-10-CM | POA: Diagnosis not present

## 2013-06-11 DIAGNOSIS — G43909 Migraine, unspecified, not intractable, without status migrainosus: Secondary | ICD-10-CM | POA: Diagnosis not present

## 2013-06-11 DIAGNOSIS — Z421 Encounter for breast reconstruction following mastectomy: Secondary | ICD-10-CM | POA: Diagnosis present

## 2013-06-11 DIAGNOSIS — G8929 Other chronic pain: Secondary | ICD-10-CM | POA: Insufficient documentation

## 2013-06-11 DIAGNOSIS — I10 Essential (primary) hypertension: Secondary | ICD-10-CM | POA: Insufficient documentation

## 2013-06-11 DIAGNOSIS — N651 Disproportion of reconstructed breast: Secondary | ICD-10-CM | POA: Diagnosis not present

## 2013-06-11 DIAGNOSIS — R011 Cardiac murmur, unspecified: Secondary | ICD-10-CM | POA: Diagnosis not present

## 2013-06-11 DIAGNOSIS — Z853 Personal history of malignant neoplasm of breast: Secondary | ICD-10-CM | POA: Insufficient documentation

## 2013-06-11 DIAGNOSIS — M542 Cervicalgia: Secondary | ICD-10-CM | POA: Diagnosis not present

## 2013-06-11 DIAGNOSIS — N6489 Other specified disorders of breast: Secondary | ICD-10-CM

## 2013-06-11 DIAGNOSIS — M549 Dorsalgia, unspecified: Secondary | ICD-10-CM | POA: Insufficient documentation

## 2013-06-11 HISTORY — PX: BREAST ENHANCEMENT SURGERY: SHX7

## 2013-06-11 HISTORY — PX: REMOVAL OF TISSUE EXPANDER AND PLACEMENT OF IMPLANT: SHX6457

## 2013-06-11 SURGERY — REMOVAL, TISSUE EXPANDER, BREAST, WITH IMPLANT INSERTION
Anesthesia: General | Site: Breast | Laterality: Right

## 2013-06-11 MED ORDER — SUFENTANIL CITRATE 50 MCG/ML IV SOLN
INTRAVENOUS | Status: DC | PRN
  Administered 2013-06-11: 20 ug via INTRAVENOUS

## 2013-06-11 MED ORDER — CEFAZOLIN SODIUM-DEXTROSE 2-3 GM-% IV SOLR
2.0000 g | INTRAVENOUS | Status: AC
  Administered 2013-06-11: 2 g via INTRAVENOUS

## 2013-06-11 MED ORDER — LIDOCAINE-EPINEPHRINE 1 %-1:100000 IJ SOLN
INTRAMUSCULAR | Status: DC | PRN
  Administered 2013-06-11: 10 mL

## 2013-06-11 MED ORDER — OXYCODONE HCL 5 MG PO TABS: 5.0000 mg | ORAL_TABLET | Freq: Once | ORAL | Status: DC | PRN

## 2013-06-11 MED ORDER — OXYCODONE HCL 5 MG/5ML PO SOLN: 5.0000 mg | Freq: Once | ORAL | Status: DC | PRN

## 2013-06-11 MED ORDER — SUFENTANIL CITRATE 50 MCG/ML IV SOLN
INTRAVENOUS | Status: AC
  Filled 2013-06-11: qty 1

## 2013-06-11 MED ORDER — BUPIVACAINE-EPINEPHRINE PF 0.25-1:200000 % IJ SOLN
INTRAMUSCULAR | Status: AC
  Filled 2013-06-11: qty 30

## 2013-06-11 MED ORDER — CEPHALEXIN 500 MG PO CAPS: 500.0000 mg | ORAL_CAPSULE | Freq: Four times a day (QID) | ORAL | Status: DC

## 2013-06-11 MED ORDER — HYDROMORPHONE HCL PF 1 MG/ML IJ SOLN
INTRAMUSCULAR | Status: AC
  Filled 2013-06-11: qty 1

## 2013-06-11 MED ORDER — FENTANYL CITRATE 0.05 MG/ML IJ SOLN: 50.0000 ug | INTRAMUSCULAR | Status: DC | PRN

## 2013-06-11 MED ORDER — HYDROMORPHONE HCL PF 1 MG/ML IJ SOLN
0.2500 mg | INTRAMUSCULAR | Status: DC | PRN
  Administered 2013-06-11 (×3): 0.5 mg via INTRAVENOUS

## 2013-06-11 MED ORDER — LIDOCAINE HCL (PF) 1 % IJ SOLN
INTRAMUSCULAR | Status: AC
  Filled 2013-06-11: qty 60

## 2013-06-11 MED ORDER — LIDOCAINE-EPINEPHRINE 1 %-1:100000 IJ SOLN
INTRAMUSCULAR | Status: AC
  Filled 2013-06-11: qty 1

## 2013-06-11 MED ORDER — CEFAZOLIN SODIUM-DEXTROSE 2-3 GM-% IV SOLR
INTRAVENOUS | Status: AC
  Filled 2013-06-11: qty 50

## 2013-06-11 MED ORDER — MIDAZOLAM HCL 2 MG/2ML IJ SOLN: 1.0000 mg | INTRAMUSCULAR | Status: DC | PRN

## 2013-06-11 MED ORDER — DEXAMETHASONE SODIUM PHOSPHATE 4 MG/ML IJ SOLN
INTRAMUSCULAR | Status: DC | PRN
  Administered 2013-06-11: 10 mg via INTRAVENOUS

## 2013-06-11 MED ORDER — MIDAZOLAM HCL 2 MG/2ML IJ SOLN
INTRAMUSCULAR | Status: AC
  Filled 2013-06-11: qty 2

## 2013-06-11 MED ORDER — PROPOFOL 10 MG/ML IV BOLUS
INTRAVENOUS | Status: DC | PRN
  Administered 2013-06-11: 200 mg via INTRAVENOUS

## 2013-06-11 MED ORDER — HYDROCODONE-ACETAMINOPHEN 5-325 MG PO TABS: 1.0000 | ORAL_TABLET | Freq: Four times a day (QID) | ORAL | Status: DC | PRN

## 2013-06-11 MED ORDER — LIDOCAINE HCL (CARDIAC) 20 MG/ML IV SOLN
INTRAVENOUS | Status: DC | PRN
  Administered 2013-06-11: 75 mg via INTRAVENOUS

## 2013-06-11 MED ORDER — SODIUM CHLORIDE 0.9 % IR SOLN
Status: DC | PRN
  Administered 2013-06-11: 13:00:00

## 2013-06-11 MED ORDER — MIDAZOLAM HCL 5 MG/5ML IJ SOLN
INTRAMUSCULAR | Status: DC | PRN
  Administered 2013-06-11: 2 mg via INTRAVENOUS

## 2013-06-11 MED ORDER — EPINEPHRINE HCL 1 MG/ML IJ SOLN
INTRAMUSCULAR | Status: AC
  Filled 2013-06-11: qty 1

## 2013-06-11 MED ORDER — SUCCINYLCHOLINE CHLORIDE 20 MG/ML IJ SOLN
INTRAMUSCULAR | Status: DC | PRN
  Administered 2013-06-11: 100 mg via INTRAVENOUS

## 2013-06-11 MED ORDER — ONDANSETRON HCL 4 MG/2ML IJ SOLN
INTRAMUSCULAR | Status: DC | PRN
  Administered 2013-06-11: 4 mg via INTRAVENOUS

## 2013-06-11 MED ORDER — DIAZEPAM 2 MG PO TABS: 2.0000 mg | ORAL_TABLET | Freq: Three times a day (TID) | ORAL | Status: DC | PRN

## 2013-06-11 MED ORDER — ONDANSETRON HCL 4 MG/2ML IJ SOLN: 4.0000 mg | Freq: Once | INTRAMUSCULAR | Status: DC | PRN

## 2013-06-11 MED ORDER — LACTATED RINGERS IV SOLN
INTRAVENOUS | Status: DC
  Administered 2013-06-11 (×2): via INTRAVENOUS

## 2013-06-11 SURGICAL SUPPLY — 83 items
BAG DECANTER FOR FLEXI CONT (MISCELLANEOUS) ×4 IMPLANT
BINDER BREAST LRG (GAUZE/BANDAGES/DRESSINGS) ×4 IMPLANT
BINDER BREAST MEDIUM (GAUZE/BANDAGES/DRESSINGS) IMPLANT
BINDER BREAST XLRG (GAUZE/BANDAGES/DRESSINGS) IMPLANT
BINDER BREAST XXLRG (GAUZE/BANDAGES/DRESSINGS) IMPLANT
BIOPATCH RED 1 DISK 7.0 (GAUZE/BANDAGES/DRESSINGS) IMPLANT
BLADE HEX COATED 2.75 (ELECTRODE) ×4 IMPLANT
BLADE SURG 10 STRL SS (BLADE) IMPLANT
BLADE SURG 15 STRL LF DISP TIS (BLADE) ×3 IMPLANT
BLADE SURG 15 STRL SS (BLADE) ×1
BNDG GAUZE ELAST 4 BULKY (GAUZE/BANDAGES/DRESSINGS) IMPLANT
CANISTER LIPO FAT HARVEST (MISCELLANEOUS) IMPLANT
CANISTER SUCT 1200ML W/VALVE (MISCELLANEOUS) ×4 IMPLANT
CANNULA ASPIRATION (CANNULA) IMPLANT
CHLORAPREP W/TINT 26ML (MISCELLANEOUS) ×4 IMPLANT
CORDS BIPOLAR (ELECTRODE) IMPLANT
COVER MAYO STAND STRL (DRAPES) ×4 IMPLANT
COVER TABLE BACK 60X90 (DRAPES) ×4 IMPLANT
DECANTER SPIKE VIAL GLASS SM (MISCELLANEOUS) IMPLANT
DERMABOND ADVANCED (GAUZE/BANDAGES/DRESSINGS) ×1
DERMABOND ADVANCED .7 DNX12 (GAUZE/BANDAGES/DRESSINGS) ×3 IMPLANT
DRAIN CHANNEL 19F RND (DRAIN) IMPLANT
DRAPE LAPAROSCOPIC ABDOMINAL (DRAPES) ×4 IMPLANT
DRSG TEGADERM 2-3/8X2-3/4 SM (GAUZE/BANDAGES/DRESSINGS) IMPLANT
ELECT BLADE 4.0 EZ CLEAN MEGAD (MISCELLANEOUS) ×4
ELECT BLADE 6.5 .24CM SHAFT (ELECTRODE) IMPLANT
ELECT REM PT RETURN 9FT ADLT (ELECTROSURGICAL) ×4
ELECTRODE BLDE 4.0 EZ CLN MEGD (MISCELLANEOUS) ×3 IMPLANT
ELECTRODE REM PT RTRN 9FT ADLT (ELECTROSURGICAL) ×3 IMPLANT
EVACUATOR SILICONE 100CC (DRAIN) IMPLANT
FILTER LIPOSUCTION (MISCELLANEOUS) IMPLANT
GLOVE BIO SURGEON STRL SZ 6.5 (GLOVE) ×16 IMPLANT
GLOVE BIO SURGEON STRL SZ7.5 (GLOVE) ×4 IMPLANT
GLOVE BIOGEL PI IND STRL 8 (GLOVE) ×3 IMPLANT
GLOVE BIOGEL PI INDICATOR 8 (GLOVE) ×1
GLOVE SURG SS PI 7.0 STRL IVOR (GLOVE) ×4 IMPLANT
GOWN STRL REUS W/ TWL LRG LVL3 (GOWN DISPOSABLE) ×9 IMPLANT
GOWN STRL REUS W/TWL LRG LVL3 (GOWN DISPOSABLE) ×3
IMPL GEL SMOOTH MP 125CC (Breast) ×3 IMPLANT
IMPL SILICONE HI PROF 250CC (Breast) ×3 IMPLANT
IMPLANT GEL SMOOTH MP 125CC (Breast) ×4 IMPLANT
IMPLANT SILICONE HI PROF 250CC (Breast) ×4 IMPLANT
IV NS 1000ML (IV SOLUTION)
IV NS 1000ML BAXH (IV SOLUTION) IMPLANT
IV NS 500ML (IV SOLUTION)
IV NS 500ML BAXH (IV SOLUTION) IMPLANT
KIT FILL SYSTEM UNIVERSAL (SET/KITS/TRAYS/PACK) ×4 IMPLANT
LINER CANISTER 1000CC FLEX (MISCELLANEOUS) IMPLANT
NDL SAFETY ECLIPSE 18X1.5 (NEEDLE) ×3 IMPLANT
NEEDLE HYPO 18GX1.5 SHARP (NEEDLE) ×1
NEEDLE HYPO 25X1 1.5 SAFETY (NEEDLE) ×4 IMPLANT
NEEDLE SPNL 18GX3.5 QUINCKE PK (NEEDLE) ×4 IMPLANT
NS IRRIG 1000ML POUR BTL (IV SOLUTION) IMPLANT
PACK BASIN DAY SURGERY FS (CUSTOM PROCEDURE TRAY) ×4 IMPLANT
PAD ABD 8X10 STRL (GAUZE/BANDAGES/DRESSINGS) ×4 IMPLANT
PAD ALCOHOL SWAB (MISCELLANEOUS) ×4 IMPLANT
PENCIL BUTTON HOLSTER BLD 10FT (ELECTRODE) ×4 IMPLANT
PIN SAFETY STERILE (MISCELLANEOUS) IMPLANT
SIZER BREAST REUSE 250CC (SIZER) ×1
SIZER BRST REUSE P4.3XHI 250CC (SIZER) ×3 IMPLANT
SIZER GENERIC MENTOR (SIZER) ×4 IMPLANT
SLEEVE SCD COMPRESS KNEE MED (MISCELLANEOUS) ×4 IMPLANT
SPONGE GAUZE 4X4 12PLY STER LF (GAUZE/BANDAGES/DRESSINGS) IMPLANT
SPONGE LAP 18X18 X RAY DECT (DISPOSABLE) ×8 IMPLANT
SUT MNCRL AB 4-0 PS2 18 (SUTURE) ×8 IMPLANT
SUT MON AB 5-0 PS2 18 (SUTURE) ×8 IMPLANT
SUT PDS 3-0 CT2 (SUTURE)
SUT PDS AB 2-0 CT2 27 (SUTURE) IMPLANT
SUT PDS II 3-0 CT2 27 ABS (SUTURE) IMPLANT
SUT VIC AB 3-0 SH 27 (SUTURE) ×4
SUT VIC AB 3-0 SH 27X BRD (SUTURE) ×12 IMPLANT
SUT VICRYL 4-0 PS2 18IN ABS (SUTURE) ×4 IMPLANT
SYR 20CC LL (SYRINGE) IMPLANT
SYR 50ML LL SCALE MARK (SYRINGE) ×4 IMPLANT
SYR BULB IRRIGATION 50ML (SYRINGE) ×4 IMPLANT
SYR CONTROL 10ML LL (SYRINGE) ×4 IMPLANT
SYR TB 1ML LL NO SAFETY (SYRINGE) IMPLANT
SYRINGE TOOMEY DISP (SYRINGE) IMPLANT
TOWEL OR 17X24 6PK STRL BLUE (TOWEL DISPOSABLE) ×8 IMPLANT
TUBE CONNECTING 20X1/4 (TUBING) ×4 IMPLANT
TUBING SET GRADUATE ASPIR 12FT (MISCELLANEOUS) ×4 IMPLANT
UNDERPAD 30X30 INCONTINENT (UNDERPADS AND DIAPERS) ×8 IMPLANT
YANKAUER SUCT BULB TIP NO VENT (SUCTIONS) ×4 IMPLANT

## 2013-06-11 NOTE — H&P (View-Only) (Signed)
  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 female.  HPI The patient is a 43 yrs old female here for follow up following for right breast reconstruction with right latissimus flap and expander placement. She was diagnosed with right breast invasive ductal carcinoma. She underwent a right mastectomy with sentinel lymph node biopsy performed at [facility] by Dr. Rupert Stacks (12/2011). It was ER/PR positive, HER-2/neu negative. The path showed multifocal cancer (5 cm and 0.9 cm) with lymphovascular invasion and associated DCIS, one sentinel node was positive for metastatic disease and went on to have a lymph node dissection but all others were negative. She underwent chemotherapy treatment. Radiation was given by Dr. Thea Silversmith and completed 10/20/2012. She is on tamoxifen 20 mg. She has hypertension, heart murmur, arthritis, chronic back pain, neck pain, and migraines. She is 5 feet 7 inches tall, weighs 158 pounds and wore a 34 A-B bra. She underwent a breast reduction in 2003. She has several incisions on her abdomen from lap surgery. All incisions are clean, dry and intact and without signs of infection. The flap is viable. The right expander has 220 cc of saline. She is pleased with her size.  She would like an implant placed on the left for symmetry.   The following portions of the patient's history were reviewed and updated as appropriate: allergies, current medications, past family history, past medical history, past social history, past  surgical history and problem list.  Review of Systems  All other systems reviewed and are negative.     Objective:    Physical Exam  Constitutional: She appears well-developed and well-nourished.  HENT:   Head: Normocephalic and atraumatic.  Eyes: Conjunctivae and EOM are normal. Pupils are equal, round, and reactive to light.  Pulmonary/Chest: Effort normal.  Musculoskeletal: Normal range of motion.  Neurological: She is alert.  Skin: Skin is warm.  Psychiatric: She has a normal mood and affect. Her behavior is normal. Thought content normal.      Assessment:   1.  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

## 2013-06-11 NOTE — Op Note (Addendum)
Op report Unilateral Breast Exchange   DATE OF OPERATION:  06/11/2013  LOCATION: [facility]  SURGICAL DIVISION: Plastic Surgery  PREOPERATIVE DIAGNOSES:  1. History of right breast cancer.  2. Acquired absence of right breast.   POSTOPERATIVE DIAGNOSES:  1. History of right breast cancer.  2. Acquired absence of right breast.   PROCEDURE:  1. Right exchange of tissue expander for implant.  2. Left breast implant placement for symmetry after reconstruction.  SURGEON: Theodoro Kos, DO  ASSISTANT: Shawn Rayburn, PA  ANESTHESIA:  General.   COMPLICATIONS: None.   IMPLANTS: Left - Mentor Smooth Round High Profile Gel 125cc. Ref #[id number].  Serial Number [id number] Right - Mentor Smooth Round High Profile Gel 250cc. Ref #[id number].  Serial Number [id number]  INDICATIONS FOR PROCEDURE:  The patient, Tammie [name], is a 43 y.o. female born on [birth year], is here for treatment for further treatment after a mastectomy and placement of a tissue expander. She now presents for exchange of her expander for an implant.  She requires capsulotomies to better position the implant.   CONSENT:  Informed consent was obtained directly from the patient. Risks, benefits and alternatives were fully discussed. Specific risks including but not limited to bleeding, infection, hematoma, seroma, scarring, pain, implant infection, implant extrusion, capsular contracture, asymmetry, wound healing problems, and need for further surgery were all discussed. The patient did have an ample opportunity to have her questions answered to her satisfaction.   DESCRIPTION OF PROCEDURE:  The patient was taken to the operating room. SCDs were placed and IV antibiotics were given. The patient's chest was prepped and draped in a sterile fashion. A time out was performed and the implants to be used were identified.  One percent Xylocaine with epinephrine was used to infiltrate the area.   The old  mastectomy scar was opened and superior mastectomy and inferior mastectomy flaps were re-raised over the pectoralis major muscle. The pectoralis was split to expose the tissue expander which was removed. Inspection of the pocket showed a normal healthy capsule.  Circumferential capsulotomies were performed on each breast to allow for breast pocket expansion on either side.  Measurements were made to confirm adequate pocket size for the implant dimensions.  Hemostasis was ensured. Sizers were used on both sides to select the size.  The patient was sat up to check for symmetry.  Gloves were changed. The implant was placed in the pocket and oriented appropriately. The pectoralis major muscle and capsule on the anterior surface was re-closed with a 3-0 Vicryl.  The remaining skin was closed with 4-0 Monocryl deep dermal and 5-0 Monocryl subcuticular stitches. Attention was then turned to the left side.  An inframammary lateral incision was made and the pectoralis was lifted on the deep side to create a pocket.  The sizer was placed and size confirmed.  The implant was placed under the pectoralis muscle.  The deep layers were closed with 3-0 Vicryl followed by 4-0 Monocryl and the skin closed with 5-0 Monocryl.   Dermabond was applied.  Sterile dressings were applied.  A bra and ACE wraps were placed.  There were no complications.  The patient tolerated the procedure well.  The patient was allowed to wake from anesthesia and taken to the recovery room in satisfactory condition.

## 2013-06-11 NOTE — Transfer of Care (Signed)
Immediate Anesthesia Transfer of Care Note  Patient: Tammie [name]  Procedure(s) Performed: Procedure(s): REMOVAL OF RIGHT TISSUE EXPANDER AND PLACEMENT OF IMPLANT (Right) PLACEMENT OF LEFT BREAST IMPLANT FOR SYMETRY(BREAST) (Left)  Patient Location: PACU  Anesthesia Type:General  Level of Consciousness: sedated  Airway & Oxygen Therapy: Patient Spontanous Breathing and Patient connected to face mask oxygen  Post-op Assessment: Report given to PACU RN and Post -op Vital signs reviewed and stable  Post vital signs: Reviewed and stable  Complications: No apparent anesthesia complications

## 2013-06-11 NOTE — Interval H&P Note (Signed)
History and Physical Interval Note:  06/11/2013 7:34 AM  Tammie [name]  has presented today for surgery, with the diagnosis of PERSONAL HISTORY OF BREAST CANCER  The various methods of treatment have been discussed with the patient and family. After consideration of risks, benefits and other options for treatment, the patient has consented to  Procedure(s): REMOVAL OF RIGHT TISSUE EXPANDER AND PLACEMENT OF IMPLANT (Right) PLACEMENT OF LEFT BREAST IMPLANT FOR SYMETRY(BREAST) (Left) POSSIBLE LIPOSUCTION WITH LIPOFILLING (Bilateral) as a surgical intervention .  The patient's history has been reviewed, patient examined, no change in status, stable for surgery.  I have reviewed the patient's chart and labs.  Questions were answered to the patient's satisfaction.     SANGER,CLAIRE

## 2013-06-11 NOTE — Brief Op Note (Addendum)
06/11/2013  11:56 AM  PATIENT:  Tammie [name]  43 y.o. female  PRE-OPERATIVE DIAGNOSIS:  PERSONAL HISTORY OF BREAST CANCER  POST-OPERATIVE DIAGNOSIS:  Acquired absence of right breast and left breast asymmetry.  PROCEDURE:  Procedure(s): REMOVAL OF RIGHT TISSUE EXPANDER AND PLACEMENT OF IMPLANT (Right) PLACEMENT OF LEFT BREAST IMPLANT FOR SYMETRY(BREAST) (Left) POSSIBLE LIPOSUCTION WITH LIPOFILLING (Bilateral)  SURGEON:  Surgeon(s) and Role:    * Claire Sanger, DO - Primary  PHYSICIAN ASSISTANT: Shawn Rayburn, PA  ASSISTANTS: none   ANESTHESIA:   general  EBL:     BLOOD ADMINISTERED:none  DRAINS: none   LOCAL MEDICATIONS USED:  LIDOCAINE   SPECIMEN:  Source of Specimen:  right mastectomy scar  DISPOSITION OF SPECIMEN:  PATHOLOGY  COUNTS:  YES  TOURNIQUET:  * No tourniquets in log *  DICTATION: .Dragon Dictation  PLAN OF CARE: Discharge to home after PACU  PATIENT DISPOSITION:  PACU - hemodynamically stable.   Delay start of Pharmacological VTE agent (>24hrs) due to surgical blood loss or risk of bleeding: no

## 2013-06-11 NOTE — Discharge Instructions (Signed)
May shower starting Saturday. Continue breast binder.   Post Anesthesia Home Care Instructions  Activity: Get plenty of rest for the remainder of the day. A responsible adult should stay with you for 24 hours following the procedure.  For the next 24 hours, DO NOT: -Drive a car -Paediatric nurse -Drink alcoholic beverages -Take any medication unless instructed by your physician -Make any legal decisions or sign important papers.  Meals: Start with liquid foods such as gelatin or soup. Progress to regular foods as tolerated. Avoid greasy, spicy, heavy foods. If nausea and/or vomiting occur, drink only clear liquids until the nausea and/or vomiting subsides. Call your physician if vomiting continues.  Special Instructions/Symptoms: Your throat may feel dry or sore from the anesthesia or the breathing tube placed in your throat during surgery. If this causes discomfort, gargle with warm salt water. The discomfort should disappear within 24 hours.

## 2013-06-11 NOTE — Anesthesia Preprocedure Evaluation (Signed)
Anesthesia Evaluation  Patient identified by MRN, date of birth, ID band Patient awake    Reviewed: Allergy & Precautions, H&P , NPO status , Patient's Chart, lab work & pertinent test results  Airway Mallampati: I TM Distance: >3 FB Neck ROM: Full    Dental  (+) Teeth Intact, Dental Advisory Given   Pulmonary  breath sounds clear to auscultation        Cardiovascular hypertension, Pt. on medications Rhythm:Regular Rate:Normal     Neuro/Psych    GI/Hepatic GERD-  Medicated and Controlled,  Endo/Other    Renal/GU      Musculoskeletal   Abdominal   Peds  Hematology   Anesthesia Other Findings   Reproductive/Obstetrics                           Anesthesia Physical Anesthesia Plan  ASA: II  Anesthesia Plan: General   Post-op Pain Management:    Induction: Intravenous  Airway Management Planned: Oral ETT  Additional Equipment:   Intra-op Plan:   Post-operative Plan: Extubation in OR  Informed Consent: I have reviewed the patients History and Physical, chart, labs and discussed the procedure including the risks, benefits and alternatives for the proposed anesthesia with the patient or authorized representative who has indicated his/her understanding and acceptance.   Dental advisory given  Plan Discussed with: CRNA, Anesthesiologist and Surgeon  Anesthesia Plan Comments:         Anesthesia Quick Evaluation

## 2013-06-11 NOTE — Anesthesia Procedure Notes (Signed)
Procedure Name: Intubation Date/Time: 06/11/2013 12:05 PM Performed by: Melynda Ripple D Pre-anesthesia Checklist: Patient identified, Emergency Drugs available, Suction available and Patient being monitored Patient Re-evaluated:Patient Re-evaluated prior to inductionOxygen Delivery Method: Circle System Utilized Preoxygenation: Pre-oxygenation with 100% oxygen Intubation Type: IV induction Ventilation: Mask ventilation without difficulty Laryngoscope Size: Mac and 3 Grade View: Grade I Tube type: Oral Number of attempts: 1 Airway Equipment and Method: stylet and oral airway Placement Confirmation: ETT inserted through vocal cords under direct vision,  positive ETCO2 and breath sounds checked- equal and bilateral Secured at: 23 cm Tube secured with: Tape Dental Injury: Teeth and Oropharynx as per pre-operative assessment

## 2013-06-11 NOTE — Anesthesia Postprocedure Evaluation (Signed)
  Anesthesia Post-op Note  Patient: Tammie [name]  Procedure(s) Performed: Procedure(s): REMOVAL OF RIGHT TISSUE EXPANDER AND PLACEMENT OF IMPLANT (Right) PLACEMENT OF LEFT BREAST IMPLANT FOR SYMETRY(BREAST) (Left)  Patient Location: PACU  Anesthesia Type:General  Level of Consciousness: awake, alert  and oriented  Airway and Oxygen Therapy: Patient Spontanous Breathing  Post-op Pain: mild  Post-op Assessment: Post-op Vital signs reviewed  Post-op Vital Signs: Reviewed  Complications: No apparent anesthesia complications

## 2013-06-12 ENCOUNTER — Encounter (HOSPITAL_BASED_OUTPATIENT_CLINIC_OR_DEPARTMENT_OTHER): Payer: Self-pay | Admitting: Plastic Surgery

## 2013-06-12 LAB — POCT HEMOGLOBIN-HEMACUE: Hemoglobin: 11.2 g/dL — ABNORMAL LOW (ref 12.0–15.0)

## 2013-06-15 ENCOUNTER — Ambulatory Visit (HOSPITAL_BASED_OUTPATIENT_CLINIC_OR_DEPARTMENT_OTHER): Payer: PRIVATE HEALTH INSURANCE | Admitting: Oncology

## 2013-06-15 ENCOUNTER — Other Ambulatory Visit (HOSPITAL_BASED_OUTPATIENT_CLINIC_OR_DEPARTMENT_OTHER): Payer: PRIVATE HEALTH INSURANCE

## 2013-06-15 ENCOUNTER — Ambulatory Visit (HOSPITAL_BASED_OUTPATIENT_CLINIC_OR_DEPARTMENT_OTHER): Payer: No Typology Code available for payment source

## 2013-06-15 ENCOUNTER — Encounter: Payer: Self-pay | Admitting: Oncology

## 2013-06-15 VITALS — BP 103/57 | HR 80 | Temp 98.0°F | Resp 20 | Ht 67.5 in | Wt 159.5 lb

## 2013-06-15 DIAGNOSIS — E559 Vitamin D deficiency, unspecified: Secondary | ICD-10-CM

## 2013-06-15 DIAGNOSIS — C50519 Malignant neoplasm of lower-outer quadrant of unspecified female breast: Secondary | ICD-10-CM

## 2013-06-15 DIAGNOSIS — C50511 Malignant neoplasm of lower-outer quadrant of right female breast: Secondary | ICD-10-CM

## 2013-06-15 LAB — CBC WITH DIFFERENTIAL/PLATELET
BASO%: 0.3 % (ref 0.0–2.0)
Basophils Absolute: 0 10*3/uL (ref 0.0–0.1)
EOS%: 0.9 % (ref 0.0–7.0)
Eosinophils Absolute: 0 10*3/uL (ref 0.0–0.5)
HEMATOCRIT: 36.2 % (ref 34.8–46.6)
HGB: 11.2 g/dL — ABNORMAL LOW (ref 11.6–15.9)
LYMPH%: 37.5 % (ref 14.0–49.7)
MCH: 23.6 pg — AB (ref 25.1–34.0)
MCHC: 30.9 g/dL — ABNORMAL LOW (ref 31.5–36.0)
MCV: 76.2 fL — ABNORMAL LOW (ref 79.5–101.0)
MONO#: 0.2 10*3/uL (ref 0.1–0.9)
MONO%: 6.4 % (ref 0.0–14.0)
NEUT#: 1.8 10*3/uL (ref 1.5–6.5)
NEUT%: 54.9 % (ref 38.4–76.8)
PLATELETS: 234 10*3/uL (ref 145–400)
RBC: 4.75 10*6/uL (ref 3.70–5.45)
RDW: 14.6 % — ABNORMAL HIGH (ref 11.2–14.5)
WBC: 3.3 10*3/uL — ABNORMAL LOW (ref 3.9–10.3)
lymph#: 1.2 10*3/uL (ref 0.9–3.3)

## 2013-06-15 LAB — COMPREHENSIVE METABOLIC PANEL (CC13)
ALK PHOS: 122 U/L (ref 40–150)
ALT: 48 U/L (ref 0–55)
AST: 50 U/L — ABNORMAL HIGH (ref 5–34)
Albumin: 3.5 g/dL (ref 3.5–5.0)
Anion Gap: 8 mEq/L (ref 3–11)
BILIRUBIN TOTAL: 0.25 mg/dL (ref 0.20–1.20)
BUN: 7.1 mg/dL (ref 7.0–26.0)
CO2: 26 mEq/L (ref 22–29)
CREATININE: 0.7 mg/dL (ref 0.6–1.1)
Calcium: 9.2 mg/dL (ref 8.4–10.4)
Chloride: 108 mEq/L (ref 98–109)
Glucose: 71 mg/dl (ref 70–140)
Potassium: 3.4 mEq/L — ABNORMAL LOW (ref 3.5–5.1)
SODIUM: 142 meq/L (ref 136–145)
Total Protein: 7.1 g/dL (ref 6.4–8.3)

## 2013-06-15 NOTE — Patient Instructions (Signed)
Exemestane tablets What is this medicine? EXEMESTANE (ex e MES tane) blocks the production of the hormone estrogen. Some types of breast cancer depend on estrogen to grow, and this medicine can stop tumor growth by blocking estrogen production. This medicine is for the treatment of breast cancer in postmenopausal women only. This medicine may be used for other purposes; ask your health care provider or pharmacist if you have questions. COMMON BRAND NAME(S): Aromasin What should I tell my health care provider before I take this medicine? They need to know if you have any of these conditions: -an unusual or allergic reaction to exemestane, other medicines, foods, dyes, or preservatives -pregnant or trying to get pregnant -breast-feeding How should I use this medicine? Take this medicine by mouth with a glass of water. Follow the directions on the prescription label. Take your doses at regular intervals after a meal. Do not take your medicine more often than directed. Do not stop taking except on the advice of your doctor or health care professional. Contact your pediatrician regarding the use of this medicine in children. Special care may be needed. Overdosage: If you think you have taken too much of this medicine contact a poison control center or emergency room at once. NOTE: This medicine is only for you. Do not share this medicine with others. What if I miss a dose? If you miss a dose, take the next dose as usual. Do not try to make up the missed dose. Do not take double or extra doses. What may interact with this medicine? Do not take this medicine with any of the following medications: -female hormones, like estrogens and birth control pills This medicine may also interact with the following medications: -androstenedione -phenytoin -rifabutin, rifampin, or rifapentine -St. John's Wort This list may not describe all possible interactions. Give your health care provider a list of all the  medicines, herbs, non-prescription drugs, or dietary supplements you use. Also tell them if you smoke, drink alcohol, or use illegal drugs. Some items may interact with your medicine. What should I watch for while using this medicine? Visit your doctor or health care professional for regular checks on your progress. If you experience hot flashes or sweating while taking this medicine, avoid alcohol, smoking and drinks with caffeine. This may help to decrease these side effects. What side effects may I notice from receiving this medicine? Side effects that you should report to your doctor or health care professional as soon as possible: -any new or unusual symptoms -changes in vision -fever -leg or arm swelling -pain in bones, joints, or muscles -pain in hips, back, ribs, arms, shoulders, or legs Side effects that usually do not require medical attention (report to your doctor or health care professional if they continue or are bothersome): -difficulty sleeping -headache -hot flashes -sweating -unusually weak or tired This list may not describe all possible side effects. Call your doctor for medical advice about side effects. You may report side effects to FDA at 1-800-FDA-1088. Where should I keep my medicine? Keep out of the reach of children. Store at room temperature between 15 and 30 degrees C (59 and 86 degrees F). Throw away any unused medicine after the expiration date. NOTE: This sheet is a summary. It may not cover all possible information. If you have questions about this medicine, talk to your doctor, pharmacist, or health care provider.  2014, Elsevier/Gold Standard. (2007-08-19 11:48:29)  Goserelin injection What is this medicine? GOSERELIN (GOE se rel in) is similar to  a hormone found in the body. It lowers the amount of sex hormones that the body makes. Men will have lower testosterone levels and women will have lower estrogen levels while taking this medicine. In men, this  medicine is used to treat prostate cancer; the injection is either given once per month or once every 12 weeks. A once per month injection (only) is used to treat women with endometriosis, dysfunctional uterine bleeding, or advanced breast cancer. This medicine may be used for other purposes; ask your health care provider or pharmacist if you have questions. COMMON BRAND NAME(S): Zoladex What should I tell my health care provider before I take this medicine? They need to know if you have any of these conditions (some only apply to women): -diabetes -heart disease or previous heart attack -high blood pressure -high cholesterol -kidney disease -osteoporosis or low bone density -problems passing urine -spinal cord injury -stroke -tobacco smoker -an unusual or allergic reaction to goserelin, hormone therapy, other medicines, foods, dyes, or preservatives -pregnant or trying to get pregnant -breast-feeding How should I use this medicine? This medicine is for injection under the skin. It is given by a health care professional in a hospital or clinic setting. Men receive this injection once every 4 weeks or once every 12 weeks. Women will only receive the once every 4 weeks injection. Talk to your pediatrician regarding the use of this medicine in children. Special care may be needed. Overdosage: If you think you have taken too much of this medicine contact a poison control center or emergency room at once. NOTE: This medicine is only for you. Do not share this medicine with others. What if I miss a dose? It is important not to miss your dose. Call your doctor or health care professional if you are unable to keep an appointment. What may interact with this medicine? -female hormones like estrogen -herbal or dietary supplements like black cohosh, chasteberry, or DHEA -female hormones like testosterone -prasterone This list may not describe all possible interactions. Give your health care provider  a list of all the medicines, herbs, non-prescription drugs, or dietary supplements you use. Also tell them if you smoke, drink alcohol, or use illegal drugs. Some items may interact with your medicine. What should I watch for while using this medicine? Visit your doctor or health care professional for regular checks on your progress. Your symptoms may appear to get worse during the first weeks of this therapy. Tell your doctor or healthcare professional if your symptoms do not start to get better or if they get worse after this time. Your bones may get weaker if you take this medicine for a long time. If you smoke or frequently drink alcohol you may increase your risk of bone loss. A family history of osteoporosis, chronic use of drugs for seizures (convulsions), or corticosteroids can also increase your risk of bone loss. Talk to your doctor about how to keep your bones strong. This medicine should stop regular monthly menstration in women. Tell your doctor if you continue to [facility]. Women should not become pregnant while taking this medicine or for 12 weeks after stopping this medicine. Women should inform their doctor if they wish to become pregnant or think they might be pregnant. There is a potential for serious side effects to an unborn child. Talk to your health care professional or pharmacist for more information. Do not breast-feed an infant while taking this medicine. Men should inform their doctors if they wish to father a  child. This medicine may lower sperm counts. Talk to your health care professional or pharmacist for more information. What side effects may I notice from receiving this medicine? Side effects that you should report to your doctor or health care professional as soon as possible: -allergic reactions like skin rash, itching or hives, swelling of the face, lips, or tongue -bone pain -breathing problems -changes in vision -chest pain -feeling faint or lightheaded,  falls -fever, chills -pain, swelling, warmth in the leg -pain, tingling, numbness in the hands or feet -swelling of the ankles, feet, hands -trouble passing urine or change in the amount of urine -unusually high or low blood pressure -unusually weak or tired Side effects that usually do not require medical attention (report to your doctor or health care professional if they continue or are bothersome): -change in sex drive or performance -changes in breast size in both males and females -changes in emotions or moods -headache -hot flashes -irritation at site where injected -loss of appetite -skin problems like acne, dry skin -vaginal dryness This list may not describe all possible side effects. Call your doctor for medical advice about side effects. You may report side effects to FDA at 1-800-FDA-1088. Where should I keep my medicine? This drug is given in a hospital or clinic and will not be stored at home. NOTE: This sheet is a summary. It may not cover all possible information. If you have questions about this medicine, talk to your doctor, pharmacist, or health care provider.  2014, Elsevier/Gold Standard. (2008-08-31 13:28:29)
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
## 2013-06-16 ENCOUNTER — Telehealth: Payer: Self-pay | Admitting: *Deleted

## 2013-06-16 LAB — VITAMIN D 25 HYDROXY (VIT D DEFICIENCY, FRACTURES): Vit D, 25-Hydroxy: 10 ng/mL — ABNORMAL LOW (ref 30–89)

## 2013-06-16 NOTE — Telephone Encounter (Signed)
Message copied by Amelia Jo I on Tue Jun 16, 2013 11:40 AM ------      Message from: Deatra Robinson      Created: Tue Jun 16, 2013  8:50 AM       Take 50,000 units weekly of Vitamin D2            #30/3 ------

## 2013-06-16 NOTE — Telephone Encounter (Signed)
As noted below by Dr. Humphrey Rolls, I left a voice mail message with patient that Vitamin D level was low. To start taking Vitamin D2  50,000 units/ weekly. If she had any questions, please call [phone].

## 2013-06-17 ENCOUNTER — Telehealth: Payer: Self-pay | Admitting: Oncology

## 2013-06-17 NOTE — Telephone Encounter (Signed)
lmonvm for pt re monthly inj's and next f/u. also left info for pt re bone density @ [facility] 4/1. message left w/each appt d/t and pt to get new schedule when she comes in on 2/20.

## 2013-06-18 LAB — ESTRADIOL, ULTRA SENS: Estradiol, Ultra Sensitive: 231 pg/mL

## 2013-06-19 ENCOUNTER — Telehealth: Payer: Self-pay | Admitting: Family

## 2013-06-19 ENCOUNTER — Ambulatory Visit (HOSPITAL_BASED_OUTPATIENT_CLINIC_OR_DEPARTMENT_OTHER): Payer: No Typology Code available for payment source

## 2013-06-19 VITALS — BP 147/83 | HR 69 | Temp 98.2°F

## 2013-06-19 DIAGNOSIS — C50519 Malignant neoplasm of lower-outer quadrant of unspecified female breast: Secondary | ICD-10-CM

## 2013-06-19 DIAGNOSIS — Z5111 Encounter for antineoplastic chemotherapy: Secondary | ICD-10-CM

## 2013-06-19 MED ORDER — LEVOCETIRIZINE DIHYDROCHLORIDE 5 MG PO TABS: 5.0000 mg | ORAL_TABLET | Freq: Every evening | ORAL | Status: DC

## 2013-06-19 MED ORDER — GOSERELIN ACETATE 3.6 MG ~~LOC~~ IMPL
3.6000 mg | DRUG_IMPLANT | Freq: Once | SUBCUTANEOUS | Status: AC
Start: 2013-06-19 — End: 2013-06-19
  Administered 2013-06-19: 3.6 mg via SUBCUTANEOUS
  Filled 2013-06-19: qty 3.6

## 2013-06-19 NOTE — Telephone Encounter (Signed)
Patient is requesting a 90 day supply of levocetirizine

## 2013-06-19 NOTE — Patient Instructions (Signed)
Goserelin injection What is this medicine? GOSERELIN (GOE se rel in) is similar to a hormone found in the body. It lowers the amount of sex hormones that the body makes. Men will have lower testosterone levels and women will have lower estrogen levels while taking this medicine. In men, this medicine is used to treat prostate cancer; the injection is either given once per month or once every 12 weeks. A once per month injection (only) is used to treat women with endometriosis, dysfunctional uterine bleeding, or advanced breast cancer. This medicine may be used for other purposes; ask your health care provider or pharmacist if you have questions. COMMON BRAND NAME(S): Zoladex What should I tell my health care provider before I take this medicine? They need to know if you have any of these conditions (some only apply to women): -diabetes -heart disease or previous heart attack -high blood pressure -high cholesterol -kidney disease -osteoporosis or low bone density -problems passing urine -spinal cord injury -stroke -tobacco smoker -an unusual or allergic reaction to goserelin, hormone therapy, other medicines, foods, dyes, or preservatives -pregnant or trying to get pregnant -breast-feeding How should I use this medicine? This medicine is for injection under the skin. It is given by a health care professional in a hospital or clinic setting. Men receive this injection once every 4 weeks or once every 12 weeks. Women will only receive the once every 4 weeks injection. Talk to your pediatrician regarding the use of this medicine in children. Special care may be needed. Overdosage: If you think you have taken too much of this medicine contact a poison control center or emergency room at once. NOTE: This medicine is only for you. Do not share this medicine with others. What if I miss a dose? It is important not to miss your dose. Call your doctor or health care professional if you are unable to  keep an appointment. What may interact with this medicine? -female hormones like estrogen -herbal or dietary supplements like black cohosh, chasteberry, or DHEA -female hormones like testosterone -prasterone This list may not describe all possible interactions. Give your health care provider a list of all the medicines, herbs, non-prescription drugs, or dietary supplements you use. Also tell them if you smoke, drink alcohol, or use illegal drugs. Some items may interact with your medicine. What should I watch for while using this medicine? Visit your doctor or health care professional for regular checks on your progress. Your symptoms may appear to get worse during the first weeks of this therapy. Tell your doctor or healthcare professional if your symptoms do not start to get better or if they get worse after this time. Your bones may get weaker if you take this medicine for a long time. If you smoke or frequently drink alcohol you may increase your risk of bone loss. A family history of osteoporosis, chronic use of drugs for seizures (convulsions), or corticosteroids can also increase your risk of bone loss. Talk to your doctor about how to keep your bones strong. This medicine should stop regular monthly menstration in women. Tell your doctor if you continue to menstrate. Women should not become pregnant while taking this medicine or for 12 weeks after stopping this medicine. Women should inform their doctor if they wish to become pregnant or think they might be pregnant. There is a potential for serious side effects to an unborn child. Talk to your health care professional or pharmacist for more information. Do not breast-feed an infant while taking   this medicine. Men should inform their doctors if they wish to father a child. This medicine may lower sperm counts. Talk to your health care professional or pharmacist for more information. What side effects may I notice from receiving this  medicine? Side effects that you should report to your doctor or health care professional as soon as possible: -allergic reactions like skin rash, itching or hives, swelling of the face, lips, or tongue -bone pain -breathing problems -changes in vision -chest pain -feeling faint or lightheaded, falls -fever, chills -pain, swelling, warmth in the leg -pain, tingling, numbness in the hands or feet -swelling of the ankles, feet, hands -trouble passing urine or change in the amount of urine -unusually high or low blood pressure -unusually weak or tired Side effects that usually do not require medical attention (report to your doctor or health care professional if they continue or are bothersome): -change in sex drive or performance -changes in breast size in both males and females -changes in emotions or moods -headache -hot flashes -irritation at site where injected -loss of appetite -skin problems like acne, dry skin -vaginal dryness This list may not describe all possible side effects. Call your doctor for medical advice about side effects. You may report side effects to FDA at 1-800-FDA-1088. Where should I keep my medicine? This drug is given in a hospital or clinic and will not be stored at home. NOTE: This sheet is a summary. It may not cover all possible information. If you have questions about this medicine, talk to your doctor, pharmacist, or health care provider.  2014, Elsevier/Gold Standard. (2008-08-31 13:28:29)  

## 2013-06-19 NOTE — Telephone Encounter (Signed)
Refill sent.

## 2013-07-03 ENCOUNTER — Telehealth: Payer: Self-pay | Admitting: *Deleted

## 2013-07-03 NOTE — Telephone Encounter (Signed)
Received letter and form from [employer] ([place]) requesting completion of disability form. Form forwarded to Provider.

## 2013-07-10 ENCOUNTER — Telehealth: Payer: Self-pay

## 2013-07-10 ENCOUNTER — Telehealth: Payer: Self-pay | Admitting: Family

## 2013-07-10 ENCOUNTER — Telehealth: Payer: Self-pay | Admitting: *Deleted

## 2013-07-10 ENCOUNTER — Other Ambulatory Visit: Payer: Self-pay

## 2013-07-10 MED ORDER — LEVOCETIRIZINE DIHYDROCHLORIDE 5 MG PO TABS: 5.0000 mg | ORAL_TABLET | Freq: Every evening | ORAL | Status: DC

## 2013-07-10 MED ORDER — MONTELUKAST SODIUM 10 MG PO TABS: 10.0000 mg | ORAL_TABLET | Freq: Every day | ORAL | Status: DC

## 2013-07-10 NOTE — Telephone Encounter (Signed)
KK - ok'd compazine 10 mg PO, q6 prn, 30 tabs, no refills.  Let pt know scrip would be avail for her to pick and we would let her daughter know.  Pt voiced understanding.  Roz verified [facility]  [facility].

## 2013-07-10 NOTE — Telephone Encounter (Signed)
Opened in error

## 2013-07-10 NOTE — Telephone Encounter (Signed)
Received fax from [place] requesting refills on singulair and levocetirizine.  Verified with pt that she would like refills to [facility]. Refills sent.

## 2013-07-10 NOTE — Telephone Encounter (Signed)
Pt has been unable to find Vit D2 locally.  Her mail order pharmacy can provide to her - they are following through with triage.  Pt would like scrips for new meds sent to mail order company also.  Per KK's note, pt needs bone density and estradiol checked.  Estradiol is resulted.  It does not appear that bone density has been done.  Advised patient that approval would have to be obtained prior to sending scrips to pharmacy.  Routed to [place] for further instruction.

## 2013-07-13 ENCOUNTER — Other Ambulatory Visit: Payer: Self-pay

## 2013-07-13 MED ORDER — EXEMESTANE 25 MG PO TABS: 25.0000 mg | ORAL_TABLET | Freq: Every day | ORAL | Status: DC

## 2013-07-13 NOTE — Telephone Encounter (Signed)
LMOVM - Order has been placed for aromasin with mail order pharmacy.

## 2013-07-14 ENCOUNTER — Telehealth: Payer: Self-pay | Admitting: *Deleted

## 2013-07-14 NOTE — Telephone Encounter (Signed)
Received message from Tammie [name] at [facility] that their preferred alternative for levocetirizine is Allegra.  Left message for pt to return my call to ask what medications she has already tried.

## 2013-07-15 ENCOUNTER — Other Ambulatory Visit: Payer: Self-pay | Admitting: *Deleted

## 2013-07-15 DIAGNOSIS — C50519 Malignant neoplasm of lower-outer quadrant of unspecified female breast: Secondary | ICD-10-CM

## 2013-07-15 MED ORDER — FEXOFENADINE HCL 180 MG PO TABS: 180.0000 mg | ORAL_TABLET | Freq: Every day | ORAL | Status: DC

## 2013-07-15 MED ORDER — GABAPENTIN 300 MG PO CAPS
ORAL_CAPSULE | ORAL | Status: AC
Start: 2013-07-15 — End: ?

## 2013-07-15 MED ORDER — GABAPENTIN 300 MG PO CAPS: ORAL_CAPSULE | ORAL | Status: DC

## 2013-07-15 NOTE — Telephone Encounter (Signed)
Per verbal from Provider, need to know diagnosis for disability form. Pt states she would like it to be for her breast cancer and depression. Pt has restarted Celexa x 2 weeks and feels some improvement of symptoms. Does note difficulty staying asleep. Scheduled pt for f/u on 07/17/13 at 7am to discuss.

## 2013-07-15 NOTE — Telephone Encounter (Signed)
Spoke with pt. She prefers rx for [employer]. Rx sent to [facility].

## 2013-07-15 NOTE — Telephone Encounter (Signed)
Pt left message returning my call. Attempted to reach pt and left message for pt to return my call and let us know if we can leave detailed message on her voicemail.

## 2013-07-15 NOTE — Telephone Encounter (Signed)
Also received additional info from [facility] that they also prefer: claritin and zyrtec.  Ref # [id number].

## 2013-07-15 NOTE — Telephone Encounter (Signed)
Spoke with pt, she states she took Human resources officer D in the past and it elevated her BP. States that the antihistamines with decongestant controls her symptoms the best but she is unable to tolerate them due to affect on BP.  Please advise if one of the preferred alternatives to Xyzal would be appropriate?

## 2013-07-15 NOTE — Telephone Encounter (Signed)
She can try any of the otc antihistamines without decongestant:  Zytec, claritin or allegra. These are available OTC.   I have pended rx for allegra if she prefers that [facility].

## 2013-07-17 ENCOUNTER — Ambulatory Visit (INDEPENDENT_AMBULATORY_CARE_PROVIDER_SITE_OTHER): Payer: 59 | Admitting: Family

## 2013-07-17 ENCOUNTER — Encounter: Payer: Self-pay | Admitting: Family

## 2013-07-17 ENCOUNTER — Ambulatory Visit (HOSPITAL_BASED_OUTPATIENT_CLINIC_OR_DEPARTMENT_OTHER): Payer: No Typology Code available for payment source

## 2013-07-17 VITALS — BP 130/86 | HR 86 | Temp 97.5°F | Resp 16 | Ht 67.5 in | Wt 162.1 lb

## 2013-07-17 VITALS — BP 145/98 | HR 78 | Temp 97.4°F

## 2013-07-17 DIAGNOSIS — F329 Major depressive disorder, single episode, unspecified: Secondary | ICD-10-CM

## 2013-07-17 DIAGNOSIS — C773 Secondary and unspecified malignant neoplasm of axilla and upper limb lymph nodes: Secondary | ICD-10-CM

## 2013-07-17 DIAGNOSIS — F32A Depression, unspecified: Secondary | ICD-10-CM

## 2013-07-17 DIAGNOSIS — C50519 Malignant neoplasm of lower-outer quadrant of unspecified female breast: Secondary | ICD-10-CM

## 2013-07-17 DIAGNOSIS — Z5111 Encounter for antineoplastic chemotherapy: Secondary | ICD-10-CM

## 2013-07-17 DIAGNOSIS — R7309 Other abnormal glucose: Secondary | ICD-10-CM

## 2013-07-17 DIAGNOSIS — F3289 Other specified depressive episodes: Secondary | ICD-10-CM

## 2013-07-17 DIAGNOSIS — R739 Hyperglycemia, unspecified: Secondary | ICD-10-CM

## 2013-07-17 MED ORDER — CITALOPRAM HYDROBROMIDE 20 MG PO TABS: 20.0000 mg | ORAL_TABLET | Freq: Every day | ORAL | Status: DC

## 2013-07-17 MED ORDER — GOSERELIN ACETATE 3.6 MG ~~LOC~~ IMPL
3.6000 mg | DRUG_IMPLANT | Freq: Once | SUBCUTANEOUS | Status: AC
  Administered 2013-07-17: 3.6 mg via SUBCUTANEOUS
  Filled 2013-07-17: qty 3.6

## 2013-07-17 NOTE — Assessment & Plan Note (Addendum)
Improving, but still not optimally controlled.  I advised the patient to give it some more time on current dose of citalopram.  Will have her follow up in 2 months, continue to work with therapist.

## 2013-07-17 NOTE — Patient Instructions (Signed)
Goserelin injection What is this medicine? GOSERELIN (GOE se rel in) is similar to a hormone found in the body. It lowers the amount of sex hormones that the body makes. Men will have lower testosterone levels and women will have lower estrogen levels while taking this medicine. In men, this medicine is used to treat prostate cancer; the injection is either given once per month or once every 12 weeks. A once per month injection (only) is used to treat women with endometriosis, dysfunctional uterine bleeding, or advanced breast cancer. This medicine may be used for other purposes; ask your health care provider or pharmacist if you have questions. COMMON BRAND NAME(S): Zoladex What should I tell my health care provider before I take this medicine? They need to know if you have any of these conditions (some only apply to women): -diabetes -heart disease or previous heart attack -high blood pressure -high cholesterol -kidney disease -osteoporosis or low bone density -problems passing urine -spinal cord injury -stroke -tobacco smoker -an unusual or allergic reaction to goserelin, hormone therapy, other medicines, foods, dyes, or preservatives -pregnant or trying to get pregnant -breast-feeding How should I use this medicine? This medicine is for injection under the skin. It is given by a health care professional in a hospital or clinic setting. Men receive this injection once every 4 weeks or once every 12 weeks. Women will only receive the once every 4 weeks injection. Talk to your pediatrician regarding the use of this medicine in children. Special care may be needed. Overdosage: If you think you have taken too much of this medicine contact a poison control center or emergency room at once. NOTE: This medicine is only for you. Do not share this medicine with others. What if I miss a dose? It is important not to miss your dose. Call your doctor or health care professional if you are unable to  keep an appointment. What may interact with this medicine? -female hormones like estrogen -herbal or dietary supplements like black cohosh, chasteberry, or DHEA -female hormones like testosterone -prasterone This list may not describe all possible interactions. Give your health care provider a list of all the medicines, herbs, non-prescription drugs, or dietary supplements you use. Also tell them if you smoke, drink alcohol, or use illegal drugs. Some items may interact with your medicine. What should I watch for while using this medicine? Visit your doctor or health care professional for regular checks on your progress. Your symptoms may appear to get worse during the first weeks of this therapy. Tell your doctor or healthcare professional if your symptoms do not start to get better or if they get worse after this time. Your bones may get weaker if you take this medicine for a long time. If you smoke or frequently drink alcohol you may increase your risk of bone loss. A family history of osteoporosis, chronic use of drugs for seizures (convulsions), or corticosteroids can also increase your risk of bone loss. Talk to your doctor about how to keep your bones strong. This medicine should stop regular monthly menstration in women. Tell your doctor if you continue to menstrate. Women should not become pregnant while taking this medicine or for 12 weeks after stopping this medicine. Women should inform their doctor if they wish to become pregnant or think they might be pregnant. There is a potential for serious side effects to an unborn child. Talk to your health care professional or pharmacist for more information. Do not breast-feed an infant while taking   this medicine. Men should inform their doctors if they wish to father a child. This medicine may lower sperm counts. Talk to your health care professional or pharmacist for more information. What side effects may I notice from receiving this  medicine? Side effects that you should report to your doctor or health care professional as soon as possible: -allergic reactions like skin rash, itching or hives, swelling of the face, lips, or tongue -bone pain -breathing problems -changes in vision -chest pain -feeling faint or lightheaded, falls -fever, chills -pain, swelling, warmth in the leg -pain, tingling, numbness in the hands or feet -swelling of the ankles, feet, hands -trouble passing urine or change in the amount of urine -unusually high or low blood pressure -unusually weak or tired Side effects that usually do not require medical attention (report to your doctor or health care professional if they continue or are bothersome): -change in sex drive or performance -changes in breast size in both males and females -changes in emotions or moods -headache -hot flashes -irritation at site where injected -loss of appetite -skin problems like acne, dry skin -vaginal dryness This list may not describe all possible side effects. Call your doctor for medical advice about side effects. You may report side effects to FDA at 1-800-FDA-1088. Where should I keep my medicine? This drug is given in a hospital or clinic and will not be stored at home. NOTE: This sheet is a summary. It may not cover all possible information. If you have questions about this medicine, talk to your doctor, pharmacist, or health care provider.  2014, Elsevier/Gold Standard. (2008-08-31 13:28:29)  
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
## 2013-07-17 NOTE — Assessment & Plan Note (Signed)
Follow up sugar was normal.  Monitor.

## 2013-07-17 NOTE — Patient Instructions (Signed)
Continue citalopram. Follow up in 2 months.

## 2013-07-24 ENCOUNTER — Telehealth: Payer: Self-pay | Admitting: *Deleted

## 2013-07-24 DIAGNOSIS — Z0279 Encounter for issue of other medical certificate: Secondary | ICD-10-CM

## 2013-07-24 NOTE — Telephone Encounter (Signed)
Received forms from [place] to be completed for pt's application for disability. Also requests records from 2013 to Present.  Forms / records faxed to [phone].

## 2013-07-29 ENCOUNTER — Ambulatory Visit
Admission: RE | Admit: 2013-07-29 | Discharge: 2013-07-29 | Disposition: A | Discharge status: Home / Self Care | Payer: 59 | Source: Ambulatory Visit | Attending: Oncology | Admitting: Oncology

## 2013-07-29 DIAGNOSIS — C50519 Malignant neoplasm of lower-outer quadrant of unspecified female breast: Secondary | ICD-10-CM

## 2013-07-29 DIAGNOSIS — E559 Vitamin D deficiency, unspecified: Secondary | ICD-10-CM

## 2013-07-30 ENCOUNTER — Telehealth: Payer: Self-pay

## 2013-07-30 NOTE — Telephone Encounter (Signed)
Message copied by Prentiss Bells on Thu Jul 30, 2013 11:55 AM ------      Message from: Tammie [name]      Created: Wed Jul 29, 2013  4:36 PM       Bone density looks good,             Take vitamin D and calcium ------

## 2013-07-30 NOTE — Telephone Encounter (Signed)
Per KK note below, let pt know her bone density scan looked good and MD wants her to take OTC Vit D3 (1000-2000 units/day) and Calcium (1000-1200) supplement daily.  Pt voiced understanding.

## 2013-07-30 NOTE — Telephone Encounter (Signed)
LMOVM requesting call call back.

## 2013-08-10 NOTE — Progress Notes (Signed)
Rcvd report from [facility] 07/29/13.  Provided to [place].

## 2013-08-13 ENCOUNTER — Other Ambulatory Visit (HOSPITAL_BASED_OUTPATIENT_CLINIC_OR_DEPARTMENT_OTHER): Payer: No Typology Code available for payment source

## 2013-08-13 ENCOUNTER — Other Ambulatory Visit: Payer: Self-pay

## 2013-08-13 ENCOUNTER — Telehealth: Payer: Self-pay | Admitting: Oncology

## 2013-08-13 ENCOUNTER — Ambulatory Visit (HOSPITAL_BASED_OUTPATIENT_CLINIC_OR_DEPARTMENT_OTHER): Payer: PRIVATE HEALTH INSURANCE | Admitting: Oncology

## 2013-08-13 VITALS — BP 139/99 | HR 83 | Temp 98.7°F | Resp 19 | Ht 67.5 in | Wt 166.0 lb

## 2013-08-13 DIAGNOSIS — C50519 Malignant neoplasm of lower-outer quadrant of unspecified female breast: Secondary | ICD-10-CM

## 2013-08-13 DIAGNOSIS — N951 Menopausal and female climacteric states: Secondary | ICD-10-CM

## 2013-08-13 DIAGNOSIS — Z170 Estrogen receptor positive status [ER+]: Secondary | ICD-10-CM

## 2013-08-13 DIAGNOSIS — E559 Vitamin D deficiency, unspecified: Secondary | ICD-10-CM

## 2013-08-13 DIAGNOSIS — Z5111 Encounter for antineoplastic chemotherapy: Secondary | ICD-10-CM

## 2013-08-13 LAB — CBC WITH DIFFERENTIAL/PLATELET
BASO%: 0.5 % (ref 0.0–2.0)
Basophils Absolute: 0 10*3/uL (ref 0.0–0.1)
EOS ABS: 0 10*3/uL (ref 0.0–0.5)
EOS%: 0.7 % (ref 0.0–7.0)
HCT: 35.7 % (ref 34.8–46.6)
HEMOGLOBIN: 10.9 g/dL — AB (ref 11.6–15.9)
LYMPH%: 40 % (ref 14.0–49.7)
MCH: 22.6 pg — ABNORMAL LOW (ref 25.1–34.0)
MCHC: 30.5 g/dL — ABNORMAL LOW (ref 31.5–36.0)
MCV: 74 fL — ABNORMAL LOW (ref 79.5–101.0)
MONO#: 0.4 10*3/uL (ref 0.1–0.9)
MONO%: 11 % (ref 0.0–14.0)
NEUT%: 47.8 % (ref 38.4–76.8)
NEUTROS ABS: 1.6 10*3/uL (ref 1.5–6.5)
PLATELETS: 213 10*3/uL (ref 145–400)
RBC: 4.82 10*6/uL (ref 3.70–5.45)
RDW: 15.6 % — ABNORMAL HIGH (ref 11.2–14.5)
WBC: 3.3 10*3/uL — ABNORMAL LOW (ref 3.9–10.3)
lymph#: 1.3 10*3/uL (ref 0.9–3.3)

## 2013-08-13 LAB — COMPREHENSIVE METABOLIC PANEL (CC13)
ALBUMIN: 3.6 g/dL (ref 3.5–5.0)
ALT: 33 U/L (ref 0–55)
ANION GAP: 8 meq/L (ref 3–11)
AST: 34 U/L (ref 5–34)
Alkaline Phosphatase: 124 U/L (ref 40–150)
BILIRUBIN TOTAL: 0.24 mg/dL (ref 0.20–1.20)
BUN: 9.7 mg/dL (ref 7.0–26.0)
CO2: 25 meq/L (ref 22–29)
Calcium: 9.1 mg/dL (ref 8.4–10.4)
Chloride: 109 mEq/L (ref 98–109)
Creatinine: 0.8 mg/dL (ref 0.6–1.1)
GLUCOSE: 84 mg/dL (ref 70–140)
POTASSIUM: 4.1 meq/L (ref 3.5–5.1)
Sodium: 142 mEq/L (ref 136–145)
TOTAL PROTEIN: 7.3 g/dL (ref 6.4–8.3)

## 2013-08-13 MED ORDER — GOSERELIN ACETATE 3.6 MG ~~LOC~~ IMPL
3.6000 mg | DRUG_IMPLANT | Freq: Once | SUBCUTANEOUS | Status: AC
  Administered 2013-08-13: 3.6 mg via SUBCUTANEOUS
  Filled 2013-08-13: qty 3.6

## 2013-08-13 MED ORDER — ERGOCALCIFEROL 1.25 MG (50000 UT) PO CAPS: 50000.0000 [IU] | ORAL_CAPSULE | ORAL | Status: DC

## 2013-08-13 MED ORDER — LIDOCAINE-PRILOCAINE 2.5-2.5 % EX CREA: TOPICAL_CREAM | CUTANEOUS | Status: DC | PRN

## 2013-08-13 NOTE — Patient Instructions (Signed)
Proceed with aromasin 25 mg daily  Continue zoladex  q monthly  Take vitamin D 50,000 units q week  We will see you back in 6 months for follow up

## 2013-08-13 NOTE — Progress Notes (Signed)
Per pharmacy, ok to administer zoladex injection one day early.  Pt received injection while in MD exam room.  Injection RN notified.  POF entered.

## 2013-08-13 NOTE — Telephone Encounter (Signed)
ADDED LB/FU FOR 10/16 AND ADDITIIONAL INJ APPTS STARTING 6/12 THRU 10/30. S/W PT SHE IS AWARE AND WILL GET NEW SCHEUDLE 5/15.

## 2013-08-14 ENCOUNTER — Ambulatory Visit: Payer: No Typology Code available for payment source

## 2013-08-31 ENCOUNTER — Ambulatory Visit: Payer: No Typology Code available for payment source | Admitting: Family

## 2013-09-11 ENCOUNTER — Ambulatory Visit (HOSPITAL_BASED_OUTPATIENT_CLINIC_OR_DEPARTMENT_OTHER): Payer: PRIVATE HEALTH INSURANCE

## 2013-09-11 VITALS — BP 142/92 | HR 80 | Temp 98.4°F

## 2013-09-11 DIAGNOSIS — Z5111 Encounter for antineoplastic chemotherapy: Secondary | ICD-10-CM

## 2013-09-11 DIAGNOSIS — C50519 Malignant neoplasm of lower-outer quadrant of unspecified female breast: Secondary | ICD-10-CM

## 2013-09-11 MED ORDER — GOSERELIN ACETATE 3.6 MG ~~LOC~~ IMPL
3.6000 mg | DRUG_IMPLANT | Freq: Once | SUBCUTANEOUS | Status: AC
  Administered 2013-09-11: 3.6 mg via SUBCUTANEOUS
  Filled 2013-09-11: qty 3.6

## 2013-09-16 ENCOUNTER — Ambulatory Visit (INDEPENDENT_AMBULATORY_CARE_PROVIDER_SITE_OTHER): Payer: 59 | Admitting: Family

## 2013-09-16 ENCOUNTER — Telehealth: Payer: Self-pay | Admitting: Family

## 2013-09-16 ENCOUNTER — Encounter: Payer: Self-pay | Admitting: Family

## 2013-09-16 VITALS — BP 130/88 | HR 82 | Temp 98.0°F | Resp 16 | Ht 67.5 in | Wt 164.1 lb

## 2013-09-16 DIAGNOSIS — F3289 Other specified depressive episodes: Secondary | ICD-10-CM

## 2013-09-16 DIAGNOSIS — C50919 Malignant neoplasm of unspecified site of unspecified female breast: Secondary | ICD-10-CM

## 2013-09-16 DIAGNOSIS — C50519 Malignant neoplasm of lower-outer quadrant of unspecified female breast: Secondary | ICD-10-CM

## 2013-09-16 DIAGNOSIS — R079 Chest pain, unspecified: Secondary | ICD-10-CM

## 2013-09-16 DIAGNOSIS — R0781 Pleurodynia: Secondary | ICD-10-CM

## 2013-09-16 DIAGNOSIS — F329 Major depressive disorder, single episode, unspecified: Secondary | ICD-10-CM

## 2013-09-16 DIAGNOSIS — F32A Depression, unspecified: Secondary | ICD-10-CM

## 2013-09-16 MED ORDER — VENLAFAXINE HCL ER 150 MG PO CP24: 150.0000 mg | ORAL_CAPSULE | Freq: Every day | ORAL | Status: DC

## 2013-09-16 NOTE — Progress Notes (Signed)
Pre visit review using our clinic review tool, if applicable. No additional management support is needed unless otherwise documented below in the visit note. 
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
## 2013-09-16 NOTE — Assessment & Plan Note (Addendum)
Not optimally controlled. Increase effexor xr  from 75 to 150mg . Continue citalopram.

## 2013-09-16 NOTE — Patient Instructions (Addendum)
Increase Effexor from 75mg  to 150mg . Please follow up in 6 weeks.

## 2013-09-16 NOTE — Assessment & Plan Note (Signed)
Refer to Dr. Marin Olp for ongoing management.

## 2013-09-16 NOTE — Telephone Encounter (Signed)
Please contact pt and let her know that I am not able to confirm 100% but from what I am told, Dr. Humphrey Rolls is not expected to return.  I will initiate referral to Dr. Marin Olp.

## 2013-09-16 NOTE — Telephone Encounter (Signed)
Left message on voicemail to return my call.  

## 2013-09-16 NOTE — Assessment & Plan Note (Signed)
Likely related to scar tissue from reconstruction.

## 2013-09-23 NOTE — Telephone Encounter (Signed)
Left message for pt to return my call.

## 2013-09-28 NOTE — Telephone Encounter (Signed)
Left message for pt to return my call.

## 2013-09-29 ENCOUNTER — Encounter: Payer: Self-pay | Admitting: Family

## 2013-09-29 ENCOUNTER — Ambulatory Visit (INDEPENDENT_AMBULATORY_CARE_PROVIDER_SITE_OTHER): Payer: 59 | Admitting: Family

## 2013-09-29 VITALS — BP 120/88 | HR 85 | Temp 98.3°F | Resp 16 | Ht 67.5 in | Wt 165.0 lb

## 2013-09-29 DIAGNOSIS — R7309 Other abnormal glucose: Secondary | ICD-10-CM

## 2013-09-29 DIAGNOSIS — C50519 Malignant neoplasm of lower-outer quadrant of unspecified female breast: Secondary | ICD-10-CM

## 2013-09-29 DIAGNOSIS — R739 Hyperglycemia, unspecified: Secondary | ICD-10-CM

## 2013-09-29 DIAGNOSIS — E162 Hypoglycemia, unspecified: Secondary | ICD-10-CM

## 2013-09-29 LAB — POCT CBG (FASTING - GLUCOSE)-MANUAL ENTRY: Glucose Fasting, POC: 90 mg/dL (ref 70–99)

## 2013-09-29 NOTE — Patient Instructions (Addendum)
Call if you develop dizziness/weakness/shakiness.   Follow up next month as scheduled.

## 2013-09-29 NOTE — Assessment & Plan Note (Signed)
Our lab work indicates hyperglycemia overall not hypo. She was asymptomatic at that time.  ? Accuracy of machine. We reviewed symptoms of hypoglycemia and I have advised the pt to call us if these occure.
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
## 2013-09-29 NOTE — Assessment & Plan Note (Signed)
Will initiate referral to oncologist at [facility].

## 2013-09-29 NOTE — Progress Notes (Signed)
Pre visit review using our clinic review tool, if applicable. No additional management support is needed unless otherwise documented below in the visit note. 

## 2013-09-30 ENCOUNTER — Telehealth: Payer: Self-pay | Admitting: Oncology

## 2013-09-30 NOTE — Telephone Encounter (Signed)
Faxed pt medical records to [facility],slides and scans will be fedex'ed

## 2013-10-09 ENCOUNTER — Ambulatory Visit (HOSPITAL_BASED_OUTPATIENT_CLINIC_OR_DEPARTMENT_OTHER): Payer: PRIVATE HEALTH INSURANCE

## 2013-10-09 ENCOUNTER — Other Ambulatory Visit: Payer: Self-pay | Admitting: Adult Health

## 2013-10-09 VITALS — BP 151/86 | HR 79 | Temp 98.3°F

## 2013-10-09 DIAGNOSIS — C50519 Malignant neoplasm of lower-outer quadrant of unspecified female breast: Secondary | ICD-10-CM

## 2013-10-09 DIAGNOSIS — Z5111 Encounter for antineoplastic chemotherapy: Secondary | ICD-10-CM

## 2013-10-09 MED ORDER — GOSERELIN ACETATE 3.6 MG ~~LOC~~ IMPL
3.6000 mg | DRUG_IMPLANT | Freq: Once | SUBCUTANEOUS | Status: AC
  Administered 2013-10-09: 3.6 mg via SUBCUTANEOUS
  Filled 2013-10-09: qty 3.6

## 2013-10-28 ENCOUNTER — Ambulatory Visit: Payer: No Typology Code available for payment source | Admitting: Family
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
## 2013-11-06 ENCOUNTER — Ambulatory Visit: Payer: No Typology Code available for payment source

## 2013-11-09 ENCOUNTER — Encounter: Payer: Self-pay | Admitting: Family

## 2013-11-09 ENCOUNTER — Ambulatory Visit (INDEPENDENT_AMBULATORY_CARE_PROVIDER_SITE_OTHER): Payer: PRIVATE HEALTH INSURANCE | Admitting: Family

## 2013-11-09 ENCOUNTER — Telehealth: Payer: Self-pay | Admitting: Family

## 2013-11-09 VITALS — BP 140/90 | HR 91 | Temp 98.4°F | Resp 18 | Ht 67.5 in | Wt 167.1 lb

## 2013-11-09 DIAGNOSIS — F3289 Other specified depressive episodes: Secondary | ICD-10-CM

## 2013-11-09 DIAGNOSIS — F329 Major depressive disorder, single episode, unspecified: Secondary | ICD-10-CM

## 2013-11-09 DIAGNOSIS — D509 Iron deficiency anemia, unspecified: Secondary | ICD-10-CM

## 2013-11-09 DIAGNOSIS — F32A Depression, unspecified: Secondary | ICD-10-CM

## 2013-11-09 DIAGNOSIS — E538 Deficiency of other specified B group vitamins: Secondary | ICD-10-CM

## 2013-11-09 DIAGNOSIS — G629 Polyneuropathy, unspecified: Secondary | ICD-10-CM

## 2013-11-09 DIAGNOSIS — G589 Mononeuropathy, unspecified: Secondary | ICD-10-CM

## 2013-11-09 DIAGNOSIS — D649 Anemia, unspecified: Secondary | ICD-10-CM | POA: Insufficient documentation

## 2013-11-09 NOTE — Assessment & Plan Note (Signed)
We discussed continuation of gabapentin, and using care on stairs and with walking. Will have pt complete b12 and folate levels as well- see phone note.

## 2013-11-09 NOTE — Addendum Note (Signed)
Addended by: Kelle Darting A on: 11/09/2013 04:32 PM   Modules accepted: Orders
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
## 2013-11-09 NOTE — Patient Instructions (Signed)
Please complete stool kit and return by mail. Follow up in 4 months.

## 2013-11-09 NOTE — Telephone Encounter (Signed)
Advised pt to return to the lab at her convenience for b12 and folate levels. She reports that Dr. Humphrey Rolls told her that she had "low vitamin B". She takes oral supplement but reports that she has been on b12 injections remotely.

## 2013-11-09 NOTE — Assessment & Plan Note (Signed)
Improved on citalopram and effexor.

## 2013-11-09 NOTE — Assessment & Plan Note (Signed)
S/p IV iron per oncology.  I have asked pt to complete IFOB to see if she has gi loss given hx of gastric bypass.  GI referral if +.

## 2013-11-09 NOTE — Progress Notes (Signed)
Pre visit review using our clinic review tool, if applicable. No additional management support is needed unless otherwise documented below in the visit note. 

## 2013-11-14 LAB — VITAMIN B12: Vitamin B-12: 364 pg/mL (ref 211–911)

## 2013-11-14 LAB — FOLATE: FOLATE: 9.2 ng/mL

## 2013-11-15 ENCOUNTER — Encounter: Payer: Self-pay | Admitting: Family

## 2013-11-17 ENCOUNTER — Other Ambulatory Visit: Payer: Self-pay | Admitting: Family

## 2013-11-17 ENCOUNTER — Telehealth: Payer: Self-pay | Admitting: *Deleted

## 2013-11-17 MED ORDER — VENLAFAXINE HCL ER 150 MG PO CP24: 150.0000 mg | ORAL_CAPSULE | Freq: Every day | ORAL | Status: DC

## 2013-11-17 NOTE — Telephone Encounter (Signed)
Received fax from [place] requesting refill of venlafaxine. Refill sent, #90 x 1 refill.

## 2013-12-01 ENCOUNTER — Other Ambulatory Visit: Payer: Self-pay

## 2013-12-04 ENCOUNTER — Telehealth: Payer: Self-pay | Admitting: *Deleted

## 2013-12-04 ENCOUNTER — Ambulatory Visit: Payer: No Typology Code available for payment source

## 2013-12-04 NOTE — Telephone Encounter (Signed)
Called patient on cell number.  Left message to return call about injection appointment that was missed today.

## 2013-12-09 ENCOUNTER — Encounter: Payer: Self-pay | Admitting: Family

## 2013-12-09 ENCOUNTER — Ambulatory Visit (INDEPENDENT_AMBULATORY_CARE_PROVIDER_SITE_OTHER): Payer: 59 | Admitting: Family

## 2013-12-09 VITALS — BP 130/90 | HR 69 | Temp 98.3°F | Resp 14 | Ht 67.0 in | Wt 166.1 lb

## 2013-12-09 DIAGNOSIS — Z0000 Encounter for general adult medical examination without abnormal findings: Secondary | ICD-10-CM

## 2013-12-09 LAB — LIPID PANEL
CHOLESTEROL: 156 mg/dL (ref 0–200)
HDL: 65 mg/dL (ref >39)
LDL Cholesterol: 79 mg/dL (ref 0–99)
Total CHOL/HDL Ratio: 2.4 Ratio
Triglycerides: 62 mg/dL (ref <150)
VLDL: 12 mg/dL (ref 0–40)

## 2013-12-09 LAB — CBC WITH DIFFERENTIAL/PLATELET
BASOS PCT: 0 % (ref 0–1)
Basophils Absolute: 0 10*3/uL (ref 0.0–0.1)
EOS ABS: 0 10*3/uL (ref 0.0–0.7)
EOS PCT: 1 % (ref 0–5)
HCT: 42.2 % (ref 36.0–46.0)
Hemoglobin: 14.2 g/dL (ref 12.0–15.0)
LYMPHS ABS: 1.2 10*3/uL (ref 0.7–4.0)
Lymphocytes Relative: 51 % — ABNORMAL HIGH (ref 12–46)
MCH: 25.2 pg — AB (ref 26.0–34.0)
MCHC: 33.6 g/dL (ref 30.0–36.0)
MCV: 75 fL — AB (ref 78.0–100.0)
Monocytes Absolute: 0.1 10*3/uL (ref 0.1–1.0)
Monocytes Relative: 6 % (ref 3–12)
NEUTROS PCT: 42 % — AB (ref 43–77)
Neutro Abs: 1 10*3/uL — ABNORMAL LOW (ref 1.7–7.7)
PLATELETS: 180 10*3/uL (ref 150–400)
RBC: 5.63 MIL/uL — AB (ref 3.87–5.11)
RDW: 22 % — AB (ref 11.5–15.5)
WBC: 2.4 10*3/uL — ABNORMAL LOW (ref 4.0–10.5)

## 2013-12-09 LAB — BASIC METABOLIC PANEL WITH GFR
BUN: 9 mg/dL (ref 6–23)
CALCIUM: 8.9 mg/dL (ref 8.4–10.5)
CO2: 24 meq/L (ref 19–32)
CREATININE: 0.8 mg/dL (ref 0.50–1.10)
Chloride: 106 mEq/L (ref 96–112)
GFR, Est Non African American: >89 mL/min
Glucose, Bld: 85 mg/dL (ref 70–99)
Potassium: 4.4 mEq/L (ref 3.5–5.3)
Sodium: 140 mEq/L (ref 135–145)

## 2013-12-09 LAB — HEPATIC FUNCTION PANEL
ALBUMIN: 4.2 g/dL (ref 3.5–5.2)
ALT: 35 U/L (ref 0–35)
AST: 26 U/L (ref 0–37)
Alkaline Phosphatase: 141 U/L — ABNORMAL HIGH (ref 39–117)
Bilirubin, Direct: 0.1 mg/dL (ref 0.0–0.3)
Indirect Bilirubin: 0.2 mg/dL (ref 0.2–1.2)
Total Bilirubin: 0.3 mg/dL (ref 0.2–1.2)
Total Protein: 7.1 g/dL (ref 6.0–8.3)

## 2013-12-09 LAB — TSH: TSH: 0.911 u[IU]/mL (ref 0.350–4.500)

## 2013-12-09 NOTE — Assessment & Plan Note (Addendum)
Continue healthy diet, encouraged increase exercise. She will schedule mammogram for late October. Obtain fasting lab work. Immunizations reviewed and up to date. EKG today.

## 2013-12-09 NOTE — Progress Notes (Signed)
Pre visit review using our clinic review tool, if applicable. No additional management support is needed unless otherwise documented below in the visit note. 

## 2013-12-09 NOTE — Patient Instructions (Addendum)
Continue healthy diet. Try to get regular exercise. Complete lab work prior to leaving. Follow up in 6 months, sooner if problems/concerns.
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
## 2013-12-10 LAB — URINALYSIS, ROUTINE W REFLEX MICROSCOPIC
Bilirubin Urine: NEGATIVE
GLUCOSE, UA: NEGATIVE mg/dL
Hgb urine dipstick: NEGATIVE
Ketones, ur: NEGATIVE mg/dL
LEUKOCYTES UA: NEGATIVE
NITRITE: NEGATIVE
PH: 7.5 (ref 5.0–8.0)
Protein, ur: NEGATIVE mg/dL
SPECIFIC GRAVITY, URINE: 1.018 (ref 1.005–1.030)
Urobilinogen, UA: 1 mg/dL (ref 0.0–1.0)

## 2013-12-10 LAB — HIV ANTIBODY (ROUTINE TESTING W REFLEX): HIV 1&2 Ab, 4th Generation: NONREACTIVE

## 2013-12-19 ENCOUNTER — Ambulatory Visit: Payer: PRIVATE HEALTH INSURANCE | Admitting: Family Medicine

## 2013-12-21 ENCOUNTER — Encounter: Payer: Self-pay | Admitting: Medical

## 2013-12-21 ENCOUNTER — Ambulatory Visit: Payer: PRIVATE HEALTH INSURANCE | Admitting: Physician Assistant

## 2013-12-21 ENCOUNTER — Ambulatory Visit (INDEPENDENT_AMBULATORY_CARE_PROVIDER_SITE_OTHER): Payer: 59 | Admitting: Medical

## 2013-12-21 VITALS — BP 120/80 | HR 85 | Temp 98.0°F | Wt 166.0 lb

## 2013-12-21 DIAGNOSIS — R112 Nausea with vomiting, unspecified: Secondary | ICD-10-CM

## 2013-12-21 DIAGNOSIS — R1011 Right upper quadrant pain: Secondary | ICD-10-CM

## 2013-12-21 LAB — COMPLETE METABOLIC PANEL WITH GFR
ALT: 32 U/L (ref 0–35)
AST: 35 U/L (ref 0–37)
Albumin: 4.5 g/dL (ref 3.5–5.2)
Alkaline Phosphatase: 133 U/L — ABNORMAL HIGH (ref 39–117)
BUN: 6 mg/dL (ref 6–23)
CO2: 19 mEq/L (ref 19–32)
Calcium: 9.4 mg/dL (ref 8.4–10.5)
Chloride: 107 mEq/L (ref 96–112)
Creat: 0.69 mg/dL (ref 0.50–1.10)
GFR, Est African American: >89 mL/min
GFR, Est Non African American: >89 mL/min
Glucose, Bld: 67 mg/dL — ABNORMAL LOW (ref 70–99)
Potassium: 4.1 mEq/L (ref 3.5–5.3)
SODIUM: 142 meq/L (ref 135–145)
TOTAL PROTEIN: 7.7 g/dL (ref 6.0–8.3)
Total Bilirubin: 0.3 mg/dL (ref 0.2–1.2)

## 2013-12-21 LAB — CBC WITH DIFFERENTIAL/PLATELET
Basophils Absolute: 0 10*3/uL (ref 0.0–0.1)
Basophils Relative: 0.1 % (ref 0.0–3.0)
EOS PCT: 0.7 % (ref 0.0–5.0)
Eosinophils Absolute: 0 10*3/uL (ref 0.0–0.7)
HCT: 43.9 % (ref 36.0–46.0)
HEMOGLOBIN: 14.1 g/dL (ref 12.0–15.0)
Lymphocytes Relative: 40.8 % (ref 12.0–46.0)
Lymphs Abs: 1 10*3/uL (ref 0.7–4.0)
MCHC: 32.1 g/dL (ref 30.0–36.0)
MCV: 79 fl (ref 78.0–100.0)
Monocytes Absolute: 0.2 10*3/uL (ref 0.1–1.0)
Monocytes Relative: 7.9 % (ref 3.0–12.0)
NEUTROS PCT: 50.5 % (ref 43.0–77.0)
Neutro Abs: 1.3 10*3/uL — ABNORMAL LOW (ref 1.4–7.7)
Platelets: 181 10*3/uL (ref 150.0–400.0)
RDW: 23.2 % — ABNORMAL HIGH (ref 11.5–15.5)
WBC: 2.5 10*3/uL — ABNORMAL LOW (ref 4.0–10.5)

## 2013-12-21 LAB — LIPASE: LIPASE: 27 U/L (ref 11.0–59.0)

## 2013-12-21 MED ORDER — OMEPRAZOLE 20 MG PO CPDR: 20.0000 mg | DELAYED_RELEASE_CAPSULE | Freq: Every day | ORAL | Status: DC

## 2013-12-21 MED ORDER — ONDANSETRON 8 MG PO TBDP: 8.0000 mg | ORAL_TABLET | Freq: Three times a day (TID) | ORAL | Status: DC | PRN

## 2013-12-21 NOTE — Progress Notes (Signed)
   Subjective:    Patient ID: Tammie [name], female    DOB: 12-07-1970, 43 y.o.   MRN: [id number]  HPI  Pt states she has had episodes of some nausea and vomiting about 3 times in last month and half.(Associated with some ruq pain and some back pain)Hx of breast cancer 2014 in august. Finished treatments and reconstructive surgery. Pt stills see her oncologist. That appointment is October.  Pt has no diarrhea with these episodes. Pt has some constipation. She has this all the time/baseline. Last bowel movement day before yesterday. Last vomiting episode was Thursday. She vomited 8-10 times. On Saturday no vomiting. Pt appetitive decreased over weekend. Sunday felt better. Appetite improved. Over weekend no vomiting. Pt does not drink alcohol. Pt stomach hurts occasionally after eating. Breads, pasta and dairy seem to upset stomach.   Pt had hysterectomy.  Pt states with vomiting episodes her abdomen does hurt. But occasional pain dull rt upper quadrant pain.    Review of Systems  Constitutional: Negative for fever and chills.  Respiratory: Negative for chest tightness, shortness of breath and wheezing.   Cardiovascular: Negative for chest pain and palpitations.  Gastrointestinal: Negative for nausea, abdominal pain, diarrhea, constipation and abdominal distention.       None presently but at times does have with nausea and vomiting. Occasional mild faint pain often in ruq.  Musculoskeletal: Negative for back pain, joint swelling, myalgias and neck pain.       With nausea occasional rt side parthorcic pain mid region.  Skin: Negative for color change, rash and wound.  Neurological: Negative for dizziness, seizures, syncope, weakness, numbness and headaches.  Hematological: Negative for adenopathy. Does not bruise/bleed easily.       Objective:   Physical Exam  General Appearance- Not in acute distress.  HEENT Eyes- Scleraeral/Conjuntiva-bilat- Not Yellow. Mouth & Throat-  Normal.  Chest and Lung Exam Auscultation: Breath sounds:-Normal. Adventitious sounds:- No Adventitious sounds. Clear even and unlabored.  Cardiovascular Auscultation:Rythm - Regular. Heart Sounds -Normal heart sounds.  Abdomen Inspection:-Inspection Normal.  Palpation/Perucssion: Palpation - Very faint minimal Tender ruq only, No Rebound tenderness, No rigidity(Guarding) and No Palpable abdominal masses.  Liver:-Normal.  Spleen:- Normal.   Back No cva tenderness. Rt para thoracic area non tender today.           Assessment & Plan:

## 2013-12-21 NOTE — Patient Instructions (Addendum)
For your nausea and vomiting I do want to do labs today. I also will give your zofran prescription and prilosec. Eat healthy and will follow lab. Since occasional rt upper quadrant pain with some back pain will go ahead and schedule abdominal ultrasound. If you have episodes of nausea and vomiting with extreme abdominal pain or back pain pending lab and ultrasound results then ED or UC. Follow up in 2 wks or as needed.

## 2013-12-21 NOTE — Assessment & Plan Note (Signed)
None presently now except for fain on exam but when does have associated with nausea and vomiting when events occure. Also some occasional back pain rt mid para thoracic region. Ordered labs, cbc, cmp, lipase and will get abdominal ultrasound. Rx zofran and omeprazole today.

## 2013-12-21 NOTE — Progress Notes (Signed)
Pre visit review using our clinic review tool, if applicable. No additional management support is needed unless otherwise documented below in the visit note. 

## 2013-12-23 ENCOUNTER — Telehealth: Payer: Self-pay | Admitting: Medical

## 2013-12-23 ENCOUNTER — Telehealth: Payer: Self-pay | Admitting: Family

## 2013-12-23 DIAGNOSIS — D72819 Decreased white blood cell count, unspecified: Secondary | ICD-10-CM

## 2013-12-23 NOTE — Telephone Encounter (Signed)
Wrote referral to hematology. Referral staff will call pt.

## 2013-12-23 NOTE — Telephone Encounter (Signed)
Patient states that she had to cancel the [place] that was ordered by Einar Pheasant and says that she will talk to [facility] about it next time she comes in for a visit.

## 2013-12-24 ENCOUNTER — Ambulatory Visit (HOSPITAL_BASED_OUTPATIENT_CLINIC_OR_DEPARTMENT_OTHER): Payer: PRIVATE HEALTH INSURANCE

## 2013-12-26 ENCOUNTER — Encounter: Payer: Self-pay | Admitting: Family

## 2013-12-28 ENCOUNTER — Telehealth: Payer: Self-pay | Admitting: Hematology & Oncology

## 2013-12-28 ENCOUNTER — Encounter: Payer: Self-pay | Admitting: *Deleted

## 2013-12-28 NOTE — Telephone Encounter (Signed)
Pt made 9-24

## 2014-01-01 ENCOUNTER — Ambulatory Visit: Payer: No Typology Code available for payment source

## 2014-01-20 ENCOUNTER — Telehealth: Payer: Self-pay | Admitting: Hematology & Oncology

## 2014-01-20 NOTE — Telephone Encounter (Signed)
Left vm w NEW PATIENT today to remind them of their appointment with Dr. Ennever. Also, advised them to bring all medication bottles and insurance card information. ° °

## 2014-01-21 ENCOUNTER — Ambulatory Visit: Payer: PRIVATE HEALTH INSURANCE

## 2014-01-21 ENCOUNTER — Other Ambulatory Visit (HOSPITAL_BASED_OUTPATIENT_CLINIC_OR_DEPARTMENT_OTHER): Payer: 59 | Admitting: Lab

## 2014-01-21 ENCOUNTER — Ambulatory Visit (HOSPITAL_BASED_OUTPATIENT_CLINIC_OR_DEPARTMENT_OTHER): Payer: PRIVATE HEALTH INSURANCE | Admitting: Hematology & Oncology

## 2014-01-21 ENCOUNTER — Encounter: Payer: Self-pay | Admitting: Hematology & Oncology

## 2014-01-21 VITALS — BP 138/73 | HR 85 | Temp 97.8°F | Resp 14 | Ht 67.0 in | Wt 170.0 lb

## 2014-01-21 DIAGNOSIS — C50519 Malignant neoplasm of lower-outer quadrant of unspecified female breast: Secondary | ICD-10-CM

## 2014-01-21 DIAGNOSIS — D72819 Decreased white blood cell count, unspecified: Secondary | ICD-10-CM

## 2014-01-21 DIAGNOSIS — C50511 Malignant neoplasm of lower-outer quadrant of right female breast: Secondary | ICD-10-CM

## 2014-01-21 DIAGNOSIS — Z8052 Family history of malignant neoplasm of bladder: Secondary | ICD-10-CM

## 2014-01-21 LAB — CMP (CANCER CENTER ONLY)
ALK PHOS: 106 U/L — AB (ref 26–84)
ALT(SGPT): 37 U/L (ref 10–47)
AST: 31 U/L (ref 11–38)
Albumin: 3.5 g/dL (ref 3.3–5.5)
BUN: 8 mg/dL (ref 7–22)
CO2: 24 mEq/L (ref 18–33)
CREATININE: 1 mg/dL (ref 0.6–1.2)
Calcium: 8.9 mg/dL (ref 8.0–10.3)
Chloride: 108 mEq/L (ref 98–108)
GLUCOSE: 141 mg/dL — AB (ref 73–118)
POTASSIUM: 3.7 meq/L (ref 3.3–4.7)
Sodium: 143 mEq/L (ref 128–145)
Total Bilirubin: 0.5 mg/dl (ref 0.20–1.60)
Total Protein: 7.1 g/dL (ref 6.4–8.1)

## 2014-01-21 LAB — CBC WITH DIFFERENTIAL (CANCER CENTER ONLY)
BASO#: 0 10*3/uL (ref 0.0–0.2)
BASO%: 0 % (ref 0.0–2.0)
EOS%: 0.7 % (ref 0.0–7.0)
Eosinophils Absolute: 0 10*3/uL (ref 0.0–0.5)
HEMATOCRIT: 40.6 % (ref 34.8–46.6)
HEMOGLOBIN: 13.4 g/dL (ref 11.6–15.9)
LYMPH#: 1.4 10*3/uL (ref 0.9–3.3)
LYMPH%: 49.6 % — AB (ref 14.0–48.0)
MCH: 26.9 pg (ref 26.0–34.0)
MCHC: 33 g/dL (ref 32.0–36.0)
MCV: 82 fL (ref 81–101)
MONO#: 0.2 10*3/uL (ref 0.1–0.9)
MONO%: 8.5 % (ref 0.0–13.0)
NEUT#: 1.2 10*3/uL — ABNORMAL LOW (ref 1.5–6.5)
NEUT%: 41.2 % (ref 39.6–80.0)
Platelets: 166 10*3/uL (ref 145–400)
RBC: 4.98 10*6/uL (ref 3.70–5.32)
RDW: 18 % — AB (ref 11.1–15.7)
WBC: 2.8 10*3/uL — ABNORMAL LOW (ref 3.9–10.0)

## 2014-01-21 LAB — CHCC SATELLITE - SMEAR

## 2014-01-21 MED ORDER — ERGOCALCIFEROL 1.25 MG (50000 UT) PO CAPS: 50000.0000 [IU] | ORAL_CAPSULE | ORAL | Status: DC

## 2014-01-22 ENCOUNTER — Other Ambulatory Visit: Payer: Self-pay | Admitting: Family

## 2014-01-22 ENCOUNTER — Telehealth: Payer: Self-pay | Admitting: Hematology & Oncology

## 2014-01-22 DIAGNOSIS — Z1231 Encounter for screening mammogram for malignant neoplasm of breast: Secondary | ICD-10-CM

## 2014-01-22 NOTE — Telephone Encounter (Signed)
Left pt message to call for details of 9-30 and 1-21 appointments
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
## 2014-01-23 LAB — VITAMIN D 25 HYDROXY (VIT D DEFICIENCY, FRACTURES): Vit D, 25-Hydroxy: 20 ng/mL — ABNORMAL LOW (ref 30–89)

## 2014-01-23 LAB — FOLLICLE STIMULATING HORMONE: FSH: 28.3 m[IU]/mL

## 2014-01-23 LAB — LUTEINIZING HORMONE: LH: 20.9 m[IU]/mL

## 2014-01-27 ENCOUNTER — Ambulatory Visit: Payer: PRIVATE HEALTH INSURANCE

## 2014-01-29 ENCOUNTER — Ambulatory Visit: Payer: No Typology Code available for payment source

## 2014-02-03 ENCOUNTER — Encounter: Payer: Self-pay | Admitting: Family

## 2014-02-03 ENCOUNTER — Other Ambulatory Visit: Payer: Self-pay

## 2014-02-03 ENCOUNTER — Ambulatory Visit (INDEPENDENT_AMBULATORY_CARE_PROVIDER_SITE_OTHER): Payer: PRIVATE HEALTH INSURANCE | Admitting: Family

## 2014-02-03 VITALS — BP 128/91 | HR 83 | Temp 97.8°F | Resp 16 | Ht 67.0 in | Wt 169.4 lb

## 2014-02-03 DIAGNOSIS — T148 Other injury of unspecified body region: Secondary | ICD-10-CM

## 2014-02-03 DIAGNOSIS — R110 Nausea: Secondary | ICD-10-CM

## 2014-02-03 DIAGNOSIS — D72819 Decreased white blood cell count, unspecified: Secondary | ICD-10-CM

## 2014-02-03 DIAGNOSIS — R195 Other fecal abnormalities: Secondary | ICD-10-CM

## 2014-02-03 DIAGNOSIS — W57XXXA Bitten or stung by nonvenomous insect and other nonvenomous arthropods, initial encounter: Secondary | ICD-10-CM

## 2014-02-03 DIAGNOSIS — Z23 Encounter for immunization: Secondary | ICD-10-CM

## 2014-02-03 MED ORDER — FEXOFENADINE HCL 180 MG PO TABS: 180.0000 mg | ORAL_TABLET | Freq: Every day | ORAL | Status: DC

## 2014-02-03 NOTE — Assessment & Plan Note (Signed)
Reports that she has also had "cedar red stools" but not frank blood. Reports that she mailed back IFOB but unfortunately, the lab did not receive the sample. She is agreeable to repeat IFOB to evaluate for rectal bleeding.  Will obtain abd [place] to evaluate GB.  If normal GB on ultrasound, consider referral to GI for further evaluation. Irritable bowel is in the differential.
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
## 2014-02-03 NOTE — Progress Notes (Signed)
Pre visit review using our clinic review tool, if applicable. No additional management support is needed unless otherwise documented below in the visit note. 

## 2014-02-03 NOTE — Assessment & Plan Note (Signed)
Pt evaluated by Dr. Marin Olp and this is felt to be benign. She plans to continue her oncology follow up at [facility].

## 2014-02-03 NOTE — Assessment & Plan Note (Signed)
I am not sure that these bites are bed bugs as her boyfriend did not suffer any bites. Nonetheless, she plans to have an exterminator come to her home to perform a treatment. In the meantime, advised allegra and hydroctisone cream as needed for itching.

## 2014-02-03 NOTE — Patient Instructions (Signed)
You will be contacted about your abdominal US. Please complete stool study and return. Call if symptoms worsen or if symptoms do not improve. You may use allegra as needed for itch and hydrocortisone as needed.

## 2014-02-12 ENCOUNTER — Other Ambulatory Visit: Payer: No Typology Code available for payment source

## 2014-02-12 ENCOUNTER — Ambulatory Visit: Payer: No Typology Code available for payment source | Admitting: Oncology

## 2014-02-22 ENCOUNTER — Other Ambulatory Visit (INDEPENDENT_AMBULATORY_CARE_PROVIDER_SITE_OTHER): Payer: PRIVATE HEALTH INSURANCE

## 2014-02-22 DIAGNOSIS — R195 Other fecal abnormalities: Secondary | ICD-10-CM

## 2014-02-22 LAB — FECAL OCCULT BLOOD, IMMUNOCHEMICAL: Fecal Occult Bld: NEGATIVE

## 2014-02-23 ENCOUNTER — Ambulatory Visit
Admission: RE | Admit: 2014-02-23 | Discharge: 2014-02-23 | Disposition: A | Discharge status: Home / Self Care | Payer: PRIVATE HEALTH INSURANCE | Source: Ambulatory Visit | Attending: Family | Admitting: Family

## 2014-02-23 ENCOUNTER — Encounter: Payer: Self-pay | Admitting: Family

## 2014-02-23 DIAGNOSIS — Z1231 Encounter for screening mammogram for malignant neoplasm of breast: Secondary | ICD-10-CM

## 2014-02-24 ENCOUNTER — Telehealth: Payer: Self-pay | Admitting: Family

## 2014-02-24 DIAGNOSIS — Z9882 Breast implant status: Secondary | ICD-10-CM

## 2014-02-24 NOTE — Telephone Encounter (Signed)
Caller name:Aisling  Relation to BO:FBPZ  Call back number: [phone] [place]   Reason for call: has another yeast infection has tried Monastat twice with no success please call in something  She needs a referral to Dr Hope Budds at [facility]  Because her implants are shifting badly  Her insurance requires a referral

## 2014-02-25 MED ORDER — FLUCONAZOLE 150 MG PO TABS: ORAL_TABLET | ORAL | Status: DC

## 2014-02-25 NOTE — Telephone Encounter (Signed)
Pt presented to office this AM at 9 am requesting diflucan and rx was sent.  Referral made.

## 2014-02-26 ENCOUNTER — Ambulatory Visit: Payer: No Typology Code available for payment source

## 2014-03-01 ENCOUNTER — Encounter: Payer: Self-pay | Admitting: Family

## 2014-03-09 ENCOUNTER — Encounter (HOSPITAL_BASED_OUTPATIENT_CLINIC_OR_DEPARTMENT_OTHER): Payer: Self-pay | Admitting: *Deleted

## 2014-03-09 ENCOUNTER — Encounter: Payer: Self-pay | Admitting: Family

## 2014-03-10 ENCOUNTER — Ambulatory Visit: Payer: 59 | Admitting: Family

## 2014-03-11 ENCOUNTER — Encounter (HOSPITAL_BASED_OUTPATIENT_CLINIC_OR_DEPARTMENT_OTHER): Payer: Self-pay | Admitting: Plastic Surgery

## 2014-03-11 ENCOUNTER — Ambulatory Visit (HOSPITAL_BASED_OUTPATIENT_CLINIC_OR_DEPARTMENT_OTHER): Payer: PRIVATE HEALTH INSURANCE | Admitting: Anesthesiology

## 2014-03-11 ENCOUNTER — Encounter: Payer: Self-pay | Admitting: Plastic Surgery

## 2014-03-11 ENCOUNTER — Other Ambulatory Visit: Payer: Self-pay | Admitting: Plastic Surgery

## 2014-03-11 ENCOUNTER — Ambulatory Visit: Payer: 59 | Admitting: Family

## 2014-03-11 ENCOUNTER — Ambulatory Visit (HOSPITAL_BASED_OUTPATIENT_CLINIC_OR_DEPARTMENT_OTHER)
Admission: RE | Admit: 2014-03-11 | Discharge: 2014-03-11 | Disposition: A | Discharge status: Home / Self Care | Payer: PRIVATE HEALTH INSURANCE | Source: Ambulatory Visit | Attending: Plastic Surgery | Admitting: Plastic Surgery

## 2014-03-11 ENCOUNTER — Encounter (HOSPITAL_BASED_OUTPATIENT_CLINIC_OR_DEPARTMENT_OTHER): Payer: Self-pay | Admitting: *Deleted

## 2014-03-11 ENCOUNTER — Encounter (HOSPITAL_BASED_OUTPATIENT_CLINIC_OR_DEPARTMENT_OTHER)
Admission: RE | Disposition: A | Discharge status: Home / Self Care | Payer: Self-pay | Source: Ambulatory Visit | Attending: Plastic Surgery

## 2014-03-11 DIAGNOSIS — T8544XA Capsular contracture of breast implant, initial encounter: Secondary | ICD-10-CM | POA: Insufficient documentation

## 2014-03-11 DIAGNOSIS — G629 Polyneuropathy, unspecified: Secondary | ICD-10-CM | POA: Insufficient documentation

## 2014-03-11 DIAGNOSIS — D649 Anemia, unspecified: Secondary | ICD-10-CM | POA: Insufficient documentation

## 2014-03-11 DIAGNOSIS — G43909 Migraine, unspecified, not intractable, without status migrainosus: Secondary | ICD-10-CM | POA: Diagnosis not present

## 2014-03-11 DIAGNOSIS — R011 Cardiac murmur, unspecified: Secondary | ICD-10-CM | POA: Diagnosis not present

## 2014-03-11 DIAGNOSIS — Z9884 Bariatric surgery status: Secondary | ICD-10-CM | POA: Insufficient documentation

## 2014-03-11 DIAGNOSIS — F419 Anxiety disorder, unspecified: Secondary | ICD-10-CM | POA: Diagnosis not present

## 2014-03-11 DIAGNOSIS — Z888 Allergy status to other drugs, medicaments and biological substances status: Secondary | ICD-10-CM | POA: Diagnosis not present

## 2014-03-11 DIAGNOSIS — E739 Lactose intolerance, unspecified: Secondary | ICD-10-CM | POA: Insufficient documentation

## 2014-03-11 DIAGNOSIS — G4700 Insomnia, unspecified: Secondary | ICD-10-CM | POA: Insufficient documentation

## 2014-03-11 DIAGNOSIS — I10 Essential (primary) hypertension: Secondary | ICD-10-CM | POA: Insufficient documentation

## 2014-03-11 DIAGNOSIS — M13861 Other specified arthritis, right knee: Secondary | ICD-10-CM | POA: Insufficient documentation

## 2014-03-11 DIAGNOSIS — N6489 Other specified disorders of breast: Secondary | ICD-10-CM | POA: Diagnosis not present

## 2014-03-11 DIAGNOSIS — M549 Dorsalgia, unspecified: Secondary | ICD-10-CM | POA: Diagnosis not present

## 2014-03-11 DIAGNOSIS — Y813 Surgical instruments, materials and general- and plastic-surgery devices (including sutures) associated with adverse incidents: Secondary | ICD-10-CM | POA: Diagnosis not present

## 2014-03-11 DIAGNOSIS — G8929 Other chronic pain: Secondary | ICD-10-CM | POA: Diagnosis not present

## 2014-03-11 DIAGNOSIS — Z9012 Acquired absence of left breast and nipple: Secondary | ICD-10-CM

## 2014-03-11 DIAGNOSIS — Z90710 Acquired absence of both cervix and uterus: Secondary | ICD-10-CM | POA: Insufficient documentation

## 2014-03-11 DIAGNOSIS — Z91030 Bee allergy status: Secondary | ICD-10-CM | POA: Insufficient documentation

## 2014-03-11 DIAGNOSIS — Z853 Personal history of malignant neoplasm of breast: Secondary | ICD-10-CM | POA: Insufficient documentation

## 2014-03-11 DIAGNOSIS — K219 Gastro-esophageal reflux disease without esophagitis: Secondary | ICD-10-CM | POA: Insufficient documentation

## 2014-03-11 DIAGNOSIS — M13862 Other specified arthritis, left knee: Secondary | ICD-10-CM | POA: Diagnosis not present

## 2014-03-11 HISTORY — PX: CAPSULECTOMY: SHX5381

## 2014-03-11 LAB — POCT HEMOGLOBIN-HEMACUE: Hemoglobin: 12.6 g/dL (ref 12.0–15.0)

## 2014-03-11 SURGERY — CAPSULECTOMY, BREAST
Anesthesia: General | Site: Breast | Laterality: Left

## 2014-03-11 MED ORDER — CEFAZOLIN SODIUM-DEXTROSE 2-3 GM-% IV SOLR
2.0000 g | INTRAVENOUS | Status: AC
  Administered 2014-03-11: 2 g via INTRAVENOUS

## 2014-03-11 MED ORDER — MIDAZOLAM HCL 2 MG/2ML IJ SOLN
INTRAMUSCULAR | Status: AC
  Filled 2014-03-11: qty 2

## 2014-03-11 MED ORDER — DIAZEPAM 2 MG PO TABS: 2.0000 mg | ORAL_TABLET | Freq: Four times a day (QID) | ORAL | Status: DC | PRN

## 2014-03-11 MED ORDER — OXYCODONE HCL 5 MG PO TABS
ORAL_TABLET | ORAL | Status: AC
  Filled 2014-03-11: qty 1

## 2014-03-11 MED ORDER — OXYCODONE HCL 5 MG/5ML PO SOLN: 5.0000 mg | Freq: Once | ORAL | Status: AC | PRN

## 2014-03-11 MED ORDER — HYDROMORPHONE HCL 1 MG/ML IJ SOLN
INTRAMUSCULAR | Status: AC
  Filled 2014-03-11: qty 1

## 2014-03-11 MED ORDER — MIDAZOLAM HCL 2 MG/2ML IJ SOLN
1.0000 mg | INTRAMUSCULAR | Status: DC | PRN
Start: 2014-03-11 — End: 2014-03-11

## 2014-03-11 MED ORDER — ONDANSETRON HCL 4 MG/2ML IJ SOLN: 4.0000 mg | Freq: Four times a day (QID) | INTRAMUSCULAR | Status: DC | PRN

## 2014-03-11 MED ORDER — ONDANSETRON HCL 4 MG/2ML IJ SOLN
INTRAMUSCULAR | Status: DC | PRN
Start: 2014-03-11 — End: 2014-03-11
  Administered 2014-03-11: 4 mg via INTRAVENOUS

## 2014-03-11 MED ORDER — OXYCODONE HCL 5 MG PO TABS
5.0000 mg | ORAL_TABLET | Freq: Once | ORAL | Status: AC | PRN
  Administered 2014-03-11: 5 mg via ORAL

## 2014-03-11 MED ORDER — FENTANYL CITRATE 0.05 MG/ML IJ SOLN
INTRAMUSCULAR | Status: AC
  Filled 2014-03-11: qty 6

## 2014-03-11 MED ORDER — DEXAMETHASONE SODIUM PHOSPHATE 4 MG/ML IJ SOLN
INTRAMUSCULAR | Status: DC | PRN
  Administered 2014-03-11: 10 mg via INTRAVENOUS

## 2014-03-11 MED ORDER — HYDROCODONE-ACETAMINOPHEN 5-325 MG PO TABS: 1.0000 | ORAL_TABLET | Freq: Four times a day (QID) | ORAL | Status: DC | PRN

## 2014-03-11 MED ORDER — HYDROMORPHONE HCL 1 MG/ML IJ SOLN
0.2500 mg | INTRAMUSCULAR | Status: DC | PRN
  Administered 2014-03-11 (×3): 0.5 mg via INTRAVENOUS

## 2014-03-11 MED ORDER — SODIUM CHLORIDE 0.9 % IR SOLN
Status: DC | PRN
  Administered 2014-03-11: 500 mL

## 2014-03-11 MED ORDER — BUPIVACAINE-EPINEPHRINE 0.25% -1:200000 IJ SOLN
INTRAMUSCULAR | Status: DC | PRN
  Administered 2014-03-11: 4 mL

## 2014-03-11 MED ORDER — FENTANYL CITRATE 0.05 MG/ML IJ SOLN: 50.0000 ug | INTRAMUSCULAR | Status: DC | PRN

## 2014-03-11 MED ORDER — CEFAZOLIN SODIUM-DEXTROSE 2-3 GM-% IV SOLR
INTRAVENOUS | Status: AC
  Filled 2014-03-11: qty 50

## 2014-03-11 MED ORDER — PROPOFOL 10 MG/ML IV BOLUS
INTRAVENOUS | Status: DC | PRN
  Administered 2014-03-11: 200 mg via INTRAVENOUS

## 2014-03-11 MED ORDER — LIDOCAINE HCL (CARDIAC) 20 MG/ML IV SOLN
INTRAVENOUS | Status: DC | PRN
  Administered 2014-03-11: 50 mg via INTRAVENOUS

## 2014-03-11 MED ORDER — FENTANYL CITRATE 0.05 MG/ML IJ SOLN
INTRAMUSCULAR | Status: DC | PRN
  Administered 2014-03-11: 100 ug via INTRAVENOUS

## 2014-03-11 MED ORDER — BUPIVACAINE-EPINEPHRINE (PF) 0.25% -1:200000 IJ SOLN
INTRAMUSCULAR | Status: AC
  Filled 2014-03-11: qty 30

## 2014-03-11 MED ORDER — LACTATED RINGERS IV SOLN
INTRAVENOUS | Status: DC
  Administered 2014-03-11: 12:00:00 via INTRAVENOUS

## 2014-03-11 MED ORDER — MIDAZOLAM HCL 5 MG/5ML IJ SOLN
INTRAMUSCULAR | Status: DC | PRN
  Administered 2014-03-11: 2 mg via INTRAVENOUS

## 2014-03-11 MED ORDER — LIDOCAINE-EPINEPHRINE 1 %-1:100000 IJ SOLN
INTRAMUSCULAR | Status: AC
  Filled 2014-03-11: qty 1

## 2014-03-11 SURGICAL SUPPLY — 57 items
BAG DECANTER FOR FLEXI CONT (MISCELLANEOUS) ×3 IMPLANT
BINDER BREAST LRG (GAUZE/BANDAGES/DRESSINGS) IMPLANT
BINDER BREAST MEDIUM (GAUZE/BANDAGES/DRESSINGS) IMPLANT
BINDER BREAST XLRG (GAUZE/BANDAGES/DRESSINGS) IMPLANT
BINDER BREAST XXLRG (GAUZE/BANDAGES/DRESSINGS) IMPLANT
BIOPATCH RED 1 DISK 7.0 (GAUZE/BANDAGES/DRESSINGS) IMPLANT
BLADE HEX COATED 2.75 (ELECTRODE) ×3 IMPLANT
BLADE SURG 15 STRL LF DISP TIS (BLADE) ×2 IMPLANT
BLADE SURG 15 STRL SS (BLADE) ×1
BNDG GAUZE ELAST 4 BULKY (GAUZE/BANDAGES/DRESSINGS) ×6 IMPLANT
CANISTER SUCT 1200ML W/VALVE (MISCELLANEOUS) ×3 IMPLANT
CHLORAPREP W/TINT 26ML (MISCELLANEOUS) ×3 IMPLANT
CORDS BIPOLAR (ELECTRODE) IMPLANT
COVER BACK TABLE 60X90IN (DRAPES) ×3 IMPLANT
COVER MAYO STAND STRL (DRAPES) ×3 IMPLANT
DECANTER SPIKE VIAL GLASS SM (MISCELLANEOUS) IMPLANT
DRAIN CHANNEL 19F RND (DRAIN) IMPLANT
DRAPE LAPAROSCOPIC ABDOMINAL (DRAPES) ×3 IMPLANT
DRSG PAD ABDOMINAL 8X10 ST (GAUZE/BANDAGES/DRESSINGS) ×6 IMPLANT
ELECT BLADE 4.0 EZ CLEAN MEGAD (MISCELLANEOUS) ×3
ELECT BLADE 6.5 .24CM SHAFT (ELECTRODE) IMPLANT
ELECT REM PT RETURN 9FT ADLT (ELECTROSURGICAL) ×3
ELECTRODE BLDE 4.0 EZ CLN MEGD (MISCELLANEOUS) ×2 IMPLANT
ELECTRODE REM PT RTRN 9FT ADLT (ELECTROSURGICAL) ×2 IMPLANT
EVACUATOR SILICONE 100CC (DRAIN) IMPLANT
GLOVE BIO SURGEON STRL SZ 6.5 (GLOVE) ×6 IMPLANT
GLOVE BIOGEL PI IND STRL 7.0 (GLOVE) ×2 IMPLANT
GLOVE BIOGEL PI INDICATOR 7.0 (GLOVE) ×1
GLOVE ECLIPSE 6.5 STRL STRAW (GLOVE) ×3 IMPLANT
GOWN STRL REUS W/ TWL LRG LVL3 (GOWN DISPOSABLE) ×4 IMPLANT
GOWN STRL REUS W/TWL LRG LVL3 (GOWN DISPOSABLE) ×2
IV NS 1000ML (IV SOLUTION)
IV NS 1000ML BAXH (IV SOLUTION) IMPLANT
LIQUID BAND (GAUZE/BANDAGES/DRESSINGS) ×3 IMPLANT
NEEDLE HYPO 25X1 1.5 SAFETY (NEEDLE) ×3 IMPLANT
PACK BASIN DAY SURGERY FS (CUSTOM PROCEDURE TRAY) ×3 IMPLANT
PENCIL BUTTON HOLSTER BLD 10FT (ELECTRODE) ×3 IMPLANT
PIN SAFETY STERILE (MISCELLANEOUS) IMPLANT
SLEEVE SCD COMPRESS KNEE MED (MISCELLANEOUS) ×3 IMPLANT
SPONGE GAUZE 4X4 12PLY STER LF (GAUZE/BANDAGES/DRESSINGS) IMPLANT
SPONGE LAP 18X18 X RAY DECT (DISPOSABLE) ×6 IMPLANT
SUT MNCRL AB 4-0 PS2 18 (SUTURE) ×6 IMPLANT
SUT MON AB 3-0 SH 27 (SUTURE) ×2
SUT MON AB 3-0 SH27 (SUTURE) ×4 IMPLANT
SUT MON AB 5-0 PS2 18 (SUTURE) ×6 IMPLANT
SUT PDS 3-0 CT2 (SUTURE) ×9
SUT PDS AB 2-0 CT2 27 (SUTURE) IMPLANT
SUT PDS II 3-0 CT2 27 ABS (SUTURE) ×6 IMPLANT
SUT VIC AB 3-0 SH 27 (SUTURE) ×1
SUT VIC AB 3-0 SH 27X BRD (SUTURE) ×2 IMPLANT
SUT VICRYL 4-0 PS2 18IN ABS (SUTURE) IMPLANT
SYR BULB IRRIGATION 50ML (SYRINGE) ×3 IMPLANT
SYR CONTROL 10ML LL (SYRINGE) ×3 IMPLANT
TOWEL OR 17X24 6PK STRL BLUE (TOWEL DISPOSABLE) ×6 IMPLANT
TUBE CONNECTING 20X1/4 (TUBING) ×3 IMPLANT
UNDERPAD 30X30 INCONTINENT (UNDERPADS AND DIAPERS) ×6 IMPLANT
YANKAUER SUCT BULB TIP NO VENT (SUCTIONS) ×3 IMPLANT

## 2014-03-11 NOTE — Transfer of Care (Signed)
Immediate Anesthesia Transfer of Care Note  Patient: Tammie [name]  Procedure(s) Performed: Procedure(s): CAPSULECTOMY WITH REPOSITIONING OF IMPLANT (Left)  Patient Location: PACU  Anesthesia Type:General  Level of Consciousness: awake and sedated  Airway & Oxygen Therapy: Patient Spontanous Breathing and Patient connected to face mask oxygen  Post-op Assessment: Report given to PACU RN and Post -op Vital signs reviewed and stable  Post vital signs: Reviewed and stable  Complications: No apparent anesthesia complications

## 2014-03-11 NOTE — Brief Op Note (Signed)
03/11/2014  1:29 PM  PATIENT:  Collier Bullock  43 y.o. female  PRE-OPERATIVE DIAGNOSIS:  personal history of breast cancer/malposition of left breast implant  POST-OPERATIVE DIAGNOSIS:  personal history of breast cancer/malposition of left breast implant  PROCEDURE:  Procedure(s): CAPSULECTOMY WITH REPOSITIONING OF IMPLANT (Left)  SURGEON:  Surgeon(s) and Role:    * Claire Sanger, DO - Primary  PHYSICIAN ASSISTANT: Shawn Rayburn, PA  ASSISTANTS: none   ANESTHESIA:   general  EBL:  Total I/O In: 1300 [I.V.:1300] Out: -   BLOOD ADMINISTERED:none  DRAINS: none   LOCAL MEDICATIONS USED:  MARCAINE     SPECIMEN:  No Specimen  DISPOSITION OF SPECIMEN:  N/A  COUNTS:  YES  TOURNIQUET:  * No tourniquets in log *  DICTATION: .Dragon Dictation  PLAN OF CARE: Discharge to home after PACU  PATIENT DISPOSITION:  PACU - hemodynamically stable.   Delay start of Pharmacological VTE agent (>24hrs) due to surgical blood loss or risk of bleeding: no

## 2014-03-11 NOTE — Anesthesia Preprocedure Evaluation (Signed)
Anesthesia Evaluation  Patient identified by MRN, date of birth, ID band Patient awake    Reviewed: Allergy & Precautions, H&P , NPO status , Patient's Chart, lab work & pertinent test results  Airway Mallampati: II   Neck ROM: full    Dental   Pulmonary          Cardiovascular hypertension,     Neuro/Psych  Headaches, Anxiety Depression  Neuromuscular disease    GI/Hepatic GERD-  ,  Endo/Other    Renal/GU      Musculoskeletal  (+) Arthritis -,   Abdominal   Peds  Hematology  (+) anemia ,   Anesthesia Other Findings   Reproductive/Obstetrics                             Anesthesia Physical Anesthesia Plan  ASA: II  Anesthesia Plan: General   Post-op Pain Management:    Induction: Intravenous  Airway Management Planned: LMA  Additional Equipment:   Intra-op Plan:   Post-operative Plan:   Informed Consent: I have reviewed the patients History and Physical, chart, labs and discussed the procedure including the risks, benefits and alternatives for the proposed anesthesia with the patient or authorized representative who has indicated his/her understanding and acceptance.     Plan Discussed with: CRNA, Anesthesiologist and Surgeon  Anesthesia Plan Comments:         Anesthesia Quick Evaluation

## 2014-03-11 NOTE — Anesthesia Procedure Notes (Signed)
Procedure Name: LMA Insertion Date/Time: 03/11/2014 12:33 PM Performed by: Melynda Ripple D Pre-anesthesia Checklist: Patient identified, Emergency Drugs available, Suction available and Patient being monitored Patient Re-evaluated:Patient Re-evaluated prior to inductionOxygen Delivery Method: Circle System Utilized Preoxygenation: Pre-oxygenation with 100% oxygen Intubation Type: IV induction Ventilation: Mask ventilation without difficulty LMA: LMA inserted LMA Size: 4.0 Number of attempts: 1 Airway Equipment and Method: bite block Placement Confirmation: positive ETCO2 Tube secured with: Tape Dental Injury: Teeth and Oropharynx as per pre-operative assessment
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
## 2014-03-11 NOTE — Op Note (Signed)
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  SURGEON: Theodoro Kos, DO  ASSISTANT: Shawn Rayburn, PA  ANESTHESIA:  General.   COMPLICATIONS: None.   INDICATIONS FOR PROCEDURE:  The patient, Tammie [name] is a 43 y.o. female born on 10/06/1970, is here for treatment of left breast asymmetry, malposition and capsule contracture. MRN: [id number]  CONSENT:  Informed consent was obtained directly from the patient. Risks, benefits and alternatives were fully discussed. Specific risks including but not limited to bleeding, infection, hematoma, seroma, scarring, pain, infection, contracture, asymmetry, wound healing problems, and need for further surgery were all discussed. The patient did have an ample opportunity to have questions answered to satisfaction.   DESCRIPTION OF PROCEDURE:  The patient was taken to the operating room. SCDs were placed and IV antibiotics were given. The patient's operative site was prepped and draped in a sterile fashion. A time out was performed and all information was confirmed to be correct.  General anesthesia was administered.  The lateral inframammary fold was injected with local and the scar excised.  The bovie was used to dissect to the implant from the lateral aspect.  The implant was removed and placed in antibiotic solution.  The inferior and lateral capsule was excised.  The 3-0 PDS was used to reposition the capsule more superior and medial.  The implant was placed back in the pocket after it was irrigated with antibiotic solution.  The deep layers were closed with 3-0 Monocryl followed by 4-0 and 5-0 Monocryl.  The patient tolerated the procedure  well.  There were no complications. The patient was allowed to wake from anesthesia, extubated and taken to the recovery room in satisfactory condition.

## 2014-03-11 NOTE — Interval H&P Note (Signed)
History and Physical Interval Note:  03/11/2014 11:49 AM  Tammie [name]  has presented today for surgery, with the diagnosis of personal history of breast cancer/malposition of left breast implant  The various methods of treatment have been discussed with the patient and family. After consideration of risks, benefits and other options for treatment, the patient has consented to  Procedure(s): LEFT BREAST CAPSULOTOMY WITH REPOSITIONING OF IMPLANT AND POSSIBLE FLEX HD (Left) as a surgical intervention .  The patient's history has been reviewed, patient examined, no change in status, stable for surgery.  I have reviewed the patient's chart and labs.  Questions were answered to the patient's satisfaction.     SANGER,CLAIRE

## 2014-03-11 NOTE — Discharge Instructions (Signed)
May shower Saturday Continue sports bra or binder   Post Anesthesia Home Care Instructions  Activity: Get plenty of rest for the remainder of the day. A responsible adult should stay with you for 24 hours following the procedure.  For the next 24 hours, DO NOT: -Drive a car -Paediatric nurse -Drink alcoholic beverages -Take any medication unless instructed by your physician -Make any legal decisions or sign important papers.  Meals: Start with liquid foods such as gelatin or soup. Progress to regular foods as tolerated. Avoid greasy, spicy, heavy foods. If nausea and/or vomiting occur, drink only clear liquids until the nausea and/or vomiting subsides. Call your physician if vomiting continues.  Special Instructions/Symptoms: Your throat may feel dry or sore from the anesthesia or the breathing tube placed in your throat during surgery. If this causes discomfort, gargle with warm salt water. The discomfort should disappear within 24 hours.

## 2014-03-11 NOTE — Anesthesia Postprocedure Evaluation (Signed)
  Anesthesia Post-op Note  Patient: Tammie [name]  Procedure(s) Performed: Procedure(s): CAPSULECTOMY WITH REPOSITIONING OF IMPLANT (Left)  Patient Location: PACU  Anesthesia Type: General   Level of Consciousness: awake, alert  and oriented  Airway and Oxygen Therapy: Patient Spontanous Breathing  Post-op Pain: mild  Post-op Assessment: Post-op Vital signs reviewed  Post-op Vital Signs: Reviewed  Last Vitals:  Filed Vitals:   03/11/14 1515  BP: 158/87  Pulse: 67  Temp: 36.6 C  Resp: 16    Complications: No apparent anesthesia complications

## 2014-03-12 ENCOUNTER — Encounter (HOSPITAL_BASED_OUTPATIENT_CLINIC_OR_DEPARTMENT_OTHER): Payer: Self-pay | Admitting: Plastic Surgery

## 2014-03-31 ENCOUNTER — Other Ambulatory Visit (HOSPITAL_COMMUNITY)
Admission: RE | Admit: 2014-03-31 | Discharge: 2014-03-31 | Disposition: A | Discharge status: Home / Self Care | Payer: PRIVATE HEALTH INSURANCE | Source: Ambulatory Visit | Attending: Family | Admitting: Family

## 2014-03-31 ENCOUNTER — Ambulatory Visit (INDEPENDENT_AMBULATORY_CARE_PROVIDER_SITE_OTHER): Payer: PRIVATE HEALTH INSURANCE | Admitting: Family

## 2014-03-31 ENCOUNTER — Encounter: Payer: Self-pay | Admitting: Family

## 2014-03-31 VITALS — BP 110/80 | HR 78 | Temp 97.7°F | Resp 16 | Ht 67.0 in | Wt 172.4 lb

## 2014-03-31 DIAGNOSIS — N760 Acute vaginitis: Secondary | ICD-10-CM | POA: Diagnosis present

## 2014-03-31 DIAGNOSIS — J029 Acute pharyngitis, unspecified: Secondary | ICD-10-CM | POA: Insufficient documentation

## 2014-03-31 NOTE — Addendum Note (Signed)
Addended by: Kelle Darting A on: 03/31/2014 08:01 AM   Modules accepted: Orders

## 2014-03-31 NOTE — Patient Instructions (Signed)
You may use ibuprofen as needed for sore throat. Call if symptoms worsen or if symptoms do not improve in the next 3 days.  We will contact you with your results.

## 2014-03-31 NOTE — Progress Notes (Signed)
Pre visit review using our clinic review tool, if applicable. No additional management support is needed unless otherwise documented below in the visit note. 
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
## 2014-03-31 NOTE — Addendum Note (Signed)
Addended by: Harl Bowie on: 03/31/2014 08:10 AM   Modules accepted: Orders

## 2014-03-31 NOTE — Assessment & Plan Note (Signed)
Rapid strep is negative. Will send confirmatory testing.  Recommend ibuprofen as needed.

## 2014-03-31 NOTE — Assessment & Plan Note (Signed)
Wet prep obtained today. Plan to treat pending review of results.

## 2014-04-01 ENCOUNTER — Encounter: Payer: Self-pay | Admitting: Family

## 2014-04-01 LAB — CERVICOVAGINAL ANCILLARY ONLY
WET PREP (BD AFFIRM): NEGATIVE
WET PREP (BD AFFIRM): NEGATIVE
Wet Prep (BD Affirm): NEGATIVE

## 2014-04-01 LAB — STREP A DNA PROBE: GASP: NEGATIVE

## 2014-04-06 ENCOUNTER — Telehealth: Payer: Self-pay | Admitting: Family

## 2014-04-06 NOTE — Telephone Encounter (Signed)
Caller name:[place], [place] Relation to JO:INOM Call back [place]:  Reason for call: pt is wanting results from labs that were done on last Wednesday, states Melissa informed her that she would call her with the results because her my chart is not active at this time. Ok to leave detailed message if no answer.

## 2014-04-06 NOTE — Telephone Encounter (Signed)
The vaginal swab was negative for yeast and bacteria. How are your vaginal symptoms? Strep is negative.

## 2014-04-06 NOTE — Telephone Encounter (Signed)
Please advise 

## 2014-04-07 NOTE — Telephone Encounter (Signed)
LMOM for Pt informing her of results. Instructed her to give call if she is still having any symptoms.

## 2014-04-08 NOTE — Telephone Encounter (Signed)
Patient returned phone call. Read patient exactly what Melissa states. She states that she is still having vaginal discharge.

## 2014-04-08 NOTE — Telephone Encounter (Signed)
Please contact pt and advise her to arrange a follow up visit. I would like to repeat wet prep and also send a GC/Chlamydia.  Previous wet prep negative but I did not do GC/Chlamydia.

## 2014-04-08 NOTE — Telephone Encounter (Signed)
Pt returned call, stating she is still having vaginal discharge.

## 2014-04-09 NOTE — Telephone Encounter (Signed)
Left message for pt to return my call.

## 2014-04-13 ENCOUNTER — Telehealth: Payer: Self-pay | Admitting: Family

## 2014-04-13 DIAGNOSIS — M25552 Pain in left hip: Secondary | ICD-10-CM

## 2014-04-13 NOTE — Telephone Encounter (Signed)
Pt requesting a referral for osteoporosis specialist

## 2014-04-14 NOTE — Addendum Note (Signed)
Addended by: Debbrah Alar on: 04/14/2014 04:23 PM   Modules accepted: Orders

## 2014-04-14 NOTE — Telephone Encounter (Addendum)
Spoke with pt. She states she was requesting orthopedic referral due to her left hip pain and history of chemo. Previously saw ortho for arthritis in her knee but doesn't remember doctor's name (located [address]). Pt wants to see them before the end of the year due to having met her deductible.  Pt also states PCP previously wanted her to have an abdominal scan (r/o kidney stone?) but she could not afford it at the time. Would like to proceed with that before the end of the year as well.  Please advise.

## 2014-04-14 NOTE — Telephone Encounter (Signed)
Notified pt and scheduled f/u for 04/19/14 at 3:45pm.

## 2014-04-19 ENCOUNTER — Encounter: Payer: Self-pay | Admitting: Family

## 2014-04-19 ENCOUNTER — Ambulatory Visit (INDEPENDENT_AMBULATORY_CARE_PROVIDER_SITE_OTHER): Payer: PRIVATE HEALTH INSURANCE | Admitting: Family

## 2014-04-19 ENCOUNTER — Other Ambulatory Visit (HOSPITAL_COMMUNITY)
Admission: RE | Admit: 2014-04-19 | Discharge: 2014-04-19 | Disposition: A | Discharge status: Home / Self Care | Payer: PRIVATE HEALTH INSURANCE | Source: Ambulatory Visit | Attending: Family | Admitting: Family

## 2014-04-19 VITALS — BP 140/80 | HR 60 | Temp 97.6°F | Resp 16 | Ht 67.0 in | Wt 173.2 lb

## 2014-04-19 DIAGNOSIS — N760 Acute vaginitis: Secondary | ICD-10-CM

## 2014-04-19 NOTE — Patient Instructions (Signed)
Try using Replens as needed for vaginal dryness. We will contact you with your results.
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
## 2014-04-19 NOTE — Assessment & Plan Note (Signed)
Will repeat Wet prep along with check GC/Chlamydia. Suspect symptoms may be secondary to atrophic vaginitis in the setting of tamoxifen use. We discussed use of replens vaginal lubricant.

## 2014-04-19 NOTE — Progress Notes (Signed)
Pre visit review using our clinic review tool, if applicable. No additional management support is needed unless otherwise documented below in the visit note. 

## 2014-04-19 NOTE — Addendum Note (Signed)
Addended by: Kelle Darting A on: 04/19/2014 06:05 PM   Modules accepted: Orders

## 2014-04-21 ENCOUNTER — Encounter: Payer: Self-pay | Admitting: Family

## 2014-04-21 LAB — GC/CHLAMYDIA PROBE AMP
CT PROBE, AMP APTIMA: NEGATIVE
GC PROBE AMP APTIMA: NEGATIVE

## 2014-04-21 LAB — CERVICOVAGINAL ANCILLARY ONLY
WET PREP (BD AFFIRM): NEGATIVE
WET PREP (BD AFFIRM): NEGATIVE
Wet Prep (BD Affirm): NEGATIVE

## 2014-04-23 ENCOUNTER — Other Ambulatory Visit: Payer: Self-pay | Admitting: Family

## 2014-04-30 DIAGNOSIS — I10 Essential (primary) hypertension: Secondary | ICD-10-CM

## 2014-04-30 HISTORY — DX: Essential (primary) hypertension: I10

## 2014-05-10 ENCOUNTER — Encounter: Payer: Self-pay | Admitting: Family

## 2014-05-10 ENCOUNTER — Ambulatory Visit (INDEPENDENT_AMBULATORY_CARE_PROVIDER_SITE_OTHER): Payer: 59 | Admitting: Family

## 2014-05-10 VITALS — HR 77 | Temp 97.8°F | Resp 16 | Ht 67.0 in | Wt 173.2 lb

## 2014-05-10 DIAGNOSIS — J309 Allergic rhinitis, unspecified: Secondary | ICD-10-CM | POA: Insufficient documentation

## 2014-05-10 DIAGNOSIS — F32A Depression, unspecified: Secondary | ICD-10-CM

## 2014-05-10 DIAGNOSIS — F329 Major depressive disorder, single episode, unspecified: Secondary | ICD-10-CM

## 2014-05-10 DIAGNOSIS — D509 Iron deficiency anemia, unspecified: Secondary | ICD-10-CM

## 2014-05-10 DIAGNOSIS — K219 Gastro-esophageal reflux disease without esophagitis: Secondary | ICD-10-CM

## 2014-05-10 DIAGNOSIS — R739 Hyperglycemia, unspecified: Secondary | ICD-10-CM

## 2014-05-10 DIAGNOSIS — E559 Vitamin D deficiency, unspecified: Secondary | ICD-10-CM | POA: Insufficient documentation

## 2014-05-10 LAB — HEMOGLOBIN A1C: Hgb A1c MFr Bld: 5.6 % (ref 4.6–6.5)

## 2014-05-10 LAB — VITAMIN D 25 HYDROXY (VIT D DEFICIENCY, FRACTURES): VITD: 24.87 ng/mL — ABNORMAL LOW (ref 30.00–100.00)

## 2014-05-10 MED ORDER — OMEPRAZOLE 20 MG PO CPDR: 20.0000 mg | DELAYED_RELEASE_CAPSULE | Freq: Every day | ORAL | Status: DC

## 2014-05-10 MED ORDER — VENLAFAXINE HCL ER 150 MG PO CP24: ORAL_CAPSULE | ORAL | Status: DC

## 2014-05-10 MED ORDER — MONTELUKAST SODIUM 10 MG PO TABS: 10.0000 mg | ORAL_TABLET | Freq: Every day | ORAL | Status: DC

## 2014-05-10 MED ORDER — CITALOPRAM HYDROBROMIDE 20 MG PO TABS: 20.0000 mg | ORAL_TABLET | Freq: Every day | ORAL | Status: DC

## 2014-05-10 NOTE — Assessment & Plan Note (Signed)
Continue vit D supplement, obtain level.

## 2014-05-10 NOTE — Assessment & Plan Note (Signed)
Last hgb normal- monitor.

## 2014-05-10 NOTE — Progress Notes (Signed)
Pre visit review using our clinic review tool, if applicable. No additional management support is needed unless otherwise documented below in the visit note. 

## 2014-05-10 NOTE — Patient Instructions (Signed)
Please complete lab work prior to leaving. Follow up in 6 months, sooner if problems or concerns.

## 2014-05-10 NOTE — Assessment & Plan Note (Signed)
Stable on current meds.  Continue same. 

## 2014-05-10 NOTE — Assessment & Plan Note (Signed)
Stable on current meds, continue same.  

## 2014-05-10 NOTE — Assessment & Plan Note (Signed)
Obtain follow up a1c.  
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
## 2014-05-11 ENCOUNTER — Encounter: Payer: Self-pay | Admitting: Family

## 2014-05-18 ENCOUNTER — Telehealth: Payer: Self-pay | Admitting: Hematology & Oncology

## 2014-05-18 NOTE — Telephone Encounter (Signed)
Patient called and cx 05/20/14 apt and stated she did not want to resch

## 2014-05-20 ENCOUNTER — Other Ambulatory Visit: Payer: 59 | Admitting: Lab

## 2014-05-20 ENCOUNTER — Ambulatory Visit: Payer: PRIVATE HEALTH INSURANCE | Admitting: Family

## 2014-05-25 NOTE — Telephone Encounter (Signed)
Please see unread message attached.

## 2014-05-28 ENCOUNTER — Encounter: Payer: Self-pay | Admitting: Family

## 2014-05-28 DIAGNOSIS — E559 Vitamin D deficiency, unspecified: Secondary | ICD-10-CM

## 2014-05-28 NOTE — Addendum Note (Signed)
Addended by: Kelle Darting A on: 05/28/2014 04:55 PM   Modules accepted: Medications

## 2014-05-28 NOTE — Telephone Encounter (Signed)
Please increase vit d to 50000 units twice weekly, repeat vit D level in 8 weeks, dx vit D deficiency.

## 2014-05-28 NOTE — Telephone Encounter (Signed)
Letter mailed to pt.  

## 2014-09-10 ENCOUNTER — Ambulatory Visit (INDEPENDENT_AMBULATORY_CARE_PROVIDER_SITE_OTHER): Payer: 59 | Admitting: Family

## 2014-09-10 ENCOUNTER — Encounter: Payer: Self-pay | Admitting: Family

## 2014-09-10 ENCOUNTER — Telehealth: Payer: Self-pay | Admitting: *Deleted

## 2014-09-10 VITALS — BP 110/60 | HR 65 | Temp 98.1°F | Resp 16 | Ht 67.0 in | Wt 181.0 lb

## 2014-09-10 DIAGNOSIS — R1032 Left lower quadrant pain: Secondary | ICD-10-CM | POA: Diagnosis not present

## 2014-09-10 DIAGNOSIS — Z0000 Encounter for general adult medical examination without abnormal findings: Secondary | ICD-10-CM

## 2014-09-10 DIAGNOSIS — Z7189 Other specified counseling: Secondary | ICD-10-CM | POA: Diagnosis not present

## 2014-09-10 DIAGNOSIS — Z7185 Encounter for immunization safety counseling: Secondary | ICD-10-CM

## 2014-09-10 MED ORDER — MONTELUKAST SODIUM 10 MG PO TABS: 10.0000 mg | ORAL_TABLET | Freq: Every day | ORAL | Status: DC

## 2014-09-10 MED ORDER — DIAZEPAM 2 MG PO TABS: 2.0000 mg | ORAL_TABLET | Freq: Four times a day (QID) | ORAL | Status: DC | PRN

## 2014-09-10 MED ORDER — ERGOCALCIFEROL 1.25 MG (50000 UT) PO CAPS: 50000.0000 [IU] | ORAL_CAPSULE | ORAL | Status: DC

## 2014-09-10 MED ORDER — AZELASTINE HCL 0.05 % OP SOLN: 1.0000 [drp] | Freq: Two times a day (BID) | OPHTHALMIC | Status: DC

## 2014-09-10 MED ORDER — AZELASTINE-FLUTICASONE 137-50 MCG/ACT NA SUSP: 1.0000 | Freq: Every day | NASAL | Status: DC

## 2014-09-10 NOTE — Patient Instructions (Signed)
Please complete your lab work prior to leaving. We will call you re: Hep A and B recommendations. Contact the Health Department for additional vaccines for [place] (Typhoid and Japanese Encephalitis). You will be contacted about your CT scan.

## 2014-09-10 NOTE — Telephone Encounter (Signed)
Pt requested handwritten Rx for vitamin D. Rx was already sent to pharmacy. Cancelled rx per Judieth Keens at [facility]. Rx printed and given to pt along with diazepam Rx.

## 2014-09-10 NOTE — Progress Notes (Signed)
Pre visit review using our clinic review tool, if applicable. No additional management support is needed unless otherwise documented below in the visit note. 
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
## 2014-09-11 LAB — HEPATITIS B SURFACE ANTIBODY,QUALITATIVE: HEP B S AB: POSITIVE — AB

## 2014-09-12 ENCOUNTER — Telehealth: Payer: Self-pay | Admitting: Family

## 2014-09-12 DIAGNOSIS — R1032 Left lower quadrant pain: Secondary | ICD-10-CM | POA: Insufficient documentation

## 2014-09-12 DIAGNOSIS — Z7185 Encounter for immunization safety counseling: Secondary | ICD-10-CM | POA: Insufficient documentation

## 2014-09-12 DIAGNOSIS — Z7189 Other specified counseling: Secondary | ICD-10-CM | POA: Insufficient documentation

## 2014-09-12 NOTE — Assessment & Plan Note (Signed)
Will obtain CT abd/pelvis for further evaluation.

## 2014-09-12 NOTE — Telephone Encounter (Signed)
Hep B testing shows immunity to Hep B.  I would recommend that she begin Hep A series.  Please schedule her for first Havrix vaccine. I will look up accelerated schedule to see when we can give her a second vaccine.

## 2014-09-12 NOTE — Assessment & Plan Note (Signed)
Hep B antibody is performed and shows immunity. Will initiate Hep A series (see phone note 5/15).  Also, I have recommended that she go to the health department to complete other recommended vaccines that we do not have available in our clinic.

## 2014-09-13 ENCOUNTER — Telehealth: Payer: Self-pay | Admitting: *Deleted

## 2014-09-13 NOTE — Telephone Encounter (Signed)
Received email from [facility] that dymista is requiring prior auth.  Left message for pt to send mychart message and let me know other alternatives she may have tried in the past. Form completed and sent to [facility] via cover mymeds.

## 2014-09-13 NOTE — Telephone Encounter (Signed)
Current Hep A vaccine is expired. More has been ordered and will notify pt once it arrives.

## 2014-09-13 NOTE — Telephone Encounter (Signed)
Left message for pt to return my call. See additional phone note re: prior auth.

## 2014-09-14 NOTE — Telephone Encounter (Signed)
Mychart message sent to pt re: below recommendation. Still awaiting receipt of hep a vaccine in the office then will call pt to schedule nurse visit.

## 2014-09-14 NOTE — Telephone Encounter (Signed)
Received denial from insurance for dymista. Spoke with pharmacy and verified that flonase nasal spray is available through insurance for $40 copay and azelastine nasal spray for $80 copay if prescribed separately. Should pt try and purchase otc or is there another alternative RX?

## 2014-09-15 ENCOUNTER — Telehealth: Payer: Self-pay | Admitting: Internal Medicine

## 2014-09-15 ENCOUNTER — Ambulatory Visit (HOSPITAL_BASED_OUTPATIENT_CLINIC_OR_DEPARTMENT_OTHER)
Admission: RE | Admit: 2014-09-15 | Discharge: 2014-09-15 | Disposition: A | Discharge status: Home / Self Care | Payer: 59 | Source: Ambulatory Visit | Attending: Family | Admitting: Family

## 2014-09-15 ENCOUNTER — Encounter (HOSPITAL_BASED_OUTPATIENT_CLINIC_OR_DEPARTMENT_OTHER): Payer: Self-pay

## 2014-09-15 DIAGNOSIS — R1032 Left lower quadrant pain: Secondary | ICD-10-CM | POA: Diagnosis not present

## 2014-09-15 DIAGNOSIS — N8320 Unspecified ovarian cysts: Secondary | ICD-10-CM | POA: Insufficient documentation

## 2014-09-15 DIAGNOSIS — R112 Nausea with vomiting, unspecified: Secondary | ICD-10-CM | POA: Insufficient documentation

## 2014-09-15 DIAGNOSIS — R1011 Right upper quadrant pain: Secondary | ICD-10-CM | POA: Insufficient documentation

## 2014-09-15 DIAGNOSIS — Z90710 Acquired absence of both cervix and uterus: Secondary | ICD-10-CM | POA: Insufficient documentation

## 2014-09-15 MED ORDER — IOHEXOL 300 MG/ML  SOLN
100.0000 mL | Freq: Once | INTRAMUSCULAR | Status: AC | PRN
  Administered 2014-09-15: 100 mL via INTRAVENOUS

## 2014-09-15 NOTE — Telephone Encounter (Signed)
Received call from [place] at [facility].  Patient in for CT of abd and pelvis with IV contrast.  They have had difficulty with obtaining IV site.  They were unsuccessful x 3.  Discussed whether to obtain CT w/o contrast.  Reviewed chart.  I agree we should proceed with study with IV contrast given her hx of breast cancer.  They will retry using ultrasound.

## 2014-09-16 ENCOUNTER — Encounter: Payer: Self-pay | Admitting: Family

## 2014-09-16 DIAGNOSIS — IMO0002 Reserved for concepts with insufficient information to code with codable children: Secondary | ICD-10-CM

## 2014-09-16 DIAGNOSIS — R1032 Left lower quadrant pain: Secondary | ICD-10-CM

## 2014-09-17 NOTE — Telephone Encounter (Signed)
We have received more Hep A vaccine in the office. Mychart message has been sent to pt to call and schedule nurse visit.

## 2014-09-17 NOTE — Telephone Encounter (Signed)
Sent mychart message to pt to let us know how she wants to proceed.

## 2014-09-17 NOTE — Telephone Encounter (Signed)
Please notify pt (she has some pending mychart messages as well if you speak with her). If she is willing to pay for azelastine ok to send rx for one spray each nostril bid #1 with 5 refills.

## 2014-09-21 NOTE — Telephone Encounter (Signed)
pls contact pt re: unread message. thanks

## 2014-09-21 NOTE — Addendum Note (Signed)
Addended by: Kelle Darting A on: 09/21/2014 02:24 PM   Modules accepted: Orders

## 2014-09-21 NOTE — Telephone Encounter (Signed)
Notified pt of above mychart messages and she voices understanding. She is agreeable to proceed with u/s and referral and states GYN has already contacted her for appt. She will call back to schedule lab appt to coincide with u/s once it is scheduled.

## 2014-09-28 ENCOUNTER — Ambulatory Visit (HOSPITAL_BASED_OUTPATIENT_CLINIC_OR_DEPARTMENT_OTHER)
Admission: RE | Admit: 2014-09-28 | Discharge: 2014-09-28 | Disposition: A | Discharge status: Home / Self Care | Payer: 59 | Source: Ambulatory Visit | Attending: Family | Admitting: Family

## 2014-09-28 DIAGNOSIS — N839 Noninflammatory disorder of ovary, fallopian tube and broad ligament, unspecified: Secondary | ICD-10-CM | POA: Diagnosis not present

## 2014-09-28 DIAGNOSIS — R188 Other ascites: Secondary | ICD-10-CM | POA: Insufficient documentation

## 2014-09-28 DIAGNOSIS — N9489 Other specified conditions associated with female genital organs and menstrual cycle: Secondary | ICD-10-CM | POA: Diagnosis present

## 2014-09-28 DIAGNOSIS — IMO0002 Reserved for concepts with insufficient information to code with codable children: Secondary | ICD-10-CM

## 2014-09-29 ENCOUNTER — Telehealth: Payer: Self-pay | Admitting: *Deleted

## 2014-09-29 NOTE — Telephone Encounter (Signed)
Left message requesting that pt return my call. When pt calls back please let her know that I reviewed her [place]. It notes + complex cyst.  Radiologist is recommending referral for possible removal.  I see that she has appointment with GYN on 6/7. I have forwarded info to Elon Alas NP who will be seeing her.  I would like her to keep this appointment.

## 2014-09-29 NOTE — Telephone Encounter (Signed)
Pt called wanting u/s results.  Please advise.

## 2014-09-29 NOTE — Telephone Encounter (Signed)
Notified pt and scheduled nurse visit for 10/01/14 at 10am and lab appt for 10:15am. See previous orders.   Please see below message from [facility] re: recent test results and immunizations:   Hep B testing shows immunity to Hep B. I would recommend that she begin Hep A series.   We are currently out of hepatitis A vaccine but should have some in the office soon. I will contact you once we receive the vaccine and schedule a nurse visit to start this series

## 2014-09-30 NOTE — Telephone Encounter (Signed)
Also, when she calls back, please review unread mychart message 5/19

## 2014-09-30 NOTE — Telephone Encounter (Signed)
Left message on machine for pt to return my call  

## 2014-09-30 NOTE — Telephone Encounter (Signed)
09/16/14 mychart message has already been addressed with pt. Awaiting call back.

## 2014-09-30 NOTE — Telephone Encounter (Signed)
Notified pt and she voices understanding. 

## 2014-10-01 ENCOUNTER — Ambulatory Visit (INDEPENDENT_AMBULATORY_CARE_PROVIDER_SITE_OTHER): Payer: 59 | Admitting: *Deleted

## 2014-10-01 ENCOUNTER — Telehealth: Payer: Self-pay | Admitting: *Deleted

## 2014-10-01 ENCOUNTER — Other Ambulatory Visit (INDEPENDENT_AMBULATORY_CARE_PROVIDER_SITE_OTHER): Payer: 59

## 2014-10-01 DIAGNOSIS — R1032 Left lower quadrant pain: Secondary | ICD-10-CM | POA: Diagnosis not present

## 2014-10-01 DIAGNOSIS — Z23 Encounter for immunization: Secondary | ICD-10-CM | POA: Diagnosis not present

## 2014-10-01 LAB — CBC WITH DIFFERENTIAL/PLATELET
Basophils Absolute: 0 10*3/uL (ref 0.0–0.1)
Basophils Relative: 0.5 % (ref 0.0–3.0)
EOS ABS: 0 10*3/uL (ref 0.0–0.7)
Eosinophils Relative: 0.7 % (ref 0.0–5.0)
HCT: 40.4 % (ref 36.0–46.0)
Hemoglobin: 13.1 g/dL (ref 12.0–15.0)
Lymphocytes Relative: 38.5 % (ref 12.0–46.0)
Lymphs Abs: 1.3 10*3/uL (ref 0.7–4.0)
MCHC: 32.5 g/dL (ref 30.0–36.0)
MCV: 86 fl (ref 78.0–100.0)
MONO ABS: 0.3 10*3/uL (ref 0.1–1.0)
MONOS PCT: 8.9 % (ref 3.0–12.0)
NEUTROS ABS: 1.8 10*3/uL (ref 1.4–7.7)
NEUTROS PCT: 51.4 % (ref 43.0–77.0)
PLATELETS: 189 10*3/uL (ref 150.0–400.0)
RBC: 4.69 Mil/uL (ref 3.87–5.11)
RDW: 13.4 % (ref 11.5–15.5)
WBC: 3.5 10*3/uL — ABNORMAL LOW (ref 4.0–10.5)

## 2014-10-01 LAB — SEDIMENTATION RATE: Sed Rate: 9 mm/hr (ref 0–22)

## 2014-10-01 LAB — BASIC METABOLIC PANEL
BUN: 7 mg/dL (ref 6–23)
CALCIUM: 8.8 mg/dL (ref 8.4–10.5)
CO2: 28 meq/L (ref 19–32)
Chloride: 106 mEq/L (ref 96–112)
Creatinine, Ser: 0.76 mg/dL (ref 0.40–1.20)
GFR: 106.31 mL/min (ref >60.00)
Glucose, Bld: 83 mg/dL (ref 70–99)
Potassium: 3.8 mEq/L (ref 3.5–5.1)
SODIUM: 137 meq/L (ref 135–145)

## 2014-10-01 NOTE — Progress Notes (Signed)
Pre visit review using our clinic review tool, if applicable. No additional management support is needed unless otherwise documented below in the visit note.  Per phone note 09/29/14:   Ronny Flurry, CMA at 09/29/2014 1:59 PM     Status: Signed       Expand All Collapse All   Notified pt and scheduled nurse visit for 10/01/14 at 10am and lab appt for 10:15am. See previous orders.   Please see below message from [facility] re: recent test results and immunizations:   Hep B testing shows immunity to Hep B. I would recommend that she begin Hep A series.         Patient tolerated injection well. Patient notified that next injection due in 6 months, but patient will be out of country beginning in August.  Note routed to Debbrah Alar, NP to request if vaccination can be completed sooner.

## 2014-10-01 NOTE — Telephone Encounter (Signed)
Patient received Hep A vaccination today.  Next vaccination due in 6 months, but patient states she will be leaving for [place] beginning in August and wonders if she can get the second dose before she leaves.  Please advise.

## 2014-10-05 ENCOUNTER — Ambulatory Visit (INDEPENDENT_AMBULATORY_CARE_PROVIDER_SITE_OTHER): Payer: 59 | Admitting: Women's Health

## 2014-10-05 ENCOUNTER — Encounter: Payer: Self-pay | Admitting: Women's Health

## 2014-10-05 VITALS — BP 134/80 | Ht 67.0 in | Wt 180.0 lb

## 2014-10-05 DIAGNOSIS — N8320 Unspecified ovarian cysts: Secondary | ICD-10-CM

## 2014-10-05 DIAGNOSIS — N83201 Unspecified ovarian cyst, right side: Secondary | ICD-10-CM | POA: Insufficient documentation

## 2014-10-05 DIAGNOSIS — Z01419 Encounter for gynecological examination (general) (routine) without abnormal findings: Secondary | ICD-10-CM

## 2014-10-05 NOTE — Patient Instructions (Signed)
Ovarian Cystectomy, Care After Refer to this sheet in the next few weeks. These instructions provide you with information on caring for yourself after your procedure. Your health care provider may also give you more specific instructions. Your treatment has been planned according to current medical practices, but problems sometimes occur. Call your health care provider if you have any problems or questions after your procedure.  WHAT TO EXPECT AFTER THE PROCEDURE After your procedure, it is typical to have the following:  Pain in your abdomen, especially at the incision sites. You will be given pain medicines to control the pain.  Tiredness. This is a normal part of the recovery process. Your energy level will return to normal over the next several weeks.  Constipation. HOME CARE INSTRUCTIONS  Only take over-the-counter or prescription medicines as directed by your health care provider. Avoid taking aspirin because it can cause bleeding.   Follow your health care provider's instructions for when to resume your regular diet, exercise, and activities.   Take rest breaks during the day as needed.  Do not douche or have sexual intercourse until you have permission from your health care provider.   Remove or change any bandages (dressings) as directed by your health care provider.   Do not drive until your health care provider approves.   Take showers instead of baths until your health care provider tells you otherwise.   If you become constipated, you may:   Use a mild laxative if your health care provider approves.  Add more fruit and bran to your diet.  Drink more fluids.  Take your temperature twice a day and record it.   Do not drink alcohol while taking pain medicine.   Try to have someone home with you for the first 1-2 weeks to help with your household activities.   Follow up with your health care provider as directed. SEEK MEDICAL CARE IF:  You have a fever.    You feel sick to your stomach (nauseous) and throw up (vomit).   You have redness, swelling, or leakage of fluid at the incision site.   You have pain when you urinate or have blood in your urine.   You have a rash on your body.   You have pain or redness where the IV tube was inserted.   You have pain that is not relieved with medicine.  SEEK IMMEDIATE MEDICAL CARE IF:  You have chest pain or shortness of breath.   You feel dizzy or lightheaded.   You have increasing abdominal pain that is not relieved with medicines.   You have pain, swelling, or redness in your leg.   You see a yellowish white fluid (pus) coming from the incision.   Your incision is opening (edges not staying together).  Document Released: 02/04/2013 Document Reviewed: 02/04/2013 [facility] Patient Information 2015 [place], [place]. This information is not intended to replace advice given to you by your health care provider. Make sure you discuss any questions you have with your health care provider. Ovarian Cystectomy Ovarian cystectomy is surgery to remove a fluid-filled sac (cyst) on an ovary. The ovaries are small organs that produce eggs in women. Various types of cysts can form on the ovaries. Most are not cancerous. Surgery may be done if a cyst is large or is causing symptoms such as pain. It may also be done for a cyst that is or might be cancerous. This surgery can be done using a laparoscopic technique or an open abdominal technique.  The laparoscopic technique involves smaller cuts (incisions) and a faster recovery time. The technique used will depend on your age, the type of cyst, and whether the cyst is cancerous. The laparoscopic technique is not used for a cancerous cyst. LET [facility] CARE PROVIDER KNOW ABOUT:   Any allergies you have.  All medicines you are taking, including vitamins, herbs, eye drops, creams, and over-the-counter medicines.  Previous problems you or members of  your family have had with the use of anesthetics.  Any blood disorders you have.  Previous surgeries you have had.  Medical conditions you have.  Any chance you might be pregnant. RISKS AND COMPLICATIONS Generally, this is a safe procedure. However, as with any procedure, complications can occur. Possible complications include:  Excessive bleeding.  Infection.  Injury to other organs.  Blood clots.  Becoming incapable of getting pregnant (infertile). BEFORE THE PROCEDURE  Ask your health care provider about changing or stopping any regular medicines. Avoid taking aspirin, ibuprofen, or blood thinners as directed by your health care provider.  Do not eat or drink anything after midnight the night before surgery.  If you smoke, do not smoke for at least 2 weeks before your surgery.  Do not drink alcohol the day before your surgery.  Let your health care provider know if you develop a cold or any infection before your surgery.  Arrange for someone to drive you home after the procedure or after your hospital stay. Also arrange for someone to help you with activities during recovery. PROCEDURE  Either a laparoscopic technique or an open abdominal technique may be used for this surgery.  Small monitors will be put on your body. They are used to check your heart, blood pressure, and oxygen level.   An IV access tube will be put into one of your veins. Medicine will be able to flow directly into your body through this IV tube.   You might be given a medicine to help you relax (sedative).   You will be given a medicine to make you sleep (general anesthetic). A breathing tube may be placed into your lungs during the procedure. Laparoscopic Technique  Several small cuts (incisions) are made in your abdomen. These are typically about 1 to 2 cm long.   Your abdomen will be filled with carbon dioxide gas so that it expands. This gives the surgeon more room to operate and makes  your organs easier to see.   A thin, lighted tube with a tiny camera on the end (laparoscope) is put through one of the small incisions. The camera on the laparoscope sends a picture to a TV screen in the operating room. This gives the surgeon a good view inside your abdomen.   Hollow tubes are put through the other small incisions in your abdomen. The tools needed for the procedure are put through these tubes.  The ovary with the cyst is identified, and the cyst is removed. It is sent to the lab for testing. If it is cancer, both ovaries may need to be removed during a different surgery.  Tools are removed. The incisions are then closed with stitches or skin glue, and dressings may be applied. Open Abdominal Technique  A single large incision is made along your bikini line or in the middle of your lower abdomen.  The ovary with the cyst is identified, and the cyst is removed. It is sent to the lab for testing. If it is cancer, both ovaries may need to be  removed during a different surgery.  The incision is then closed with stitches or staples. AFTER THE PROCEDURE   You will wake up from anesthesia and be taken to a recovery area.  If you had laparoscopic surgery, you may be able to go home the same day, or you may need to stay in the hospital overnight.  If you had open abdominal surgery, you will need to stay in the hospital for a few days.  Your IV access tube and catheter will be removed the first or second day, after you are able to eat and drink enough.  You may be given medicine to relieve pain or to help you sleep.  You may be given an antibiotic medicine if needed. Document Released: 02/11/2007 Document Revised: 02/04/2013 Document Reviewed: 11/26/2012 [facility] Patient Information 2015 [place], [place]. This information is not intended to replace advice given to you by your health care provider. Make sure you discuss any questions you have with your health care  provider. Ovarian Cyst An ovarian cyst is a fluid-filled sac that forms on an ovary. The ovaries are small organs that produce eggs in women. Various types of cysts can form on the ovaries. Most are not cancerous. Many do not cause problems, and they often go away on their own. Some may cause symptoms and require treatment. Common types of ovarian cysts include:  Functional cysts--These cysts may occur every month during the menstrual cycle. This is normal. The cysts usually go away with the next menstrual cycle if the woman does not get pregnant. Usually, there are no symptoms with a functional cyst.  Endometrioma cysts--These cysts form from the tissue that lines the uterus. They are also called "chocolate cysts" because they become filled with blood that turns brown. This type of cyst can cause pain in the lower abdomen during intercourse and with your menstrual period.  Cystadenoma cysts--This type develops from the cells on the outside of the ovary. These cysts can get very big and cause lower abdomen pain and pain with intercourse. This type of cyst can twist on itself, cut off its blood supply, and cause severe pain. It can also easily rupture and cause a lot of pain.  Dermoid cysts--This type of cyst is sometimes found in both ovaries. These cysts may contain different kinds of body tissue, such as skin, teeth, hair, or cartilage. They usually do not cause symptoms unless they get very big.  Theca lutein cysts--These cysts occur when too much of a certain hormone (human chorionic gonadotropin) is produced and overstimulates the ovaries to produce an egg. This is most common after procedures used to assist with the conception of a baby (in vitro fertilization). CAUSES   Fertility drugs can cause a condition in which multiple large cysts are formed on the ovaries. This is called ovarian hyperstimulation syndrome.  A condition called polycystic ovary syndrome can cause hormonal imbalances that  can lead to nonfunctional ovarian cysts. SIGNS AND SYMPTOMS  Many ovarian cysts do not cause symptoms. If symptoms are present, they may include:  Pelvic pain or pressure.  Pain in the lower abdomen.  Pain during sexual intercourse.  Increasing girth (swelling) of the abdomen.  Abnormal menstrual periods.  Increasing pain with menstrual periods.  Stopping having menstrual periods without being pregnant. DIAGNOSIS  These cysts are commonly found during a routine or annual pelvic exam. Tests may be ordered to find out more about the cyst. These tests may include:  Ultrasound.  X-ray of the pelvis.  CT scan.  MRI.  Blood tests. TREATMENT  Many ovarian cysts go away on their own without treatment. Your health care provider may want to check your cyst regularly for 2-3 months to see if it changes. For women in menopause, it is particularly important to monitor a cyst closely because of the higher rate of ovarian cancer in menopausal women. When treatment is needed, it may include any of the following:  A procedure to drain the cyst (aspiration). This may be done using a long needle and ultrasound. It can also be done through a laparoscopic procedure. This involves using a thin, lighted tube with a tiny camera on the end (laparoscope) inserted through a small incision.  Surgery to remove the whole cyst. This may be done using laparoscopic surgery or an open surgery involving a larger incision in the lower abdomen.  Hormone treatment or birth control pills. These methods are sometimes used to help dissolve a cyst. HOME CARE INSTRUCTIONS   Only take over-the-counter or prescription medicines as directed by your health care provider.  Follow up with your health care provider as directed.  Get regular pelvic exams and Pap tests. SEEK MEDICAL CARE IF:   Your periods are late, irregular, or painful, or they stop.  Your pelvic pain or abdominal pain does not go away.  Your  abdomen becomes larger or swollen.  You have pressure on your bladder or trouble emptying your bladder completely.  You have pain during sexual intercourse.  You have feelings of fullness, pressure, or discomfort in your stomach.  You lose weight for no apparent reason.  You feel generally ill.  You become constipated.  You lose your appetite.  You develop acne.  You have an increase in body and facial hair.  You are gaining weight, without changing your exercise and eating habits.  You think you are pregnant. SEEK IMMEDIATE MEDICAL CARE IF:   You have increasing abdominal pain.  You feel sick to your stomach (nauseous), and you throw up (vomit).  You develop a fever that comes on suddenly.  You have abdominal pain during a bowel movement.  Your menstrual periods become heavier than usual. MAKE SURE YOU:  Understand these instructions.  Will watch your condition.  Will get help right away if you are not doing well or get worse. Document Released: 04/16/2005 Document Revised: 04/21/2013 Document Reviewed: 12/22/2012 [facility] Patient Information 2015 [place], [place]. This information is not intended to replace advice given to you by your health care provider. Make sure you discuss any questions you have with your health care provider.

## 2014-10-05 NOTE — Progress Notes (Signed)
Patient ID: Tammie [name], female   DOB: Apr 27, 1971, 44 y.o.   MRN: [id number] New patient problem visit referral. Reports having intermittent low abdominal pelvic cramping similar to menstrual cramping since January, 2016 becoming progressively worse. Was seen at primary care, first pelvic CT 09/15/2014 showed questionable cystic lesion. 09/29/2014 and was noted to have on the right ovary [id number] cm and [id number] cm ovarian cysts with Doppler/color flow through septations.  History of TVH for fibroids 01/2011, LSO 07/2011 for large ovarian cyst/questionable torsion. 2013 right breast cancer with positive node, positive ER, PR, HER-2/ negative.  Mastectomy with reconstruction, chemotherapy and radiation at [facility]. BRCA negative. On tamoxifen, followed at [facility]. 2008 gastric bypass surgery had been down 100 pounds and has  gained some of that back. 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 89 year old daughter Valinda Party, semester abroad in [place] doing well.  Exam: Appears well. Lungs clear throughout, heart regular rate with mild murmur, (reports no medications.) Abdomen soft without rebound or radiation of pain.  Right ovary  6.2 complex mass with thickened septations and associated vascularity  through septations TVH fibroids, LSO ovarian cysts 2013 right breast cancer positive node, positive ER, PR, HER-2/neu negative.(Mastectomy, chemotherapy, radiation, BRCA negative) 2008 gastric bypass  Plan: CA-125. Consult Dr. Phineas Real to discuss possible surgery.

## 2014-10-06 ENCOUNTER — Ambulatory Visit (INDEPENDENT_AMBULATORY_CARE_PROVIDER_SITE_OTHER): Payer: 59 | Admitting: Gynecology

## 2014-10-06 ENCOUNTER — Encounter: Payer: Self-pay | Admitting: Gynecology

## 2014-10-06 ENCOUNTER — Encounter: Payer: Self-pay | Admitting: Women's Health

## 2014-10-06 VITALS — BP 130/84

## 2014-10-06 DIAGNOSIS — N83201 Unspecified ovarian cyst, right side: Secondary | ICD-10-CM

## 2014-10-06 DIAGNOSIS — N8320 Unspecified ovarian cysts: Secondary | ICD-10-CM

## 2014-10-06 LAB — URINALYSIS W MICROSCOPIC + REFLEX CULTURE
Bacteria, UA: NONE SEEN
Bilirubin Urine: NEGATIVE
CASTS: NONE SEEN
Crystals: NONE SEEN
Glucose, UA: NEGATIVE mg/dL
Hgb urine dipstick: NEGATIVE
KETONES UR: NEGATIVE mg/dL
Leukocytes, UA: NEGATIVE
Nitrite: NEGATIVE
PH: 6 (ref 5.0–8.0)
Protein, ur: NEGATIVE mg/dL
SQUAMOUS EPITHELIAL / LPF: NONE SEEN
Specific Gravity, Urine: 1.009 (ref 1.005–1.030)
Urobilinogen, UA: 1 mg/dL (ref 0.0–1.0)

## 2014-10-06 LAB — CA 125: CA 125: 6 U/mL (ref <35)

## 2014-10-06 NOTE — Progress Notes (Signed)
Tammie [name] July 22, 1970 [id number]        44 y.o.  G1P1 presents having recently seen [place] with a history of lower abdominal discomfort and cramping. Past history of TVH for bleeding and leiomyoma 2012. Subsequent LSO for large ovarian cystic mass which ultimately showed endometrioma 2013. Had CT scan 08/2014 which showed a cystic area 5.8 cm in the right pelvis suspicious for ovarian. No ascites or pelvic or periaortic adenopathy.  Follow up ultrasound showed a 6-7 cm multiloculated cystic area with some vascular flow right ovary.  Follow up CA-125 6. Patient does note dyspareunia with intercourse causing pelvic cramping as well as the spontaneous on and off cramping.  Past medical history,surgical history, problem list, medications, allergies, family history and social history were all reviewed and documented in the EPIC chart.  Directed ROS with pertinent positives and negatives documented in the history of present illness/assessment and plan.  Exam: Kim assistant Filed Vitals:   10/06/14 1553  BP: 130/84   General appearance:  Normal Abdomen soft nontender without masses guarding rebound Pelvic external BUS vagina normal. Bimanual exam with mass felt at right cuff approximately 7 cm. No significant tenderness.  Assessment/Plan:  44 y.o. G1P1 with right cystic ovarian mass loculated with septums and vascular flow. CA-125 6 with no evidence of ascites or adenopathy. Prior LSO for endometriosis where they described a complex left ovarian cystic mass. Pelvic adhesions described at that time with intraoperative consult for urologist to help identify the ureter. Case was accomplished laparoscopically. Prior [facility] for leiomyoma/menorrhagia. Reviewed situation with the patient to include differential to include benign versus malignant. As she is having discomfort she wants to proceed with surgery. Although unlikely malignant certainly possible and she understands that if I operate I will remove the  ovary on that side and if ultimately turns out to be malignant then she would require referral to gynecologic oncologist for further surgery to include lymph node dissection and subsequent treatment. Alternatives would be referral to gynecologic oncologist now for their assessment and possible surgery. Patient feels comfortable with my proceeding with surgery clearly understanding and accepting the risks of malignancy and need for referral to a gynecologic oncologist. I reviewed also given her past surgical history the realistic risk of converting a laparoscopic approach to an exploratory laparoscopy approach with a larger incision and a longer recovery period. General risks of surgery were also reviewed to include infection, prolonged antibiotics, reoperation for abscess or hematoma formation. Hemorrhage with subsequent transfusion and risks of transfusion including transfusion reaction, hepatitis, HIV, mad cow disease and other unknown entities. Incisional complications requiring opening and draining of incisions and closure by secondary intention and long-term issues of cosmetics keloid and hernia formation also discussed. The risks of inadvertent injury to internal organs, increased with prior surgery and scarring including bowel, bladder, ureters, vessels and nerves necessitating major exploratory reparative surgeries and future reparative surgeries including ostomy formation, bowel resection, bladder repair, ureteral damage repair was all discussed understood and accepted. Patient's questions were answered and she wants to proceed with surgery. We'll schedule her for this and she'll represent for a preoperative consult.    Anastasio Auerbach MD, 4:30 PM 10/06/2014

## 2014-10-06 NOTE — Patient Instructions (Signed)
Office will call you to arrange surgery. 

## 2014-10-07 ENCOUNTER — Encounter: Payer: Self-pay | Admitting: Gynecology

## 2014-10-12 ENCOUNTER — Other Ambulatory Visit: Payer: Self-pay | Admitting: Gynecology

## 2014-10-12 ENCOUNTER — Telehealth: Payer: Self-pay

## 2014-10-12 MED ORDER — PEG-KCL-NACL-NASULF-NA ASC-C 100 G PO SOLR: 1.0000 | Freq: Once | ORAL | Status: DC

## 2014-10-12 NOTE — Telephone Encounter (Signed)
I called patient to let her know that Dr. Phineas Real wants her to use a bowel prep the day prior to surgery. I told her she will need to be on clear liquid diet day prior and I will send Rx to pharmacy for the prep. I reviewed the instruction sheet that I will mail her and also mailed her a clear liquid diet sheet.  She will let me know if she has any questions. Rx sent to her pharmacy.

## 2014-10-14 ENCOUNTER — Encounter: Payer: Self-pay | Admitting: Gynecology

## 2014-10-15 ENCOUNTER — Telehealth: Payer: Self-pay

## 2014-10-15 NOTE — Telephone Encounter (Addendum)
Patient called from the pharmacy. The Movi-Prep bowel prep has an $80 copayment and she cannot afford it.  She asked is there something else she can do? (Pt. Informed Dr. Loetta Rough off today and it might be Monday before I get back with her.)

## 2014-10-18 ENCOUNTER — Ambulatory Visit (INDEPENDENT_AMBULATORY_CARE_PROVIDER_SITE_OTHER): Payer: 59 | Admitting: Gynecology

## 2014-10-18 ENCOUNTER — Encounter (HOSPITAL_COMMUNITY)
Admission: RE | Admit: 2014-10-18 | Discharge: 2014-10-18 | Disposition: A | Discharge status: Home / Self Care | Payer: 59 | Source: Ambulatory Visit | Attending: Gynecology | Admitting: Gynecology

## 2014-10-18 ENCOUNTER — Encounter (HOSPITAL_COMMUNITY): Payer: Self-pay

## 2014-10-18 ENCOUNTER — Other Ambulatory Visit: Payer: Self-pay | Admitting: Nurse Practitioner

## 2014-10-18 ENCOUNTER — Encounter: Payer: Self-pay | Admitting: Gynecology

## 2014-10-18 VITALS — BP 122/76

## 2014-10-18 DIAGNOSIS — N8320 Unspecified ovarian cysts: Secondary | ICD-10-CM | POA: Insufficient documentation

## 2014-10-18 DIAGNOSIS — Z01818 Encounter for other preprocedural examination: Secondary | ICD-10-CM | POA: Insufficient documentation

## 2014-10-18 DIAGNOSIS — N83201 Unspecified ovarian cyst, right side: Secondary | ICD-10-CM

## 2014-10-18 DIAGNOSIS — E559 Vitamin D deficiency, unspecified: Secondary | ICD-10-CM

## 2014-10-18 HISTORY — DX: Depression, unspecified: F32.A

## 2014-10-18 HISTORY — DX: Major depressive disorder, single episode, unspecified: F32.9

## 2014-10-18 HISTORY — DX: Personal history of other diseases of the digestive system: Z87.19

## 2014-10-18 HISTORY — DX: Essential (primary) hypertension: I10

## 2014-10-18 LAB — COMPREHENSIVE METABOLIC PANEL
ALBUMIN: 4 g/dL (ref 3.5–5.0)
ALT: 36 U/L (ref 14–54)
ANION GAP: 4 — AB (ref 5–15)
AST: 29 U/L (ref 15–41)
Alkaline Phosphatase: 96 U/L (ref 38–126)
BILIRUBIN TOTAL: 0.2 mg/dL — AB (ref 0.3–1.2)
BUN: 10 mg/dL (ref 6–20)
CHLORIDE: 109 mmol/L (ref 101–111)
CO2: 25 mmol/L (ref 22–32)
Calcium: 8.7 mg/dL — ABNORMAL LOW (ref 8.9–10.3)
Creatinine, Ser: 0.87 mg/dL (ref 0.44–1.00)
GFR calc non Af Amer: >60 mL/min (ref >60)
GLUCOSE: 90 mg/dL (ref 65–99)
Potassium: 3.9 mmol/L (ref 3.5–5.1)
Sodium: 138 mmol/L (ref 135–145)
Total Protein: 7.5 g/dL (ref 6.5–8.1)

## 2014-10-18 LAB — CBC
HCT: 40.7 % (ref 36.0–46.0)
HEMOGLOBIN: 13.3 g/dL (ref 12.0–15.0)
MCH: 27.8 pg (ref 26.0–34.0)
MCHC: 32.7 g/dL (ref 30.0–36.0)
MCV: 85.1 fL (ref 78.0–100.0)
PLATELETS: 192 10*3/uL (ref 150–400)
RBC: 4.78 MIL/uL (ref 3.87–5.11)
RDW: 12.7 % (ref 11.5–15.5)
WBC: 5.1 10*3/uL (ref 4.0–10.5)

## 2014-10-18 LAB — TYPE AND SCREEN
ABO/RH(D): O NEG
ANTIBODY SCREEN: NEGATIVE

## 2014-10-18 MED ORDER — ERGOCALCIFEROL 1.25 MG (50000 UT) PO CAPS: 50000.0000 [IU] | ORAL_CAPSULE | ORAL | Status: AC

## 2014-10-18 NOTE — Telephone Encounter (Signed)
Magnesium citrate daily before with liquid diet

## 2014-10-18 NOTE — Patient Instructions (Signed)
Followup for surgery as scheduled. 

## 2014-10-18 NOTE — Patient Instructions (Signed)
Your procedure is scheduled on:10/25/14  Enter through the Main Entrance at :25 am Pick up desk phone and dial [phone] and inform us of your arrival.  Please call [phone] if you have any problems the morning of surgery.  Remember: Do not eat food after midnight: Sunday 10/24/14 Clear liquids are ok until:9am on Monday   You may brush your teeth the morning of surgery.  Take these meds the morning of surgery with a sip of water:usual am meds  DO NOT wear jewelry, eye make-up, lipstick,body lotion, or dark fingernail polish.  (Polished toes are ok) You may wear deodorant.  If you are to be admitted after surgery, leave suitcase in car until your room has been assigned. Patients discharged on the day of surgery will not be allowed to drive home. Wear loose fitting, comfortable clothes for your ride home.

## 2014-10-18 NOTE — Telephone Encounter (Signed)
The patient has an appointment to see me today I will discuss this with her then

## 2014-10-18 NOTE — Progress Notes (Signed)
Tammie [name] 11/25/1970 [id number]   Preoperative consult  Chief complaint: right ovarian cystic mass  History of present illness: 44 y.o. G1P1 with a history of lower abdominal discomfort and cramping. Past history of TVH for bleeding and leiomyoma 2012. Subsequent LSO for large ovarian cystic mass which ultimately showed endometrioma 2013. Had CT scan 08/2014 which showed a cystic area 5.8 cm in the right pelvis suspicious for ovarian. No ascites or pelvic or periaortic adenopathy.  Follow up ultrasound showed a 6-7 cm multiloculated cystic area with some vascular flow right ovary.  Follow up CA-125 6. Patient does note dyspareunia with intercourse causing pelvic cramping as well as the spontaneous on and off cramping.  Patients admitted for attempted laparoscopic RSO, possible exploratory laparotomy RSO.   Past medical history,surgical history, medications, allergies, family history and social history were all reviewed and documented in the EPIC chart.  ROS:  Was performed and pertinent positives and negatives are included in the history of present illness.  Exam:  Kim assistant General: well developed, well nourished female, no acute distress HEENT: normal  Lungs: clear to auscultation without wheezing, rales or rhonchi  Cardiac: regular rate without rubs, murmurs or gallops  Abdomen: soft, nontender without masses, guarding, rebound, organomegaly  Pelvic: external bus vagina: with 6-7 cm palpable area right vaginal cuff.    Assessment/Plan:  44 y.o. G1P1 with right cystic ovarian mass loculated with septums and vascular flow. CA-125 6 with no evidence of ascites or adenopathy. Prior LSO for endometriosis where they described a complex left ovarian cystic mass. Pelvic adhesions described at that time with intraoperative consult for urologist to help identify the ureter. Case was accomplished laparoscopically. Prior [facility] for leiomyoma/menorrhagia. We will plan on a laparoscopic right  salpingo-oophorectomy with a realistic understanding that I may convert to an exploratory laparotomy if adhesions or other issues are encountered or complications arise which would mean a larger incision and a longer recovery period.  We also again discussed the potential of this being a malignancy that would require referral to a gynecologic oncologist for further treatment and as noted in my 10/06/2014 note I had offered referral to a gynecologic oncologist now with the option for them to perform the surgery and the patient declines comfortable with me doing the surgery and recognizing the possible need for referral afterwards. We also discussed that if indeed it is a malignancy particularly if rupture of the mass occurs that this may change her staging and prognosis and she understands and accepts this. We also discussed whether we should attempt a cystectomy and leave some remaining ovarian tissue for continued hormone production recognizing her history of breast cancer with receptor positive in that if we remove her ovary in total and she would have significant symptoms such as hot flashes and sweats that it would be difficult to replace her with estrogen given this history and she would have to tolerate the symptoms or try nonhormonal alternatives. Removing her ovary would be beneficial from a breast cancer recurrence risk but again she would have to accept the potential for menopausal symptoms. Patient very strongly feels that she wants the ovary removed so that she does not have to deal with a recurrence of ovarian cysts in the future and she would accept menopausal symptoms if they develope. I reviewed the expected intraoperative and postoperative courses as well as the recovery period with her. Risks were discussed to include infection, prolonged antibiotics, reoperation for abscess or hematoma formation. Hemorrhage with subsequent transfusion and  risks of transfusion including transfusion reaction,  hepatitis, HIV, mad cow disease and other unknown entities. Incisional complications requiring opening and draining of incisions and closure by secondary intention and long-term issues of cosmetics keloid and hernia formation also discussed. The risks of inadvertent injury to internal organs, increased with prior surgery and scarring including bowel, bladder, ureters, vessels and nerves necessitating major exploratory reparative surgeries and future reparative surgeries including ostomy formation, bowel resection, bladder repair, ureteral damage repair was all discussed understood and accepted. Patient's questions were answered and she wants to proceed with surgery.     Anastasio Auerbach MD, 12:36 PM 10/18/2014

## 2014-10-18 NOTE — H&P (Signed)
Tammie [name] [birth year] [id number]   History and Physical  Chief complaint: right ovarian cystic mass  History of present illness: 44 y.o. G1P1 with a history of lower abdominal discomfort and cramping. Past history of TVH for bleeding and leiomyoma 2012. Subsequent LSO for large ovarian cystic mass which ultimately showed endometrioma 2013. Had CT scan 08/2014 which showed a cystic area 5.8 cm in the right pelvis suspicious for ovarian. No ascites or pelvic or periaortic adenopathy.  Follow up ultrasound showed a 6-7 cm multiloculated cystic area with some vascular flow right ovary.  Follow up CA-125 6. Patient does note dyspareunia with intercourse causing pelvic cramping as well as the spontaneous on and off cramping.  Patients admitted for attempted laparoscopic RSO, possible exploratory laparotomy RSO.   Past medical history,surgical history, medications, allergies, family history and social history were all reviewed and documented in the EPIC chart.  ROS:  Was performed and pertinent positives and negatives are included in the history of present illness.  Exam:  Kim assistant  10/18/2014 General: well developed, well nourished female, no acute distress HEENT: normal  Lungs: clear to auscultation without wheezing, rales or rhonchi  Cardiac: regular rate without rubs, murmurs or gallops  Abdomen: soft, nontender without masses, guarding, rebound, organomegaly  Pelvic: external bus vagina: with 6-7 cm palpable area right vaginal cuff.    Assessment/Plan:  44 y.o. G1P1 with right cystic ovarian mass loculated with septums and vascular flow. CA-125 6 with no evidence of ascites or adenopathy. Prior LSO for endometriosis where they described a complex left ovarian cystic mass. Pelvic adhesions described at that time with intraoperative consult for urologist to help identify the ureter. Case was accomplished laparoscopically. Prior [facility] for leiomyoma/menorrhagia. We will plan on a laparoscopic  right salpingo-oophorectomy with a realistic understanding that I may convert to an exploratory laparotomy if adhesions or other issues are encountered or complications arise which would mean a larger incision and a longer recovery period.  We also again discussed the potential of this being a malignancy that would require referral to a gynecologic oncologist for further treatment and as noted in my 10/06/2014 note I had offered referral to a gynecologic oncologist now with the option for them to perform the surgery and the patient declines comfortable with me doing the surgery and recognizing the possible need for referral afterwards. We also discussed that if indeed it is a malignancy particularly if rupture of the mass occurs that this may change her staging and prognosis and she understands and accepts this. We also discussed whether we should attempt a cystectomy and leave some remaining ovarian tissue for continued hormone production recognizing her history of breast cancer with receptor positive in that if we remove her ovary in total and she would have significant symptoms such as hot flashes and sweats that it would be difficult to replace her with estrogen given this history and she would have to tolerate the symptoms or try nonhormonal alternatives. Removing her ovary would be beneficial from a breast cancer recurrence risk but again she would have to accept the potential for menopausal symptoms. Patient very strongly feels that she wants the ovary removed so that she does not have to deal with a recurrence of ovarian cysts in the future and she would accept menopausal symptoms if they develope. I reviewed the expected intraoperative and postoperative courses as well as the recovery period with her. Risks were discussed to include infection, prolonged antibiotics, reoperation for abscess or hematoma formation. Hemorrhage with  subsequent transfusion and risks of transfusion including transfusion reaction,  hepatitis, HIV, mad cow disease and other unknown entities. Incisional complications requiring opening and draining of incisions and closure by secondary intention and long-term issues of cosmetics keloid and hernia formation also discussed. The risks of inadvertent injury to internal organs, increased with prior surgery and scarring including bowel, bladder, ureters, vessels and nerves necessitating major exploratory reparative surgeries and future reparative surgeries including ostomy formation, bowel resection, bladder repair, ureteral damage repair was all discussed understood and accepted. Patient's questions were answered and she wants to proceed with surgery.    Anastasio Auerbach MD, 12:52 PM 10/18/2014

## 2014-10-18 NOTE — Telephone Encounter (Signed)
Patient advised.

## 2014-10-18 NOTE — Telephone Encounter (Signed)
Patient asked did you just want her to use one bottle or how many?

## 2014-10-22 ENCOUNTER — Ambulatory Visit (HOSPITAL_COMMUNITY)
Admission: RE | Admit: 2014-10-22 | Discharge: 2014-10-22 | Disposition: A | Discharge status: Home / Self Care | Payer: 59 | Source: Ambulatory Visit | Attending: Gynecology | Admitting: Gynecology

## 2014-10-22 ENCOUNTER — Other Ambulatory Visit: Payer: Self-pay | Admitting: Gynecology

## 2014-10-22 DIAGNOSIS — I878 Other specified disorders of veins: Secondary | ICD-10-CM

## 2014-10-22 DIAGNOSIS — R1900 Intra-abdominal and pelvic swelling, mass and lump, unspecified site: Secondary | ICD-10-CM | POA: Diagnosis not present

## 2014-10-22 MED ORDER — HEPARIN SOD (PORK) LOCK FLUSH 100 UNIT/ML IV SOLN
INTRAVENOUS | Status: AC
  Administered 2014-10-22: 500 [IU]
  Filled 2014-10-22: qty 5

## 2014-10-22 MED ORDER — LIDOCAINE HCL 1 % IJ SOLN
INTRAMUSCULAR | Status: AC
  Filled 2014-10-22: qty 20

## 2014-10-22 NOTE — Procedures (Signed)
Successful LUE DL POWER PICC INSERTION TIP SVC/RA NO COMP STABLE READY FOR USE

## 2014-10-24 MED ORDER — DEXTROSE 5 % IV SOLN
2.0000 g | INTRAVENOUS | Status: AC
  Administered 2014-10-25: 2 g via INTRAVENOUS
  Filled 2014-10-24: qty 2

## 2014-10-25 ENCOUNTER — Encounter (HOSPITAL_COMMUNITY)
Admission: RE | Disposition: A | Discharge status: Home / Self Care | Payer: Self-pay | Source: Ambulatory Visit | Attending: Gynecology

## 2014-10-25 ENCOUNTER — Ambulatory Visit (HOSPITAL_COMMUNITY): Payer: 59 | Admitting: Anesthesiology

## 2014-10-25 ENCOUNTER — Encounter (HOSPITAL_COMMUNITY): Payer: Self-pay | Admitting: *Deleted

## 2014-10-25 ENCOUNTER — Ambulatory Visit (HOSPITAL_COMMUNITY)
Admission: RE | Admit: 2014-10-25 | Discharge: 2014-10-25 | Disposition: A | Discharge status: Home / Self Care | Payer: 59 | Source: Ambulatory Visit | Attending: Gynecology | Admitting: Gynecology

## 2014-10-25 DIAGNOSIS — Z4802 Encounter for removal of sutures: Secondary | ICD-10-CM | POA: Diagnosis not present

## 2014-10-25 DIAGNOSIS — K219 Gastro-esophageal reflux disease without esophagitis: Secondary | ICD-10-CM | POA: Insufficient documentation

## 2014-10-25 DIAGNOSIS — R011 Cardiac murmur, unspecified: Secondary | ICD-10-CM | POA: Insufficient documentation

## 2014-10-25 DIAGNOSIS — F329 Major depressive disorder, single episode, unspecified: Secondary | ICD-10-CM | POA: Diagnosis not present

## 2014-10-25 DIAGNOSIS — E739 Lactose intolerance, unspecified: Secondary | ICD-10-CM | POA: Insufficient documentation

## 2014-10-25 DIAGNOSIS — Z91018 Allergy to other foods: Secondary | ICD-10-CM | POA: Insufficient documentation

## 2014-10-25 DIAGNOSIS — F419 Anxiety disorder, unspecified: Secondary | ICD-10-CM | POA: Insufficient documentation

## 2014-10-25 DIAGNOSIS — M1990 Unspecified osteoarthritis, unspecified site: Secondary | ICD-10-CM | POA: Diagnosis not present

## 2014-10-25 DIAGNOSIS — I10 Essential (primary) hypertension: Secondary | ICD-10-CM | POA: Insufficient documentation

## 2014-10-25 DIAGNOSIS — N941 Dyspareunia: Secondary | ICD-10-CM | POA: Diagnosis not present

## 2014-10-25 DIAGNOSIS — I878 Other specified disorders of veins: Secondary | ICD-10-CM

## 2014-10-25 DIAGNOSIS — D270 Benign neoplasm of right ovary: Secondary | ICD-10-CM | POA: Diagnosis not present

## 2014-10-25 DIAGNOSIS — N831 Corpus luteum cyst: Secondary | ICD-10-CM | POA: Insufficient documentation

## 2014-10-25 DIAGNOSIS — Z853 Personal history of malignant neoplasm of breast: Secondary | ICD-10-CM | POA: Insufficient documentation

## 2014-10-25 DIAGNOSIS — N8320 Unspecified ovarian cysts: Secondary | ICD-10-CM | POA: Diagnosis not present

## 2014-10-25 DIAGNOSIS — R102 Pelvic and perineal pain: Secondary | ICD-10-CM | POA: Diagnosis not present

## 2014-10-25 DIAGNOSIS — Z91030 Bee allergy status: Secondary | ICD-10-CM | POA: Insufficient documentation

## 2014-10-25 HISTORY — PX: LAPAROSCOPIC UNILATERAL SALPINGO OOPHERECTOMY: SHX5935

## 2014-10-25 SURGERY — SALPINGO-OOPHORECTOMY, UNILATERAL, LAPAROSCOPIC
Anesthesia: General | Site: Abdomen | Laterality: Right

## 2014-10-25 MED ORDER — FENTANYL CITRATE (PF) 100 MCG/2ML IJ SOLN
INTRAMUSCULAR | Status: AC
  Administered 2014-10-25: 25 ug via INTRAVENOUS
  Filled 2014-10-25: qty 2

## 2014-10-25 MED ORDER — ACETAMINOPHEN 160 MG/5ML PO SOLN
ORAL | Status: AC
  Filled 2014-10-25: qty 20.3

## 2014-10-25 MED ORDER — ONDANSETRON HCL 4 MG/2ML IJ SOLN
4.0000 mg | Freq: Once | INTRAMUSCULAR | Status: DC | PRN
Start: 2014-10-25 — End: 2014-10-25

## 2014-10-25 MED ORDER — LIDOCAINE HCL (CARDIAC) 20 MG/ML IV SOLN
INTRAVENOUS | Status: AC
  Filled 2014-10-25: qty 5

## 2014-10-25 MED ORDER — ROCURONIUM BROMIDE 100 MG/10ML IV SOLN
INTRAVENOUS | Status: AC
  Filled 2014-10-25: qty 1

## 2014-10-25 MED ORDER — GLYCOPYRROLATE 0.2 MG/ML IJ SOLN
INTRAMUSCULAR | Status: AC
  Filled 2014-10-25: qty 3

## 2014-10-25 MED ORDER — SODIUM CHLORIDE 0.9 % IJ SOLN
INTRAMUSCULAR | Status: AC
  Filled 2014-10-25: qty 10

## 2014-10-25 MED ORDER — ONDANSETRON HCL 4 MG/2ML IJ SOLN
INTRAMUSCULAR | Status: AC
  Filled 2014-10-25: qty 2

## 2014-10-25 MED ORDER — OXYCODONE-ACETAMINOPHEN 5-325 MG PO TABS: 1.0000 | ORAL_TABLET | ORAL | Status: DC | PRN

## 2014-10-25 MED ORDER — ACETAMINOPHEN 160 MG/5ML PO SOLN
650.0000 mg | Freq: Once | ORAL | Status: AC
  Administered 2014-10-25: 650 mg via ORAL

## 2014-10-25 MED ORDER — LACTATED RINGERS IV SOLN
INTRAVENOUS | Status: DC
  Administered 2014-10-25 (×2): via INTRAVENOUS

## 2014-10-25 MED ORDER — DEXAMETHASONE SODIUM PHOSPHATE 10 MG/ML IJ SOLN
INTRAMUSCULAR | Status: AC
  Filled 2014-10-25: qty 1

## 2014-10-25 MED ORDER — LIDOCAINE HCL (CARDIAC) 20 MG/ML IV SOLN
INTRAVENOUS | Status: DC | PRN
  Administered 2014-10-25: 80 mg via INTRAVENOUS

## 2014-10-25 MED ORDER — DEXAMETHASONE SODIUM PHOSPHATE 10 MG/ML IJ SOLN
INTRAMUSCULAR | Status: DC | PRN
  Administered 2014-10-25: 8 mg via INTRAVENOUS

## 2014-10-25 MED ORDER — MIDAZOLAM HCL 2 MG/2ML IJ SOLN
INTRAMUSCULAR | Status: AC
  Filled 2014-10-25: qty 2

## 2014-10-25 MED ORDER — BUPIVACAINE HCL (PF) 0.25 % IJ SOLN
INTRAMUSCULAR | Status: DC | PRN
  Administered 2014-10-25: 10 mL

## 2014-10-25 MED ORDER — FENTANYL CITRATE (PF) 250 MCG/5ML IJ SOLN
INTRAMUSCULAR | Status: AC
  Filled 2014-10-25: qty 5

## 2014-10-25 MED ORDER — PROPOFOL 10 MG/ML IV BOLUS
INTRAVENOUS | Status: AC
  Filled 2014-10-25: qty 20

## 2014-10-25 MED ORDER — ROCURONIUM BROMIDE 100 MG/10ML IV SOLN
INTRAVENOUS | Status: DC | PRN
  Administered 2014-10-25: 40 mg via INTRAVENOUS
  Administered 2014-10-25: 10 mg via INTRAVENOUS

## 2014-10-25 MED ORDER — SCOPOLAMINE 1 MG/3DAYS TD PT72
MEDICATED_PATCH | TRANSDERMAL | Status: AC
  Administered 2014-10-25: 1.5 mg via TRANSDERMAL
  Filled 2014-10-25: qty 1

## 2014-10-25 MED ORDER — NEOSTIGMINE METHYLSULFATE 10 MG/10ML IV SOLN
INTRAVENOUS | Status: DC | PRN
  Administered 2014-10-25: 4 mg via INTRAVENOUS

## 2014-10-25 MED ORDER — KETOROLAC TROMETHAMINE 30 MG/ML IJ SOLN
INTRAMUSCULAR | Status: AC
  Filled 2014-10-25: qty 1

## 2014-10-25 MED ORDER — BUPIVACAINE HCL (PF) 0.25 % IJ SOLN
INTRAMUSCULAR | Status: AC
  Filled 2014-10-25: qty 30

## 2014-10-25 MED ORDER — GLYCOPYRROLATE 0.2 MG/ML IJ SOLN
INTRAMUSCULAR | Status: DC | PRN
  Administered 2014-10-25: 0.6 mg via INTRAVENOUS

## 2014-10-25 MED ORDER — FENTANYL CITRATE (PF) 100 MCG/2ML IJ SOLN
25.0000 ug | INTRAMUSCULAR | Status: DC | PRN
  Administered 2014-10-25 (×4): 25 ug via INTRAVENOUS

## 2014-10-25 MED ORDER — LACTATED RINGERS IR SOLN
Status: DC | PRN
  Administered 2014-10-25: 3000 mL

## 2014-10-25 MED ORDER — SCOPOLAMINE 1 MG/3DAYS TD PT72
1.0000 | MEDICATED_PATCH | Freq: Once | TRANSDERMAL | Status: DC
  Administered 2014-10-25: 1.5 mg via TRANSDERMAL

## 2014-10-25 MED ORDER — PROPOFOL 10 MG/ML IV BOLUS
INTRAVENOUS | Status: DC | PRN
  Administered 2014-10-25: 100 mg via INTRAVENOUS

## 2014-10-25 MED ORDER — NEOSTIGMINE METHYLSULFATE 10 MG/10ML IV SOLN
INTRAVENOUS | Status: AC
  Filled 2014-10-25: qty 1

## 2014-10-25 MED ORDER — MIDAZOLAM HCL 2 MG/2ML IJ SOLN
INTRAMUSCULAR | Status: DC | PRN
  Administered 2014-10-25: 2 mg via INTRAVENOUS

## 2014-10-25 MED ORDER — FENTANYL CITRATE (PF) 100 MCG/2ML IJ SOLN
INTRAMUSCULAR | Status: DC | PRN
  Administered 2014-10-25: 100 ug via INTRAVENOUS
  Administered 2014-10-25 (×3): 50 ug via INTRAVENOUS

## 2014-10-25 SURGICAL SUPPLY — 61 items
BARRIER ADHS 3X4 INTERCEED (GAUZE/BANDAGES/DRESSINGS) IMPLANT
BLADE SURG 10 STRL SS (BLADE) ×8 IMPLANT
BLADE SURG 15 STRL LF C SS BP (BLADE) ×2 IMPLANT
BLADE SURG 15 STRL SS (BLADE) ×2
CABLE HIGH FREQUENCY MONO STRZ (ELECTRODE) IMPLANT
CANISTER SUCT 3000ML (MISCELLANEOUS) ×4 IMPLANT
CATH ROBINSON RED A/P 16FR (CATHETERS) ×4 IMPLANT
CELLS DAT CNTRL 66122 CELL SVR (MISCELLANEOUS) IMPLANT
CLOTH BEACON ORANGE TIMEOUT ST (SAFETY) ×4 IMPLANT
DECANTER SPIKE VIAL GLASS SM (MISCELLANEOUS) IMPLANT
DISSECTOR SPONGE CHERRY (GAUZE/BANDAGES/DRESSINGS) IMPLANT
DRAPE WARM FLUID 44X44 (DRAPE) IMPLANT
DRSG COVADERM PLUS 2X2 (GAUZE/BANDAGES/DRESSINGS) ×8 IMPLANT
DRSG OPSITE POSTOP 3X4 (GAUZE/BANDAGES/DRESSINGS) ×4 IMPLANT
DRSG TELFA 3X8 NADH (GAUZE/BANDAGES/DRESSINGS) IMPLANT
FILTER SMOKE EVAC LAPAROSHD (FILTER) ×4 IMPLANT
GLOVE BIO SURGEON STRL SZ7.5 (GLOVE) ×4 IMPLANT
GLOVE SURG SS PI 7.0 STRL IVOR (GLOVE) ×12 IMPLANT
GOWN STRL REUS W/TWL LRG LVL3 (GOWN DISPOSABLE) ×12 IMPLANT
LIQUID BAND (GAUZE/BANDAGES/DRESSINGS) ×4 IMPLANT
NDL SAFETY ECLIPSE 18X1.5 (NEEDLE) ×2 IMPLANT
NEEDLE HYPO 18GX1.5 SHARP (NEEDLE) ×2
NEEDLE HYPO 22GX1.5 SAFETY (NEEDLE) ×4 IMPLANT
NS IRRIG 1000ML POUR BTL (IV SOLUTION) ×4 IMPLANT
PACK ABDOMINAL GYN (CUSTOM PROCEDURE TRAY) ×4 IMPLANT
PACK LAPAROSCOPY BASIN (CUSTOM PROCEDURE TRAY) ×4 IMPLANT
PAD OB MATERNITY 4.3X12.25 (PERSONAL CARE ITEMS) ×4 IMPLANT
PAD POSITIONER PINK NONSTERILE (MISCELLANEOUS) ×4 IMPLANT
POUCH SPECIMEN RETRIEVAL 10MM (ENDOMECHANICALS) ×4 IMPLANT
PROTECTOR NERVE ULNAR (MISCELLANEOUS) ×8 IMPLANT
RETRACTOR WND ALEXIS 25 LRG (MISCELLANEOUS) IMPLANT
RTRCTR WOUND ALEXIS 18CM MED (MISCELLANEOUS)
RTRCTR WOUND ALEXIS 25CM LRG (MISCELLANEOUS)
SET IRRIG TUBING LAPAROSCOPIC (IRRIGATION / IRRIGATOR) ×4 IMPLANT
SHEARS HARMONIC ACE PLUS 36CM (ENDOMECHANICALS) ×4 IMPLANT
SPONGE GAUZE 4X4 12PLY STER LF (GAUZE/BANDAGES/DRESSINGS) IMPLANT
SPONGE LAP 18X18 X RAY DECT (DISPOSABLE) ×8 IMPLANT
SPONGE LAP 4X18 X RAY DECT (DISPOSABLE) ×4 IMPLANT
STAPLER VISISTAT 35W (STAPLE) ×4 IMPLANT
SUT CHROMIC 3 0 TIES (SUTURE) IMPLANT
SUT PLAIN 4 0 FS 2 27 (SUTURE) ×4 IMPLANT
SUT SILK 3 0 SH 30 (SUTURE) IMPLANT
SUT VIC AB 0 CT1 18XCR BRD8 (SUTURE) IMPLANT
SUT VIC AB 0 CT1 27 (SUTURE)
SUT VIC AB 0 CT1 27XBRD ANBCTR (SUTURE) IMPLANT
SUT VIC AB 0 CT1 8-18 (SUTURE)
SUT VIC AB 2-0 CT1 27 (SUTURE) ×4
SUT VIC AB 2-0 CT1 TAPERPNT 27 (SUTURE) ×4 IMPLANT
SUT VIC AB 3-0 PS1 18 (SUTURE)
SUT VIC AB 3-0 PS1 18X BRD (SUTURE) IMPLANT
SUT VICRYL 0 TIES 12 18 (SUTURE) IMPLANT
SUT VICRYL 0 UR6 27IN ABS (SUTURE) ×4 IMPLANT
SUT VICRYL 3 0 BR 18  UND (SUTURE) ×2
SUT VICRYL 3 0 BR 18 UND (SUTURE) ×2 IMPLANT
SYR CONTROL 10ML LL (SYRINGE) ×4 IMPLANT
TOWEL OR 17X24 6PK STRL BLUE (TOWEL DISPOSABLE) ×8 IMPLANT
TRAY FOLEY CATH SILVER 14FR (SET/KITS/TRAYS/PACK) ×4 IMPLANT
TROCAR XCEL NON-BLD 11X100MML (ENDOMECHANICALS) ×4 IMPLANT
TROCAR XCEL NON-BLD 5MMX100MML (ENDOMECHANICALS) ×8 IMPLANT
WARMER LAPAROSCOPE (MISCELLANEOUS) ×4 IMPLANT
WATER STERILE IRR 1000ML POUR (IV SOLUTION) ×4 IMPLANT

## 2014-10-25 NOTE — Anesthesia Procedure Notes (Signed)
Procedure Name: Intubation Date/Time: 10/25/2014 1:39 PM Performed by: Georgeanne Nim Pre-anesthesia Checklist: Patient identified, Timeout performed, Emergency Drugs available, Suction available and Patient being monitored Patient Re-evaluated:Patient Re-evaluated prior to inductionOxygen Delivery Method: Circle system utilized Preoxygenation: Pre-oxygenation with 100% oxygen Intubation Type: IV induction Ventilation: Mask ventilation without difficulty Laryngoscope Size: Mac and 3 Grade View: Grade II Tube type: Oral Tube size: 7.0 mm Number of attempts: 2 Airway Equipment and Method: Stylet Placement Confirmation: ETT inserted through vocal cords under direct vision,  positive ETCO2,  CO2 detector and breath sounds checked- equal and bilateral (ETT place with postive EtCO2 but unable to advance past 18;removed and reinserted easily to 21) Secured at: 21 cm Tube secured with: Tape Dental Injury: Teeth and Oropharynx as per pre-operative assessment

## 2014-10-25 NOTE — Anesthesia Postprocedure Evaluation (Signed)
  Anesthesia Post-op Note  Patient: Tammie [name]  Procedure(s) Performed: Procedure(s): LAPAROSCOPIC UNILATERAL SALPINGO OOPHORECTOMY (Right)  Patient Location: PACU  Anesthesia Type:General  Level of Consciousness: awake, alert  and oriented  Airway and Oxygen Therapy: Patient Spontanous Breathing  Post-op Pain: none  Post-op Assessment: Post-op Vital signs reviewed, Patient's Cardiovascular Status Stable, Respiratory Function Stable, Patent Airway, No signs of Nausea or vomiting and Pain level controlled              Post-op Vital Signs: Reviewed and stable  Last Vitals:  Filed Vitals:   10/25/14 1515  BP: 145/83  Pulse: 75  Temp:   Resp: 16    Complications: No apparent anesthesia complications

## 2014-10-25 NOTE — Discharge Instructions (Signed)
Postoperative Instructions Laparoscopy  Dr. Phineas Real and the nursing staff have discussed postoperative instructions with you.  If you have any questions please ask them before you leave the hospital, or call Dr Elisabeth Most office at [phone].    We would like to emphasize the following instructions:   ? Call the office to make your follow-up appointment as recommended by Dr Phineas Real (usually 1-2 weeks).  ? You were given a prescription, or one was ordered for you at the pharmacy you designated.  Get that prescription filled and take the medication according to instructions.  ? You may eat a regular diet, but slowly until you start having bowel movements.  ? Drink plenty of water daily.  ? Nothing in the vagina (intercourse, douching, objects of any kind) for 2 weeks.  When reinitiating intercourse, if it is uncomfortable, stop and make an appointment with Dr Phineas Real to be evaluated.  ? No driving for several days until the anesthesia has worn off and you are not having significant pain.  Car rides (short) are ok, as long as you are not having significant pain, but no traveling out of town until your postoperative appointment.  ? You may shower, but no baths for two weeks.  Walking up and down stairs is ok.  No heavy lifting, prolonged standing, repeated bending or any working out until your first  postoperative appointment.  ? Rest frequently, listen to your body and do not push yourself and overdo it.  ? Call if:  o Your pain medication does not seem strong enough. o Worsening pain or abdominal bloating o Persistent nausea or vomiting o Difficulty with urination or bowel movements. o Temperature of 101 degrees or higher. o Heavy vaginal bleeding.  If your period is due, you may use tampons.   o Incisions become red, tender or begin to drain. You have any questions or concernsDISCHARGE INSTRUCTIONS: Laparoscopy  The following instructions have been prepared to help you care  for yourself upon your return home today.  Wound care:  Do not get the incision wet for the first 24 hours. The incision should be kept clean and dry.  The Band-Aids or dressings may be removed the day after surgery.  Should the incision become sore, red, and swollen after the first week, check with your doctor.  Personal hygiene:  Shower the day after your procedure.  Activity and limitations:  Do NOT drive or operate any equipment today.  Do NOT lift anything more than 15 pounds for 2-3 weeks after surgery.  Do NOT rest in bed all day.  Walking is encouraged. Walk each day, starting slowly with 5-minute walks 3 or 4 times a day. Slowly increase the length of your walks.  Walk up and down stairs slowly.  Do NOT do strenuous activities, such as golfing, playing tennis, bowling, running, biking, weight lifting, gardening, mowing, or vacuuming for 2-4 weeks. Ask your doctor when it is okay to start.  Diet: Eat a light meal as desired this evening. You may resume your usual diet tomorrow.  Return to work: This is dependent on the type of work you do. For the most part you can return to a desk job within a week of surgery. If you are more active at work, please discuss this with your doctor.  What to expect after your surgery: You may have a slight burning sensation when you urinate on the first day. You may have a very small amount of blood in the urine. Expect to have  a small amount of vaginal discharge/light bleeding for 1-2 weeks. It is not unusual to have abdominal soreness and bruising for up to 2 weeks. You may be tired and need more rest for about 1 week. You may experience shoulder pain for 24-72 hours. Lying flat in bed may relieve it.  Call your doctor for any of the following:  Develop a fever of 100.4 or greater  Inability to urinate 6 hours after discharge from hospital  Severe pain not relieved by pain medications  Persistent of heavy bleeding at incision site   Redness or swelling around incision site after a week  Increasing nausea or vomiting   Encourage increase of fluids, like water next 48hours. Can use a heating pad on abdomen and upper shoulders/chest.  Patient Signature________________________________________ o Nurse Signature_________________________________________

## 2014-10-25 NOTE — Anesthesia Preprocedure Evaluation (Addendum)
Anesthesia Evaluation  Patient identified by MRN, date of birth, ID band Patient awake    Reviewed: Allergy & Precautions, NPO status , Patient's Chart, lab work & pertinent test results  History of Anesthesia Complications (+) DIFFICULT IV STICK / SPECIAL LINE and history of anesthetic complications (Has a PICC in place for this procedure)  Airway Mallampati: II  TM Distance: >3 FB Neck ROM: Full    Dental no notable dental hx. (+) Dental Advisory Given   Pulmonary neg pulmonary ROS,  breath sounds clear to auscultation  Pulmonary exam normal       Cardiovascular hypertension, Normal cardiovascular exam+ Valvular Problems/Murmurs Rhythm:Regular Rate:Normal  Echo in 2013 w/ EF of 35-40% and reduced LV function, she reports since then she has had no problems, will go to the gym several times a week w/ METs >4, no peripheral edema or orthopnea, no DOE, no limitations in her physical activity   Neuro/Psych  Headaches, PSYCHIATRIC DISORDERS Anxiety Depression    GI/Hepatic Neg liver ROS, GERD-  Medicated and Controlled,  Endo/Other  negative endocrine ROS  Renal/GU negative Renal ROS  negative genitourinary   Musculoskeletal  (+) Arthritis -,   Abdominal   Peds negative pediatric ROS (+)  Hematology negative hematology ROS (+)   Anesthesia Other Findings   Reproductive/Obstetrics negative OB ROS                            Anesthesia Physical Anesthesia Plan  ASA: III  Anesthesia Plan: General   Post-op Pain Management:    Induction: Intravenous  Airway Management Planned: Oral ETT  Additional Equipment:   Intra-op Plan:   Post-operative Plan: Extubation in OR  Informed Consent: I have reviewed the patients History and Physical, chart, labs and discussed the procedure including the risks, benefits and alternatives for the proposed anesthesia with the patient or authorized  representative who has indicated his/her understanding and acceptance.   Dental advisory given  Plan Discussed with: CRNA  Anesthesia Plan Comments: (Discussed with Dr. Phineas Real that she should get cardiology follow up as an outpatient. She has excellent functional status, no change in state of health, no signs of worsening cardiac function, and a repeat echo prior to this procedure would not change my management therefore the surgery can proceed however since she has not had any follow up since this echo in 2013, I do feel like it should at least be reevaluated. )       Anesthesia Quick Evaluation

## 2014-10-25 NOTE — H&P (Signed)
  The patient was examined.  I reviewed the proposed surgery and consent form with the patient.  The dictated history and physical is current and accurate and all questions were answered. The patient is ready to proceed with surgery and has a realistic understanding and expectation for the outcome.   Anastasio Auerbach MD, 12:36 PM 10/25/2014

## 2014-10-25 NOTE — Transfer of Care (Signed)
Immediate Anesthesia Transfer of Care Note  Patient: Tammie [name]  Procedure(s) Performed: Procedure(s): LAPAROSCOPIC UNILATERAL SALPINGO OOPHORECTOMY (Right)  Patient Location: PACU  Anesthesia Type:General  Level of Consciousness: awake, alert , oriented and patient cooperative  Airway & Oxygen Therapy: Patient Spontanous Breathing and Patient connected to nasal cannula oxygen  Post-op Assessment: Report given to RN and Post -op Vital signs reviewed and stable  Post vital signs: Reviewed and stable  Last Vitals:  Filed Vitals:   10/25/14 1116  BP: 138/93  Pulse: 74  Temp: 36.8 C  Resp: 18    Complications: No apparent anesthesia complications

## 2014-10-25 NOTE — Op Note (Signed)
TAQUILLA DOWNUM [birth year] [id number]   Post Operative Note   Date of surgery:  10/25/2014  Pre Op Dx:  Right ovarian cystic mass, pelvic pain  Post Op Dx:  Right ovarian cystic mass, pelvic pain, vaginal cuff surgical staples  Procedure:  Laparoscopic right salpingo-oophorectomy, excision vaginal cuff staples  Surgeon:  Anastasio Auerbach  Assistant:  Uvaldo Rising  Anesthesia:  General  EBL:  minimal  Complications:  None  Specimen:  #1 right ovary with attached fallopian tube intact #2 surgical staples gross only to pathology  Findings: EUA:  External BUS vagina normal. Bimanual with 5-6 cm right cuff cystic mass.   Operative:  6-7 cm cystic mass right ovary with smooth ovarian capsule. Normal-appearing attached fallopian tube segment. Surgically absent uterus, left ovary and left fallopian tube.  No significant pelvic adhesions or upper abdominal adhesions. Cluster of surgical staples superficially adherent to the mid upper vaginal cuff, excised. Liver smooth. Gallbladder not visualized. Appendix free and mobile.  Procedure:  The patient was taken to the operating room, was placed in the low dorsal lithotomy position, underwent general anesthesia, received an abdominal preparation with DuraPrep and a vaginal/perineal preparation with Betadine solution. A Foley catheter was placed in sterile technique. An EUA was performed and a sponge stick was placed within the vagina for manipulation during the case. The timeout was performed by the surgical team. The patient was draped in the usual fashion. The patient's stomach was emptied with an oral gastric tube and a small skin incision was made in the left upper quadrant several fingerbreadths below the rib cage at the midclavicular line. Using the 5 mm direct entry Optiview type trocar the abdomen was directly entered under direct visualization without difficulty and subsequently insufflated. Inspection of the anterior abdominal wall showed  no adhesions and a vertical infraumbilical incision was made and the 10 mm direct entry trocar was placed under direct visualization without difficulty. A right suprapubic 5 mm port was also placed under direct visualization after transillumination for the vessels without difficulty. An opening cell washing was then taken and sent to cytology. The right adnexa was elevated and found to be free of adhesions. The right ureter was identified and traced along its course and found to be away from the operative field. Using the harmonic scalpel the infundibulopelvic ligament and vessels were transected and the specimen was freed using the harmonic scalpel to excise the remaining peritoneal attachments continually identifying the ureter during the dissection/transection. The specimen was ultimately freed and the 10 mm laparoscope was replaced with a 5 mm laparoscope through the left port and an Endopouch placed through the umbilical port and the specimen was removed intact after extending the infraumbilical incision. The abdomen was reinsufflated, copiously irrigated and reinspection of the vascular pedicles showed adequate hemostasis. There was a small cluster of staples at the mid vaginal cuff and due to the patient complaining of some discomfort with intercourse it was felt prudent to remove these. They were grasped and elevated and the superficial underlying peritoneum transected sharply and the staples were removed. Bipolar cautery was applied for hemostasis.  Tha pelvis again was irrigated showing adequate hemostasis and the right and left 5 mm ports were removed under direct visualization showing adequate hemostasis.  The 10 mm port was then removed and the fascia was identified and closed with a running 0 Vicryl suture. All skin incisions were injected using 0.25 % Marcaine, 10 cc total. The infraumbilical skin was reapproximated using a 3-0 plain suture  in a running subcuticular stitch and then LiquiBand was  applied to the skin. The two 5 mm ports were closed using LiquiBand skin adhesive.  The sponge stick was removed from the vagina, the patient received intraoperative Toradol, she was awakened without difficulty and taken to the recovery room in good condition having tolerated the procedure well.     Anastasio Auerbach MD, 3:03 PM 10/25/2014

## 2014-10-26 ENCOUNTER — Telehealth: Payer: Self-pay | Admitting: Gynecology

## 2014-10-26 ENCOUNTER — Encounter (HOSPITAL_COMMUNITY): Payer: Self-pay | Admitting: Gynecology

## 2014-10-26 NOTE — Telephone Encounter (Signed)
Call patient in follow up of her surgery yesterday. 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 I also discussed with her the echocardiogram that was done in 2013 as a prechemotherapy baseline which showed a 35-40% ejection fraction. Diffuse hypokinesis. Patient relates that no one had discussed this with her previously. They had talked about doing a post chemotherapy echocardiogram but never did this. I recommended that we make her an appointment to see a cardiologist in follow up to see what they feel further needs to be done and she agrees with this and we'll help her set up this appointment.

## 2014-10-27 ENCOUNTER — Telehealth: Payer: Self-pay | Admitting: *Deleted

## 2014-10-27 DIAGNOSIS — R931 Abnormal findings on diagnostic imaging of heart and coronary circulation: Secondary | ICD-10-CM

## 2014-10-27 NOTE — Telephone Encounter (Signed)
Error appointment is on 11/04/14 with Dr.Taylor, pt aware of this

## 2014-10-27 NOTE — Telephone Encounter (Signed)
Appointment will be scheduled. (see telephone encounter on 10/27/14)

## 2014-10-27 NOTE — Telephone Encounter (Signed)
Appointment on 11/14/14 @ 11:00am with Dr. Lovena Le at [facility] [address] location, left message for pt to call.

## 2014-10-27 NOTE — Telephone Encounter (Signed)
-----   Message from Anastasio Auerbach, MD sent at 10/26/2014 11:03 AM EDT ----- I routed you a telephone encounter in reference to this patient. She needs an appointment to see a cardiologist within the next several weeks and she is moving out of country in August in reference to her 2013 echocardiogram showing a decreased ejection fraction of her ventricle. The report is in Epic for them to look at.

## 2014-11-04 ENCOUNTER — Ambulatory Visit (INDEPENDENT_AMBULATORY_CARE_PROVIDER_SITE_OTHER): Payer: 59 | Admitting: Internal Medicine

## 2014-11-04 ENCOUNTER — Encounter: Payer: Self-pay | Admitting: Internal Medicine

## 2014-11-04 VITALS — BP 130/92 | HR 74 | Ht 67.0 in | Wt 176.8 lb

## 2014-11-04 DIAGNOSIS — I519 Heart disease, unspecified: Secondary | ICD-10-CM

## 2014-11-04 DIAGNOSIS — I5189 Other ill-defined heart diseases: Secondary | ICD-10-CM

## 2014-11-04 DIAGNOSIS — R931 Abnormal findings on diagnostic imaging of heart and coronary circulation: Secondary | ICD-10-CM

## 2014-11-04 NOTE — Patient Instructions (Signed)
Medication Instructions:  Your physician recommends that you continue on your current medications as directed. Please refer to the Current Medication list given to you today.  Labwork: None ordered  Testing/Procedures: Your physician has requested that you have an echocardiogram. Echocardiography is a painless test that uses sound waves to create images of your heart. It provides your doctor with information about the size and shape of your heart and how well your heart's chambers and valves are working. This procedure takes approximately one hour. There are no restrictions for this procedure.  Follow-Up: To be determined after echo  Any Other Special Instructions Will Be Listed Below (If Applicable). Thank you for choosing [facility]!!
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
## 2014-11-04 NOTE — Assessment & Plan Note (Signed)
3 years ago her LV dysfunction was 35-40%. She is not on any CHF meds and she has class 2 heart failure symptoms. We will repeat her 2D echo and determine the treatment options based on the echo results. If she has LV dysfunction, she will need to start a beta blocker and an ACE/ARB.

## 2014-11-09 ENCOUNTER — Other Ambulatory Visit: Payer: Self-pay

## 2014-11-09 ENCOUNTER — Ambulatory Visit (HOSPITAL_COMMUNITY): Payer: 59 | Attending: Cardiology

## 2014-11-09 DIAGNOSIS — R931 Abnormal findings on diagnostic imaging of heart and coronary circulation: Secondary | ICD-10-CM | POA: Diagnosis present

## 2014-11-10 ENCOUNTER — Encounter: Payer: Self-pay | Admitting: Gynecology

## 2014-11-10 ENCOUNTER — Ambulatory Visit (INDEPENDENT_AMBULATORY_CARE_PROVIDER_SITE_OTHER): Payer: 59 | Admitting: Gynecology

## 2014-11-10 VITALS — BP 120/76

## 2014-11-10 DIAGNOSIS — Z9889 Other specified postprocedural states: Secondary | ICD-10-CM

## 2014-11-10 NOTE — Patient Instructions (Signed)
Follow up routinely when you're due for your annual exam. Follow up with the cardiologist as they recommend.

## 2014-11-10 NOTE — Progress Notes (Signed)
Tammie [name] [birth year] [id number]        44 y.o.  G1P1 presents for her postoperative visit status post laparoscopic right salpingo-oophorectomy for benign serous cystadenoma. Has done well without complaints.  Past medical history,surgical history, problem list, medications, allergies, family history and social history were all reviewed and documented in the EPIC chart.  Directed ROS with pertinent positives and negatives documented in the history of present illness/assessment and plan.  Exam: Kim assistant Filed Vitals:   11/10/14 1225  BP: 120/76   General appearance:  Normal Abdomen soft nontender without masses guarding rebound. Incisions healed nicely. Bimanual exam without masses or tenderness.  Assessment/Plan:  44 y.o. G1P1 was normal postoperative visit status post laparoscopic right salpingo-oophorectomy. She is doing well from a symptom standpoint recognizing now she is status post TVH, left salpingo-oophorectomy now right salpingo-oophorectomy. Without significant hot flashes or night sweats.  Patient will slowly resume all normal activities and follow up when she is due for her annual exam.  She is being seen by cardiology now due to her left ventricular dysfunction noted on a pre-chemotherapy echocardiogram in 2013.    Anastasio Auerbach MD, 12:42 PM 11/10/2014

## 2014-11-12 ENCOUNTER — Telehealth: Payer: Self-pay | Admitting: *Deleted

## 2014-11-12 MED ORDER — CARVEDILOL 3.125 MG PO TABS: 3.1250 mg | ORAL_TABLET | Freq: Two times a day (BID) | ORAL | Status: DC

## 2014-11-12 MED ORDER — LOSARTAN POTASSIUM 25 MG PO TABS: 25.0000 mg | ORAL_TABLET | Freq: Every day | ORAL | Status: DC

## 2014-11-12 NOTE — Telephone Encounter (Signed)
-----   Message from Evans Lance, MD sent at 11/10/2014  9:53 PM EDT ----- Her heart pumping function is not severely reduced. No indication for an ICD. She needs to take her medications and refrain from salty foods. GT ----- Message -----    From: [facility]    Sent: 11/09/2014  10:04 AM      To: Evans Lance, MD

## 2014-11-12 NOTE — Telephone Encounter (Signed)
Dr Lovena Le recommended starting  Losartan 25mg  and Carvedilol 3.125 mg bid  Patient is aware.  AS she is going to [place] for 12 months Dr Lovena Le says okay to fill as a vacation Rx for 56 months at a  Time  Will send into her pharmacy that way.  She says she has to pay more but it is better than trying to fill in [place]

## 2014-11-15 ENCOUNTER — Telehealth: Payer: Self-pay | Admitting: Internal Medicine

## 2014-11-15 NOTE — Telephone Encounter (Addendum)
Pt called with questions about medication as she just wanted to know and s/s to look for and when to call. Pt stated she has not started taking the new medications as she is going to the Pharmacy today to pick up. Educated pt on what to look for with the medications and that she should check her BP and HR at home periodically just to know her baseline incase there are big changes.  Pt educated that she can call our office anytime and if she needs to speak to someone even after hours she can speak to the on -call person.  Pt verbalized and no additional questions at this time.

## 2014-11-15 NOTE — Telephone Encounter (Signed)
New Message        Pt calling stating that she has medication questions and needs some clarification. Please call back and advise.

## 2014-11-19 ENCOUNTER — Telehealth: Payer: Self-pay | Admitting: Internal Medicine

## 2014-11-19 NOTE — Telephone Encounter (Signed)
Ok to fill for the year as she will be out of the country

## 2014-11-19 NOTE — Telephone Encounter (Signed)
New message    Pharmacy states pt will be out of the country for 366 days and would like to see if she could get all her medications for the year and a couple extra days. Pharmacy needs approval. Prescriptions that are needed: losartan, singulair and coreg.   Please call to discuss

## 2014-11-24 ENCOUNTER — Telehealth: Payer: Self-pay | Admitting: *Deleted

## 2014-11-24 NOTE — Telephone Encounter (Signed)
Unfortunately, I am unable to prescribe more than 1 year at a time.

## 2014-11-24 NOTE — Telephone Encounter (Signed)
Received fax from [name] requesting Rx of montelukast with refills greater than 1 year due to upcoming travel for job?Tammie [name]  Please advise.

## 2014-11-25 MED ORDER — MONTELUKAST SODIUM 10 MG PO TABS: 10.0000 mg | ORAL_TABLET | Freq: Every day | ORAL | Status: DC

## 2014-11-25 NOTE — Telephone Encounter (Signed)
Pt states she only needs a year worth of refills.  Please advise.  Last filled:  09/28/14 Amt: 90, 1

## 2014-11-25 NOTE — Telephone Encounter (Signed)
Left a message for call back.  

## 2014-12-10 ENCOUNTER — Ambulatory Visit: Payer: Self-pay | Admitting: Internal Medicine

## 2015-02-27 ENCOUNTER — Telehealth: Payer: Self-pay | Admitting: Medical

## 2015-02-27 DIAGNOSIS — D72819 Decreased white blood cell count, unspecified: Secondary | ICD-10-CM

## 2015-02-27 NOTE — Telephone Encounter (Signed)
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

## 2015-02-28 ENCOUNTER — Telehealth: Payer: Self-pay

## 2015-02-28 NOTE — Telephone Encounter (Signed)
Attempted to reach pt and VM is not set up.

## 2015-02-28 NOTE — Telephone Encounter (Signed)
Oncology called about referral they need more information on the patients condition and more recent labs. They asked that you call the office to discuss the patients condition so they can move forward with a care plan for the patient

## 2015-02-28 NOTE — Telephone Encounter (Signed)
I have attempted to reach this pt 3 times today and VM is not set up. Oncology will need more information on pt before further workup will be done. Please advise.

## 2015-02-28 NOTE — Telephone Encounter (Signed)
See other phone note

## 2015-02-28 NOTE — Telephone Encounter (Signed)
My Chart message sent

## 2015-02-28 NOTE — Telephone Encounter (Signed)
On chart review it looks like pt was seen by Dr. Marin Olp. I saw that he had evaluated this leukopenia before. I was not aware of this. I did review his note. So I will cancel the referral. But please advise pt to get regular labs/physical each year and will follow her wbc.

## 2015-03-01 ENCOUNTER — Telehealth: Payer: Self-pay | Admitting: *Deleted

## 2015-03-01 NOTE — Telephone Encounter (Signed)
Patient signed ROI received via fax from [employer]. Forwarded to [place] to scan/email to medical records. JG//CMA

## 2015-03-01 NOTE — Telephone Encounter (Signed)
Will mail letter to pt since VM is not set up.

## 2015-05-05 ENCOUNTER — Other Ambulatory Visit: Payer: Self-pay

## 2015-05-05 DIAGNOSIS — Z1231 Encounter for screening mammogram for malignant neoplasm of breast: Secondary | ICD-10-CM

## 2015-05-17 ENCOUNTER — Ambulatory Visit
Admission: RE | Admit: 2015-05-17 | Discharge: 2015-05-17 | Disposition: A | Discharge status: Home / Self Care | Payer: Self-pay | Source: Ambulatory Visit

## 2015-05-17 ENCOUNTER — Other Ambulatory Visit: Payer: Self-pay

## 2015-05-17 DIAGNOSIS — Z1231 Encounter for screening mammogram for malignant neoplasm of breast: Secondary | ICD-10-CM

## 2015-08-09 ENCOUNTER — Telehealth: Payer: Self-pay | Admitting: Internal Medicine

## 2015-08-09 NOTE — Telephone Encounter (Signed)
New Message:   Pt is in [place] to be admitted to the hospital there. She wants to send a CD for Dr Lovena Le to review.Pt does not feel comfortable with what they are saying,she says there is a language barrier there. Any questions please call here at [phone]. She is sending the CD asap,please be on the look out for it.

## 2015-08-09 NOTE — Telephone Encounter (Signed)
I will forward to Claiborne Billings so she will be aware pt is sending CD for Dr Lovena Le to review.

## 2015-08-19 NOTE — Telephone Encounter (Signed)
CD has been reviewed by Dr Lovena Le EKG and echo both look good.   I have tried to reach patient and I am unable to call to the number she gave me If she call back please have me paged over-head

## 2015-08-24 NOTE — Telephone Encounter (Signed)
Finally was able to speak with patient.  Let her know Dr Lovena Le reviewed her echo and EKG's both which looked good.  She will call once she gets back to the states and I will make an appontment for her to come in.  She went to the ER for CP and due to language barrier she did not feel comfortable and had the CD sent here to be looked at by Dr Lovena Le.

## 2015-10-21 ENCOUNTER — Encounter: Payer: Self-pay | Admitting: Genetic Counselor

## 2015-12-13 ENCOUNTER — Telehealth: Payer: Self-pay | Admitting: Internal Medicine

## 2015-12-13 NOTE — Telephone Encounter (Signed)
error 

## 2015-12-20 ENCOUNTER — Encounter: Payer: Self-pay | Admitting: Internal Medicine

## 2015-12-21 ENCOUNTER — Encounter: Payer: Self-pay | Admitting: Internal Medicine

## 2015-12-21 ENCOUNTER — Other Ambulatory Visit: Payer: Self-pay | Admitting: *Deleted

## 2015-12-21 ENCOUNTER — Ambulatory Visit (INDEPENDENT_AMBULATORY_CARE_PROVIDER_SITE_OTHER): Payer: BLUE CROSS/BLUE SHIELD | Admitting: Internal Medicine

## 2015-12-21 ENCOUNTER — Ambulatory Visit (HOSPITAL_COMMUNITY): Payer: BLUE CROSS/BLUE SHIELD | Attending: Cardiology

## 2015-12-21 ENCOUNTER — Other Ambulatory Visit: Payer: Self-pay

## 2015-12-21 VITALS — BP 120/86 | HR 61 | Ht 67.0 in | Wt 182.0 lb

## 2015-12-21 DIAGNOSIS — I119 Hypertensive heart disease without heart failure: Secondary | ICD-10-CM | POA: Insufficient documentation

## 2015-12-21 DIAGNOSIS — I519 Heart disease, unspecified: Secondary | ICD-10-CM | POA: Diagnosis not present

## 2015-12-21 DIAGNOSIS — I5022 Chronic systolic (congestive) heart failure: Secondary | ICD-10-CM
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
## 2015-12-21 NOTE — Patient Instructions (Signed)

## 2015-12-27 ENCOUNTER — Telehealth: Payer: Self-pay | Admitting: Internal Medicine

## 2015-12-27 NOTE — Telephone Encounter (Signed)
Returned call to patient and her voicemail has not been set up yet.

## 2015-12-27 NOTE — Telephone Encounter (Signed)
Ms. Tammie [name] is returning a cal about her test results. Please call   Thanks

## 2015-12-28 NOTE — Telephone Encounter (Signed)
Called patient and let her know results of echo

## 2015-12-30 ENCOUNTER — Other Ambulatory Visit: Payer: Self-pay | Admitting: Internal Medicine

## 2016-01-06 NOTE — Telephone Encounter (Signed)
Please close Encounter °

## 2016-01-13 MED FILL — LOSARTAN POTASSIUM 25 MG TA: 25 | 90 days supply | Qty: 90 | Fill #0 | Status: TO

## 2016-01-13 MED FILL — CARVEDILOL 3.125 MG TABLET: 3.125 | 90 days supply | Qty: 180 | Fill #0 | Status: TO

## 2016-05-10 IMAGING — XA IR FLUORO GUIDE CV LINE*L*
1 series · 1 of 1 positions shown · non-contrast
Comparison: none

CLINICAL DATA: PELVIC MASS, POOR PERIPHERAL VEINS, PREOPERATIVE
ACCESS INSERTION

[Series 300: ir fluoro guide cv line left · 1 of 1 slices shown]
[im 1/1]
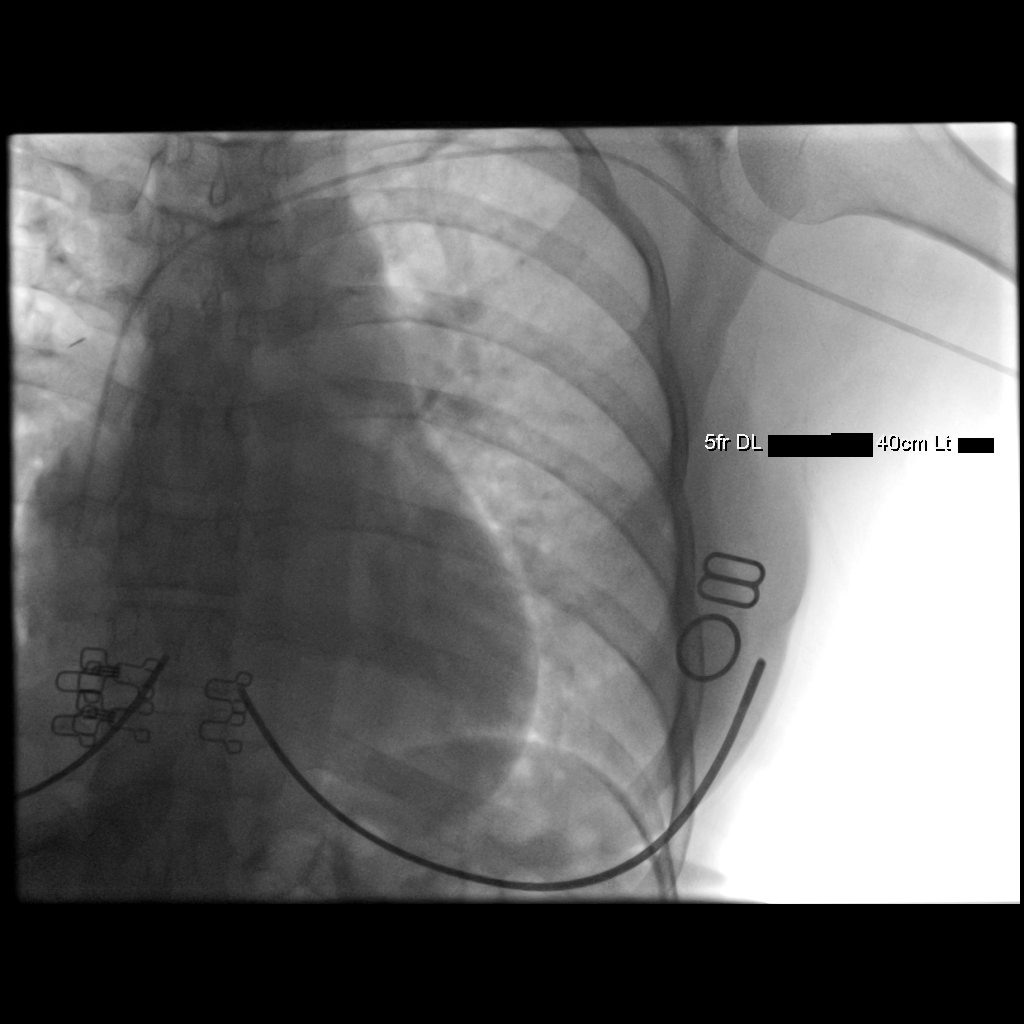

[1 of 1 positions shown; findings below may reference images not displayed]

EXAM:
LEFT UPPER EXTREMITY DOUBLE-LUMEN POWER PICC LINE PLACEMENT WITH
ULTRASOUND AND FLUOROSCOPIC GUIDANCE

FLUOROSCOPY TIME:  54 seconds

PROCEDURE:
The patient was advised of the possible risks and complications and
agreed to undergo the procedure. The patient was then brought to the
angiographic suite for the procedure.

The left arm was prepped with chlorhexidine, draped in the usual
sterile fashion using maximum barrier technique (cap and mask,
sterile gown, sterile gloves, large sterile sheet, hand hygiene and
cutaneous antisepsis) and infiltrated locally with 1% Lidocaine.

Ultrasound demonstrated patency of the left brachial vein, and this
was documented with an image. Under real-time ultrasound guidance,
this vein was accessed with a 21 gauge micropuncture needle and
image documentation was performed. A [DATE] wire was introduced in to
the vein. Over this, a 5 French double lumen power PICC was advanced
to the lower SVC/right atrial junction. Fluoroscopy during the
procedure and fluoro spot radiograph confirms appropriate catheter
position. The catheter was flushed and covered with a sterile
dressing.

Catheter length: 40

Complications: None
IMPRESSION: Successful left arm power PICC line placement with ultrasound and
fluoroscopic guidance. The catheter is ready for use.

## 2016-05-18 ENCOUNTER — Encounter: Payer: Self-pay | Admitting: Physician Assistant

## 2016-05-18 ENCOUNTER — Ambulatory Visit (INDEPENDENT_AMBULATORY_CARE_PROVIDER_SITE_OTHER): Payer: BLUE CROSS/BLUE SHIELD | Admitting: Physician Assistant

## 2016-05-18 VITALS — BP 130/88 | HR 99 | Temp 99.0°F | Resp 16 | Ht 67.0 in | Wt 186.8 lb

## 2016-05-18 DIAGNOSIS — J019 Acute sinusitis, unspecified: Secondary | ICD-10-CM

## 2016-05-18 MED ORDER — FLUTICASONE PROPIONATE 50 MCG/ACT NA SUSP: 2.0000 | Freq: Every day | NASAL | 6 refills | Status: AC

## 2016-05-18 MED ORDER — AMOXICILLIN-POT CLAVULANATE 875-125 MG PO TABS: 1.0000 | ORAL_TABLET | Freq: Two times a day (BID) | ORAL | 0 refills | Status: DC

## 2016-05-18 MED FILL — LOSARTAN POTASSIUM 25 MG TA: 25 | 90 days supply | Qty: 90 | Fill #1 | Status: TO

## 2016-05-18 MED FILL — CARVEDILOL 3.125 MG TABLET: 3.125 | 90 days supply | Qty: 180 | Fill #1 | Status: TO

## 2016-05-18 MED FILL — FLUTICASONE PROP 50 MCG SPR: 50 | 30 days supply | Qty: 16 | Fill #0

## 2016-05-18 MED FILL — TAMOXIFEN 20 MG TABLET: 20 | 90 days supply | Qty: 90 | Fill #0

## 2016-05-18 MED FILL — AMOX-CLAV 875-125 MG TABLET: 875-125 | 10 days supply | Qty: 20 | Fill #0
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
## 2016-05-18 NOTE — Progress Notes (Signed)
Pre visit review using our clinic review tool, if applicable. No additional management support is needed unless otherwise documented below in the visit note. 

## 2016-05-18 NOTE — Patient Instructions (Addendum)
It was great meeting you today.  Take Augmentin as prescribed. Use Flonase as directed. Push fluids. Try to stay well rested.   Please follow-up with Melissa if your symptoms do not improve. Please seek medical attention if you develop shortness of breath, worsening fevers, or severe headache.   Sinusitis, Adult Sinusitis is soreness and inflammation of your sinuses. Sinuses are hollow spaces in the bones around your face. Your sinuses are located:  Around your eyes.  In the middle of your forehead.  Behind your nose.  In your cheekbones. Your sinuses and nasal passages are lined with a stringy fluid (mucus). Mucus normally drains out of your sinuses. When your nasal tissues become inflamed or swollen, the mucus can become trapped or blocked so air cannot flow through your sinuses. This allows bacteria, viruses, and funguses to grow, which leads to infection. Sinusitis can develop quickly and last for 7?10 days (acute) or for more than 12 weeks (chronic). Sinusitis often develops after a cold. What are the causes? This condition is caused by anything that creates swelling in the sinuses or stops mucus from draining, including:  Allergies.  Asthma.  Bacterial or viral infection.  Abnormally shaped bones between the nasal passages.  Nasal growths that contain mucus (nasal polyps).  Narrow sinus openings.  Pollutants, such as chemicals or irritants in the air.  A foreign object stuck in the nose.  A fungal infection. This is rare. What increases the risk? The following factors may make you more likely to develop this condition:  Having allergies or asthma.  Having had a recent cold or respiratory tract infection.  Having structural deformities or blockages in your nose or sinuses.  Having a weak immune system.  Doing a lot of swimming or diving.  Overusing nasal sprays.  Smoking. What are the signs or symptoms? The main symptoms of this condition are pain and a  feeling of pressure around the affected sinuses. Other symptoms include:  Upper toothache.  Earache.  Headache.  Bad breath.  Decreased sense of smell and taste.  A cough that may get worse at night.  Fatigue.  Fever.  Thick drainage from your nose. The drainage is often green and it may contain pus (purulent).  Stuffy nose or congestion.  Postnasal drip. This is when extra mucus collects in the throat or back of the nose.  Swelling and warmth over the affected sinuses.  Sore throat.  Sensitivity to light. How is this diagnosed? This condition is diagnosed based on symptoms, a medical history, and a physical exam. To find out if your condition is acute or chronic, your health care provider may:  Look in your nose for signs of nasal polyps.  Tap over the affected sinus to check for signs of infection.  View the inside of your sinuses using an imaging device that has a light attached (endoscope). If your health care provider suspects that you have chronic sinusitis, you may also:  Be tested for allergies.  Have a sample of mucus taken from your nose (nasal culture) and checked for bacteria.  Have a mucus sample examined to see if your sinusitis is related to an allergy. If your sinusitis does not respond to treatment and it lasts longer than 8 weeks, you may have an MRI or CT scan to check your sinuses. These scans also help to determine how severe your infection is. In rare cases, a bone biopsy may be done to rule out more serious types of fungal sinus disease. How  is this treated? Treatment for sinusitis depends on the cause and whether your condition is chronic or acute. If a virus is causing your sinusitis, your symptoms will go away on their own within 10 days. You may be given medicines to relieve your symptoms, including:  Topical nasal decongestants. They shrink swollen nasal passages and let mucus drain from your sinuses.  Antihistamines. These drugs block  inflammation that is triggered by allergies. This can help to ease swelling in your nose and sinuses.  Topical nasal corticosteroids. These are nasal sprays that ease inflammation and swelling in your nose and sinuses.  Nasal saline washes. These rinses can help to get rid of thick mucus in your nose. If your condition is caused by bacteria, you will be given an antibiotic medicine. If your condition is caused by a fungus, you will be given an antifungal medicine. Surgery may be needed to correct underlying conditions, such as narrow nasal passages. Surgery may also be needed to remove polyps. Follow these instructions at home: Medicines  Take, use, or apply over-the-counter and prescription medicines only as told by your health care provider. These may include nasal sprays.  If you were prescribed an antibiotic medicine, take it as told by your health care provider. Do not stop taking the antibiotic even if you start to feel better. Hydrate and Humidify  Drink enough water to keep your urine clear or pale yellow. Staying hydrated will help to thin your mucus.  Use a cool mist humidifier to keep the humidity level in your home above 50%.  Inhale steam for 10-15 minutes, 3-4 times a day or as told by your health care provider. You can do this in the bathroom while a hot shower is running.  Limit your exposure to cool or dry air. Rest  Rest as much as possible.  Sleep with your head raised (elevated).  Make sure to get enough sleep each night. General instructions  Apply a warm, moist washcloth to your face 3-4 times a day or as told by your health care provider. This will help with discomfort.  Wash your hands often with soap and water to reduce your exposure to viruses and other germs. If soap and water are not available, use hand sanitizer.  Do not smoke. Avoid being around people who are smoking (secondhand smoke).  Keep all follow-up visits as told by your health care  provider. This is important. Contact a health care provider if:  You have a fever.  Your symptoms get worse.  Your symptoms do not improve within 10 days. Get help right away if:  You have a severe headache.  You have persistent vomiting.  You have pain or swelling around your face or eyes.  You have vision problems.  You develop confusion.  Your neck is stiff.  You have trouble breathing. This information is not intended to replace advice given to you by your health care provider. Make sure you discuss any questions you have with your health care provider. Document Released: 04/16/2005 Document Revised: 12/11/2015 Document Reviewed: 02/09/2015 Elsevier Interactive Patient Education  2017 [employer].

## 2016-07-13 MED FILL — GABAPENTIN 100 MG CAP: 100 | 30 days supply | Qty: 90 | Fill #0

## 2016-07-13 MED FILL — TAMOXIFEN 20 MG TABLET: 20 | 3 days supply | Qty: 3 | Fill #0 | Status: TO

## 2016-07-16 MED FILL — TAMOXIFEN 20 MG TABLET: 20 | 90 days supply | Qty: 90 | Fill #1 | Status: TO

## 2016-07-17 ENCOUNTER — Ambulatory Visit (INDEPENDENT_AMBULATORY_CARE_PROVIDER_SITE_OTHER): Payer: BLUE CROSS/BLUE SHIELD | Admitting: Family

## 2016-07-17 ENCOUNTER — Encounter: Payer: Self-pay | Admitting: Family

## 2016-07-17 VITALS — BP 122/68 | HR 88 | Temp 98.4°F | Resp 16 | Ht 67.0 in | Wt 187.6 lb

## 2016-07-17 DIAGNOSIS — R635 Abnormal weight gain: Secondary | ICD-10-CM | POA: Diagnosis not present

## 2016-07-17 DIAGNOSIS — I10 Essential (primary) hypertension: Secondary | ICD-10-CM | POA: Diagnosis not present

## 2016-07-17 DIAGNOSIS — Z853 Personal history of malignant neoplasm of breast: Secondary | ICD-10-CM | POA: Diagnosis not present

## 2016-07-17 DIAGNOSIS — J309 Allergic rhinitis, unspecified: Secondary | ICD-10-CM

## 2016-07-17 MED ORDER — MONTELUKAST SODIUM 10 MG PO TABS: 10.0000 mg | ORAL_TABLET | Freq: Every day | ORAL | 0 refills | Status: DC | PRN

## 2016-07-17 MED FILL — MONTELUKAST SOD 10 MG TAB: 10 | 90 days supply | Qty: 90 | Fill #0

## 2016-07-17 NOTE — Progress Notes (Signed)
Pre visit review using our clinic review tool, if applicable. No additional management support is needed unless otherwise documented below in the visit note. 

## 2016-07-17 NOTE — Addendum Note (Signed)
Addended by: Caffie Pinto on: 07/17/2016 10:46 AM   Modules accepted: Orders
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
## 2016-11-07 ENCOUNTER — Other Ambulatory Visit: Payer: Self-pay | Admitting: Family

## 2016-11-07 MED FILL — GABAPENTIN 100 MG CAPSULE: 100 | 30 days supply | Qty: 90 | Fill #1

## 2016-11-07 MED FILL — TAMOXIFEN 20 MG TABLET: 20 | 90 days supply | Qty: 90 | Fill #2 | Status: TO

## 2016-11-07 MED FILL — MONTELUKAST SOD 10 MG TAB: 10 | 90 days supply | Qty: 90 | Fill #0 | Status: TO

## 2016-11-07 MED FILL — CARVEDILOL 3.125 MG TABLET: 3.125 | 90 days supply | Qty: 180 | Fill #2 | Status: TO

## 2016-11-07 MED FILL — LOSARTAN POTASSIUM 25 MG TA: 25 | 90 days supply | Qty: 90 | Fill #2 | Status: TO

## 2017-01-03 ENCOUNTER — Ambulatory Visit (INDEPENDENT_AMBULATORY_CARE_PROVIDER_SITE_OTHER): Payer: BLUE CROSS/BLUE SHIELD | Admitting: Physician Assistant

## 2017-01-03 VITALS — BP 122/84 | HR 73 | Ht 67.0 in | Wt 180.0 lb

## 2017-01-03 DIAGNOSIS — I428 Other cardiomyopathies: Secondary | ICD-10-CM

## 2017-01-03 DIAGNOSIS — I10 Essential (primary) hypertension: Secondary | ICD-10-CM | POA: Diagnosis not present

## 2017-01-03 DIAGNOSIS — R0789 Other chest pain: Secondary | ICD-10-CM | POA: Diagnosis not present

## 2017-01-03 MED ORDER — CARVEDILOL 3.125 MG PO TABS: 3.1250 mg | ORAL_TABLET | Freq: Two times a day (BID) | ORAL | 3 refills | Status: DC

## 2017-01-03 MED ORDER — LOSARTAN POTASSIUM 25 MG PO TABS
25.0000 mg | ORAL_TABLET | Freq: Every day | ORAL | 3 refills | Status: DC
Start: 2017-01-03 — End: 2017-06-19
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
## 2017-01-03 NOTE — Patient Instructions (Addendum)
Medication Instructions:   Your physician recommends that you continue on your current medications as directed. Please refer to the Current Medication list given to you today.   If you need a refill on your cardiac medications before your next appointment, please call your pharmacy.  Labwork: NONE ORDERED  TODAY    Testing/Procedures: NONE ORDERED  TODAY   Follow-Up: Your physician wants you to follow-up in: [place] will receive a reminder letter in the mail two months in advance. If you don't receive a letter, please call our office to schedule the follow-up appointment.     Any Other Special Instructions Will Be Listed Below (If Applicable).

## 2017-01-08 MED FILL — GABAPENTIN 100 MG CAPSULE: 100 | 30 days supply | Qty: 90 | Fill #0 | Status: TO

## 2017-04-15 ENCOUNTER — Ambulatory Visit: Payer: BLUE CROSS/BLUE SHIELD | Admitting: Medical

## 2017-04-15 ENCOUNTER — Other Ambulatory Visit: Payer: Self-pay | Admitting: Family

## 2017-04-15 MED FILL — MONTELUKAST SOD 10 MG TAB: 10 | 90 days supply | Qty: 90 | Fill #0

## 2017-04-19 ENCOUNTER — Ambulatory Visit: Payer: BLUE CROSS/BLUE SHIELD | Admitting: Family

## 2017-04-19 ENCOUNTER — Encounter: Payer: Self-pay | Admitting: Family

## 2017-04-19 VITALS — BP 148/94 | HR 80 | Temp 97.8°F | Resp 18 | Ht 67.0 in | Wt 181.8 lb

## 2017-04-19 DIAGNOSIS — G43909 Migraine, unspecified, not intractable, without status migrainosus: Secondary | ICD-10-CM

## 2017-04-19 MED ORDER — SUMATRIPTAN SUCCINATE 50 MG PO TABS: ORAL_TABLET | ORAL | 0 refills | Status: AC

## 2017-04-19 MED ORDER — AMITRIPTYLINE HCL 25 MG PO TABS: 25.0000 mg | ORAL_TABLET | Freq: Every day | ORAL | 0 refills | Status: AC

## 2017-04-19 MED FILL — SUMATRIPTAN SUCC 50 MG TAB: 50 | 30 days supply | Qty: 18 | Fill #0

## 2017-04-19 MED FILL — AMITRIPTYLINE HCL 25 MG TAB: 25 | 90 days supply | Qty: 90 | Fill #0

## 2017-04-19 NOTE — Patient Instructions (Signed)
Please begin elavil 1 tablet at bedtime. You may use imitrex as needed for break through migraines.
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
## 2017-06-13 ENCOUNTER — Other Ambulatory Visit: Payer: Self-pay | Admitting: Family

## 2017-06-19 ENCOUNTER — Other Ambulatory Visit: Payer: Self-pay | Admitting: *Deleted

## 2017-06-19 MED ORDER — LOSARTAN POTASSIUM 25 MG PO TABS: 25.0000 mg | ORAL_TABLET | Freq: Every day | ORAL | 1 refills | Status: AC

## 2017-06-19 MED ORDER — CARVEDILOL 3.125 MG PO TABS: 3.1250 mg | ORAL_TABLET | Freq: Two times a day (BID) | ORAL | 1 refills | Status: AC
